# Patient Record
Sex: Female | Born: 1940 | Race: White | Hispanic: No | Marital: Married | Smoking: Never smoker
Health system: Southern US, Community
[De-identification: ages and names within clinical notes are randomized; demographics above are authoritative.]

## PROBLEM LIST (undated history)

## (undated) DIAGNOSIS — T386X5A Adverse effect of antigonadotrophins, antiestrogens, antiandrogens, not elsewhere classified, initial encounter: Secondary | ICD-10-CM

## (undated) DIAGNOSIS — R0602 Shortness of breath: Secondary | ICD-10-CM

## (undated) DIAGNOSIS — E559 Vitamin D deficiency, unspecified: Secondary | ICD-10-CM

## (undated) DIAGNOSIS — M818 Other osteoporosis without current pathological fracture: Secondary | ICD-10-CM

## (undated) DIAGNOSIS — IMO0002 Reserved for concepts with insufficient information to code with codable children: Secondary | ICD-10-CM

## (undated) DIAGNOSIS — E079 Disorder of thyroid, unspecified: Secondary | ICD-10-CM

## (undated) DIAGNOSIS — F329 Major depressive disorder, single episode, unspecified: Secondary | ICD-10-CM

## (undated) DIAGNOSIS — E039 Hypothyroidism, unspecified: Secondary | ICD-10-CM

## (undated) DIAGNOSIS — G4700 Insomnia, unspecified: Secondary | ICD-10-CM

## (undated) DIAGNOSIS — C50912 Malignant neoplasm of unspecified site of left female breast: Secondary | ICD-10-CM

## (undated) DIAGNOSIS — F419 Anxiety disorder, unspecified: Secondary | ICD-10-CM

## (undated) DIAGNOSIS — R5383 Other fatigue: Secondary | ICD-10-CM

## (undated) DIAGNOSIS — C7951 Secondary malignant neoplasm of bone: Secondary | ICD-10-CM

## (undated) DIAGNOSIS — R5382 Chronic fatigue, unspecified: Secondary | ICD-10-CM

## (undated) DIAGNOSIS — N1330 Unspecified hydronephrosis: Secondary | ICD-10-CM

## (undated) DIAGNOSIS — Z9221 Personal history of antineoplastic chemotherapy: Secondary | ICD-10-CM

## (undated) DIAGNOSIS — Z923 Personal history of irradiation: Secondary | ICD-10-CM

## (undated) DIAGNOSIS — F32A Depression, unspecified: Secondary | ICD-10-CM

## (undated) DIAGNOSIS — C787 Secondary malignant neoplasm of liver and intrahepatic bile duct: Secondary | ICD-10-CM

## (undated) DIAGNOSIS — IMO0001 Reserved for inherently not codable concepts without codable children: Secondary | ICD-10-CM

## (undated) HISTORY — DX: Personal history of antineoplastic chemotherapy: Z92.21

## (undated) HISTORY — DX: Insomnia, unspecified: G47.00

## (undated) HISTORY — DX: Reserved for concepts with insufficient information to code with codable children: IMO0002

## (undated) HISTORY — DX: Anxiety disorder, unspecified: F41.9

## (undated) HISTORY — DX: Disorder of thyroid, unspecified: E07.9

## (undated) HISTORY — DX: Major depressive disorder, single episode, unspecified: F32.9

## (undated) HISTORY — DX: Other osteoporosis without current pathological fracture: M81.8

## (undated) HISTORY — DX: Personal history of irradiation: Z92.3

## (undated) HISTORY — DX: Adverse effect of antigonadotrophins, antiestrogens, antiandrogens, not elsewhere classified, initial encounter: T38.6X5A

## (undated) HISTORY — DX: Chronic fatigue, unspecified: R53.82

## (undated) HISTORY — DX: Reserved for inherently not codable concepts without codable children: IMO0001

## (undated) HISTORY — DX: Depression, unspecified: F32.A

## (undated) HISTORY — DX: Vitamin D deficiency, unspecified: E55.9

---

## 1968-03-24 HISTORY — PX: CHOLECYSTECTOMY: SHX55

## 1998-07-23 ENCOUNTER — Other Ambulatory Visit: Admission: RE | Admit: 1998-07-23 | Discharge: 1998-07-23 | Payer: Self-pay | Admitting: Family Medicine

## 1999-07-16 ENCOUNTER — Other Ambulatory Visit: Admission: RE | Admit: 1999-07-16 | Discharge: 1999-07-16 | Payer: Self-pay | Admitting: Family Medicine

## 2000-08-13 ENCOUNTER — Other Ambulatory Visit: Admission: RE | Admit: 2000-08-13 | Discharge: 2000-08-13 | Payer: Self-pay | Admitting: Family Medicine

## 2000-08-24 ENCOUNTER — Other Ambulatory Visit: Admission: RE | Admit: 2000-08-24 | Discharge: 2000-08-24 | Payer: Self-pay

## 2000-08-27 ENCOUNTER — Encounter: Admission: RE | Admit: 2000-08-27 | Discharge: 2000-08-27 | Payer: Self-pay

## 2000-08-28 ENCOUNTER — Ambulatory Visit (HOSPITAL_BASED_OUTPATIENT_CLINIC_OR_DEPARTMENT_OTHER): Admission: RE | Admit: 2000-08-28 | Discharge: 2000-08-29 | Payer: Self-pay

## 2000-08-28 ENCOUNTER — Encounter (INDEPENDENT_AMBULATORY_CARE_PROVIDER_SITE_OTHER): Payer: Self-pay | Admitting: *Deleted

## 2000-08-28 HISTORY — PX: OTHER SURGICAL HISTORY: SHX169

## 2000-09-04 ENCOUNTER — Ambulatory Visit: Admission: RE | Admit: 2000-09-04 | Discharge: 2000-12-03 | Payer: Self-pay | Admitting: Radiation Oncology

## 2000-10-20 ENCOUNTER — Ambulatory Visit (HOSPITAL_BASED_OUTPATIENT_CLINIC_OR_DEPARTMENT_OTHER): Admission: RE | Admit: 2000-10-20 | Discharge: 2000-10-20 | Payer: Self-pay

## 2000-12-01 ENCOUNTER — Ambulatory Visit (HOSPITAL_COMMUNITY): Admission: RE | Admit: 2000-12-01 | Discharge: 2000-12-01 | Payer: Self-pay | Admitting: Oncology

## 2000-12-01 ENCOUNTER — Encounter: Payer: Self-pay | Admitting: Oncology

## 2000-12-21 ENCOUNTER — Ambulatory Visit: Admission: RE | Admit: 2000-12-21 | Discharge: 2001-03-21 | Payer: Self-pay | Admitting: Radiation Oncology

## 2001-07-06 ENCOUNTER — Encounter: Admission: RE | Admit: 2001-07-06 | Discharge: 2001-07-06 | Payer: Self-pay | Admitting: Oncology

## 2001-07-06 ENCOUNTER — Encounter: Payer: Self-pay | Admitting: Oncology

## 2002-01-06 ENCOUNTER — Encounter: Payer: Self-pay | Admitting: Oncology

## 2002-01-06 ENCOUNTER — Encounter: Admission: RE | Admit: 2002-01-06 | Discharge: 2002-01-06 | Payer: Self-pay | Admitting: Oncology

## 2002-06-23 ENCOUNTER — Encounter: Admission: RE | Admit: 2002-06-23 | Discharge: 2002-06-23 | Payer: Self-pay | Admitting: Oncology

## 2002-11-02 ENCOUNTER — Ambulatory Visit (HOSPITAL_COMMUNITY): Admission: RE | Admit: 2002-11-02 | Discharge: 2002-11-02 | Payer: Self-pay | Admitting: Oncology

## 2002-11-02 ENCOUNTER — Encounter: Payer: Self-pay | Admitting: Oncology

## 2002-11-09 ENCOUNTER — Ambulatory Visit: Admission: RE | Admit: 2002-11-09 | Discharge: 2002-11-09 | Payer: Self-pay | Admitting: Oncology

## 2003-01-11 ENCOUNTER — Ambulatory Visit (HOSPITAL_COMMUNITY): Admission: RE | Admit: 2003-01-11 | Discharge: 2003-01-11 | Payer: Self-pay | Admitting: Obstetrics and Gynecology

## 2003-01-11 ENCOUNTER — Encounter (INDEPENDENT_AMBULATORY_CARE_PROVIDER_SITE_OTHER): Payer: Self-pay

## 2003-01-11 HISTORY — PX: HYSTEROSCOPY W/D&C: SHX1775

## 2003-03-29 ENCOUNTER — Encounter: Admission: RE | Admit: 2003-03-29 | Discharge: 2003-03-29 | Payer: Self-pay | Admitting: Oncology

## 2003-06-28 ENCOUNTER — Encounter: Admission: RE | Admit: 2003-06-28 | Discharge: 2003-06-28 | Payer: Self-pay | Admitting: Oncology

## 2003-09-29 ENCOUNTER — Other Ambulatory Visit: Admission: RE | Admit: 2003-09-29 | Discharge: 2003-09-29 | Payer: Self-pay | Admitting: Obstetrics and Gynecology

## 2003-12-20 ENCOUNTER — Ambulatory Visit (HOSPITAL_COMMUNITY): Admission: RE | Admit: 2003-12-20 | Discharge: 2003-12-20 | Payer: Self-pay | Admitting: Oncology

## 2004-04-23 ENCOUNTER — Ambulatory Visit: Payer: Self-pay | Admitting: Oncology

## 2004-06-28 ENCOUNTER — Encounter: Admission: RE | Admit: 2004-06-28 | Discharge: 2004-06-28 | Payer: Self-pay | Admitting: Oncology

## 2004-08-16 ENCOUNTER — Ambulatory Visit: Payer: Self-pay | Admitting: Oncology

## 2004-08-29 ENCOUNTER — Encounter: Admission: RE | Admit: 2004-08-29 | Discharge: 2004-08-29 | Payer: Self-pay | Admitting: Obstetrics and Gynecology

## 2004-10-01 ENCOUNTER — Other Ambulatory Visit: Admission: RE | Admit: 2004-10-01 | Discharge: 2004-10-01 | Payer: Self-pay | Admitting: Obstetrics and Gynecology

## 2005-02-18 ENCOUNTER — Ambulatory Visit: Payer: Self-pay | Admitting: Oncology

## 2005-06-30 ENCOUNTER — Encounter: Admission: RE | Admit: 2005-06-30 | Discharge: 2005-06-30 | Payer: Self-pay | Admitting: Oncology

## 2005-07-23 ENCOUNTER — Encounter: Payer: Self-pay | Admitting: Oncology

## 2005-08-14 ENCOUNTER — Ambulatory Visit: Payer: Self-pay | Admitting: Oncology

## 2005-08-19 LAB — COMPREHENSIVE METABOLIC PANEL
ALT: 8 U/L (ref 0–40)
Albumin: 4.2 g/dL (ref 3.5–5.2)
CO2: 26 mEq/L (ref 19–32)
Calcium: 9.4 mg/dL (ref 8.4–10.5)
Chloride: 102 mEq/L (ref 96–112)
Creatinine, Ser: 0.8 mg/dL (ref 0.4–1.2)
Potassium: 4 mEq/L (ref 3.5–5.3)
Sodium: 140 mEq/L (ref 135–145)
Total Protein: 6.2 g/dL (ref 6.0–8.3)

## 2005-08-19 LAB — CBC WITH DIFFERENTIAL/PLATELET
BASO%: 0.2 % (ref 0.0–2.0)
HCT: 36.3 % (ref 34.8–46.6)
LYMPH%: 27 % (ref 14.0–48.0)
MCH: 31.1 pg (ref 26.0–34.0)
MCHC: 34.2 g/dL (ref 32.0–36.0)
MCV: 91 fL (ref 81.0–101.0)
MONO%: 6.8 % (ref 0.0–13.0)
NEUT%: 64.8 % (ref 39.6–76.8)
Platelets: 200 10*3/uL (ref 145–400)
RBC: 3.99 10*6/uL (ref 3.70–5.32)

## 2005-08-19 LAB — TSH: TSH: 0.545 u[IU]/mL (ref 0.350–5.500)

## 2005-08-27 ENCOUNTER — Encounter: Admission: RE | Admit: 2005-08-27 | Discharge: 2005-08-27 | Payer: Self-pay | Admitting: Oncology

## 2006-03-02 ENCOUNTER — Ambulatory Visit: Payer: Self-pay | Admitting: Oncology

## 2006-03-04 LAB — COMPREHENSIVE METABOLIC PANEL
AST: 16 U/L (ref 0–37)
BUN: 15 mg/dL (ref 6–23)
Calcium: 8.7 mg/dL (ref 8.4–10.5)
Chloride: 103 mEq/L (ref 96–112)
Creatinine, Ser: 0.8 mg/dL (ref 0.40–1.20)
Glucose, Bld: 126 mg/dL — ABNORMAL HIGH (ref 70–99)

## 2006-03-04 LAB — CBC WITH DIFFERENTIAL/PLATELET
Basophils Absolute: 0 10*3/uL (ref 0.0–0.1)
EOS%: 3.1 % (ref 0.0–7.0)
Eosinophils Absolute: 0.2 10*3/uL (ref 0.0–0.5)
HCT: 37.7 % (ref 34.8–46.6)
HGB: 12.6 g/dL (ref 11.6–15.9)
MCH: 30.1 pg (ref 26.0–34.0)
MCV: 89.9 fL (ref 81.0–101.0)
NEUT%: 71.1 % (ref 39.6–76.8)
lymph#: 1.7 10*3/uL (ref 0.9–3.3)

## 2006-07-02 ENCOUNTER — Encounter: Admission: RE | Admit: 2006-07-02 | Discharge: 2006-07-02 | Payer: Self-pay | Admitting: Oncology

## 2006-08-30 ENCOUNTER — Ambulatory Visit: Payer: Self-pay | Admitting: Oncology

## 2006-09-02 LAB — COMPREHENSIVE METABOLIC PANEL
ALT: 9 U/L (ref 0–35)
AST: 14 U/L (ref 0–37)
CO2: 25 mEq/L (ref 19–32)
Creatinine, Ser: 0.83 mg/dL (ref 0.40–1.20)
Sodium: 140 mEq/L (ref 135–145)
Total Bilirubin: 0.5 mg/dL (ref 0.3–1.2)
Total Protein: 6.4 g/dL (ref 6.0–8.3)

## 2006-09-02 LAB — CBC WITH DIFFERENTIAL/PLATELET
BASO%: 0.4 % (ref 0.0–2.0)
EOS%: 1.5 % (ref 0.0–7.0)
LYMPH%: 30.5 % (ref 14.0–48.0)
MCH: 31.1 pg (ref 26.0–34.0)
MCHC: 34.8 g/dL (ref 32.0–36.0)
MONO#: 0.4 10*3/uL (ref 0.1–0.9)
Platelets: 205 10*3/uL (ref 145–400)
RBC: 4.26 10*6/uL (ref 3.70–5.32)
WBC: 5.2 10*3/uL (ref 3.9–10.0)
lymph#: 1.6 10*3/uL (ref 0.9–3.3)

## 2006-10-05 ENCOUNTER — Other Ambulatory Visit: Admission: RE | Admit: 2006-10-05 | Discharge: 2006-10-05 | Payer: Self-pay | Admitting: Obstetrics and Gynecology

## 2007-02-26 ENCOUNTER — Ambulatory Visit: Payer: Self-pay | Admitting: Oncology

## 2007-03-02 LAB — COMPREHENSIVE METABOLIC PANEL
ALT: 9 U/L (ref 0–35)
AST: 15 U/L (ref 0–37)
Alkaline Phosphatase: 42 U/L (ref 39–117)
BUN: 14 mg/dL (ref 6–23)
Chloride: 104 mEq/L (ref 96–112)
Creatinine, Ser: 0.83 mg/dL (ref 0.40–1.20)
Total Bilirubin: 0.6 mg/dL (ref 0.3–1.2)

## 2007-03-02 LAB — CBC WITH DIFFERENTIAL/PLATELET
BASO%: 0.5 % (ref 0.0–2.0)
EOS%: 1.6 % (ref 0.0–7.0)
HCT: 37.3 % (ref 34.8–46.6)
LYMPH%: 30.9 % (ref 14.0–48.0)
MCH: 31.1 pg (ref 26.0–34.0)
MCHC: 34.4 g/dL (ref 32.0–36.0)
MCV: 90.5 fL (ref 81.0–101.0)
MONO#: 0.3 10*3/uL (ref 0.1–0.9)
MONO%: 6.5 % (ref 0.0–13.0)
NEUT%: 60.5 % (ref 39.6–76.8)
Platelets: 217 10*3/uL (ref 145–400)
RBC: 4.12 10*6/uL (ref 3.70–5.32)
WBC: 5.1 10*3/uL (ref 3.9–10.0)

## 2007-07-05 ENCOUNTER — Encounter: Admission: RE | Admit: 2007-07-05 | Discharge: 2007-07-05 | Payer: Self-pay | Admitting: Oncology

## 2007-08-12 ENCOUNTER — Ambulatory Visit: Payer: Self-pay | Admitting: Oncology

## 2007-08-30 ENCOUNTER — Encounter: Admission: RE | Admit: 2007-08-30 | Discharge: 2007-08-30 | Payer: Self-pay | Admitting: Oncology

## 2007-10-11 ENCOUNTER — Other Ambulatory Visit: Admission: RE | Admit: 2007-10-11 | Discharge: 2007-10-11 | Payer: Self-pay | Admitting: Obstetrics and Gynecology

## 2008-02-28 ENCOUNTER — Ambulatory Visit: Payer: Self-pay | Admitting: Oncology

## 2008-03-01 LAB — COMPREHENSIVE METABOLIC PANEL
AST: 13 U/L (ref 0–37)
Alkaline Phosphatase: 52 U/L (ref 39–117)
BUN: 15 mg/dL (ref 6–23)
Creatinine, Ser: 0.84 mg/dL (ref 0.40–1.20)

## 2008-03-01 LAB — CBC WITH DIFFERENTIAL/PLATELET
Basophils Absolute: 0 10*3/uL (ref 0.0–0.1)
EOS%: 2 % (ref 0.0–7.0)
Eosinophils Absolute: 0.1 10*3/uL (ref 0.0–0.5)
HGB: 13.1 g/dL (ref 11.6–15.9)
LYMPH%: 25.4 % (ref 14.0–48.0)
MCH: 30.7 pg (ref 26.0–34.0)
MCV: 90.3 fL (ref 81.0–101.0)
MONO%: 6.8 % (ref 0.0–13.0)
NEUT#: 3.2 10*3/uL (ref 1.5–6.5)
Platelets: 201 10*3/uL (ref 145–400)
RDW: 13.4 % (ref 11.3–14.5)

## 2008-07-05 ENCOUNTER — Encounter: Admission: RE | Admit: 2008-07-05 | Discharge: 2008-07-05 | Payer: Self-pay | Admitting: Oncology

## 2008-09-06 ENCOUNTER — Ambulatory Visit: Payer: Self-pay | Admitting: Oncology

## 2008-09-08 LAB — COMPREHENSIVE METABOLIC PANEL
AST: 15 U/L (ref 0–37)
Albumin: 4.1 g/dL (ref 3.5–5.2)
Alkaline Phosphatase: 52 U/L (ref 39–117)
BUN: 18 mg/dL (ref 6–23)
Glucose, Bld: 95 mg/dL (ref 70–99)
Potassium: 4.1 mEq/L (ref 3.5–5.3)
Sodium: 141 mEq/L (ref 135–145)
Total Bilirubin: 0.4 mg/dL (ref 0.3–1.2)
Total Protein: 6.5 g/dL (ref 6.0–8.3)

## 2008-09-08 LAB — CBC WITH DIFFERENTIAL/PLATELET
EOS%: 1.8 % (ref 0.0–7.0)
LYMPH%: 23.1 % (ref 14.0–49.7)
MCH: 31.8 pg (ref 25.1–34.0)
MCV: 91.6 fL (ref 79.5–101.0)
MONO%: 6.5 % (ref 0.0–14.0)
Platelets: 220 10*3/uL (ref 145–400)
RBC: 4.05 10*6/uL (ref 3.70–5.45)
RDW: 13.8 % (ref 11.2–14.5)

## 2009-03-02 ENCOUNTER — Ambulatory Visit: Payer: Self-pay | Admitting: Oncology

## 2009-03-06 LAB — CBC WITH DIFFERENTIAL/PLATELET
Basophils Absolute: 0 10*3/uL (ref 0.0–0.1)
Eosinophils Absolute: 0.1 10*3/uL (ref 0.0–0.5)
HGB: 13.3 g/dL (ref 11.6–15.9)
MONO#: 0.4 10*3/uL (ref 0.1–0.9)
NEUT#: 3.3 10*3/uL (ref 1.5–6.5)
RBC: 4.22 10*6/uL (ref 3.70–5.45)
RDW: 13.5 % (ref 11.2–14.5)
WBC: 5.1 10*3/uL (ref 3.9–10.3)
lymph#: 1.3 10*3/uL (ref 0.9–3.3)

## 2009-03-07 LAB — COMPREHENSIVE METABOLIC PANEL
ALT: 11 U/L (ref 0–35)
AST: 14 U/L (ref 0–37)
Albumin: 4.4 g/dL (ref 3.5–5.2)
Alkaline Phosphatase: 54 U/L (ref 39–117)
BUN: 15 mg/dL (ref 6–23)
Calcium: 9.7 mg/dL (ref 8.4–10.5)
Chloride: 104 mEq/L (ref 96–112)
Potassium: 4 mEq/L (ref 3.5–5.3)
Sodium: 142 mEq/L (ref 135–145)
Total Protein: 6.4 g/dL (ref 6.0–8.3)

## 2009-07-06 ENCOUNTER — Encounter: Admission: RE | Admit: 2009-07-06 | Discharge: 2009-07-06 | Payer: Self-pay | Admitting: Oncology

## 2009-07-10 ENCOUNTER — Encounter: Admission: RE | Admit: 2009-07-10 | Discharge: 2009-07-10 | Payer: Self-pay | Admitting: Oncology

## 2009-08-30 ENCOUNTER — Ambulatory Visit: Payer: Self-pay | Admitting: Oncology

## 2009-09-03 LAB — CBC WITH DIFFERENTIAL/PLATELET
Basophils Absolute: 0 10*3/uL (ref 0.0–0.1)
EOS%: 2.4 % (ref 0.0–7.0)
HGB: 12.9 g/dL (ref 11.6–15.9)
MCH: 30.9 pg (ref 25.1–34.0)
NEUT#: 3.4 10*3/uL (ref 1.5–6.5)
RDW: 13.3 % (ref 11.2–14.5)
lymph#: 1.3 10*3/uL (ref 0.9–3.3)

## 2009-09-03 LAB — COMPREHENSIVE METABOLIC PANEL
ALT: 9 U/L (ref 0–35)
AST: 15 U/L (ref 0–37)
Albumin: 4.1 g/dL (ref 3.5–5.2)
BUN: 17 mg/dL (ref 6–23)
Calcium: 9.7 mg/dL (ref 8.4–10.5)
Chloride: 104 mEq/L (ref 96–112)
Potassium: 4.3 mEq/L (ref 3.5–5.3)
Total Protein: 6.2 g/dL (ref 6.0–8.3)

## 2010-01-09 ENCOUNTER — Encounter: Admission: RE | Admit: 2010-01-09 | Discharge: 2010-01-09 | Payer: Self-pay | Admitting: Oncology

## 2010-03-04 ENCOUNTER — Ambulatory Visit: Payer: Self-pay | Admitting: Oncology

## 2010-03-05 LAB — CBC WITH DIFFERENTIAL/PLATELET
Basophils Absolute: 0 10*3/uL (ref 0.0–0.1)
Eosinophils Absolute: 0.1 10*3/uL (ref 0.0–0.5)
HGB: 13.2 g/dL (ref 11.6–15.9)
MCV: 92.3 fL (ref 79.5–101.0)
MONO#: 0.4 10*3/uL (ref 0.1–0.9)
MONO%: 5.6 % (ref 0.0–14.0)
NEUT#: 4.4 10*3/uL (ref 1.5–6.5)
RDW: 13.3 % (ref 11.2–14.5)

## 2010-03-05 LAB — COMPREHENSIVE METABOLIC PANEL
Albumin: 4.4 g/dL (ref 3.5–5.2)
Alkaline Phosphatase: 63 U/L (ref 39–117)
BUN: 18 mg/dL (ref 6–23)
CO2: 26 mEq/L (ref 19–32)
Calcium: 9.6 mg/dL (ref 8.4–10.5)
Chloride: 102 mEq/L (ref 96–112)
Glucose, Bld: 121 mg/dL — ABNORMAL HIGH (ref 70–99)
Potassium: 4 mEq/L (ref 3.5–5.3)

## 2010-03-20 ENCOUNTER — Encounter
Admission: RE | Admit: 2010-03-20 | Discharge: 2010-03-20 | Payer: Self-pay | Source: Home / Self Care | Attending: Internal Medicine | Admitting: Internal Medicine

## 2010-04-13 ENCOUNTER — Other Ambulatory Visit: Payer: Self-pay | Admitting: Oncology

## 2010-04-13 DIAGNOSIS — Z1231 Encounter for screening mammogram for malignant neoplasm of breast: Secondary | ICD-10-CM

## 2010-07-12 ENCOUNTER — Ambulatory Visit
Admission: RE | Admit: 2010-07-12 | Discharge: 2010-07-12 | Disposition: A | Discharge status: Home / Self Care | Payer: MEDICARE | Source: Ambulatory Visit | Attending: Oncology | Admitting: Oncology

## 2010-07-12 DIAGNOSIS — Z1231 Encounter for screening mammogram for malignant neoplasm of breast: Secondary | ICD-10-CM

## 2010-08-09 NOTE — Op Note (Signed)
[place]. [facility]  Patient:    Kelli [name], Kelli [name]                     MRN: [id number] Proc. Date: 10/20/00 Attending:  Zigmund Daniel, M.D.                           Operative Report  PREOPERATIVE DIAGNOSIS:  Left breast cancer.  POSTOPERATIVE DIAGNOSIS:  Left breast cancer.  OPERATION PERFORMED:  Implantation of a P.A.S. port via the right brachial vein.  SURGEON:  Zigmund Daniel, M.D.  ANESTHESIA:  Local sedation.  DESCRIPTION OF PROCEDURE:  After the patient was adequately monitored and sedated and had routine preparation and draping of the right arm, I made a short longitudinal incision in the medial distal arm and dissected down toward the brachial vein.  I encountered a nice straight sizable vein which was obviously patent.  I controlled it proximally and tied it off distally.  I made a short incision into the vein and passed the venous tubing in for an estimated necessary length to reach the superior vena cava.  I then used fluoroscopic control to position the tip at about the cavoatrial junction and I tied down on the proximal control to secure it in place.  I then made a small pocket adjacent to the incision and implanted the port with comfortable orientation on the anterior distal right arm.  I sutured that down with two sutures of 4-0 Vicryl and it seemed secure.  I then cut the tubing to the needed length and attached the tubing to the port.  It flushed easily and provided good return of blood.  I then flushed it with concentrated heparin solution.  I closed the incision with deeper subcutaneous 4-0 Vicryl and intercuticular 4-0 Vicryl and Steri-Strips.  The patient tolerated the procedure well.  Chest x-ray in PACU is pending. DD:  10/20/00 TD:  10/20/00 Job: [id number]

## 2010-08-09 NOTE — Op Note (Signed)
NAME:  Kelli [name], Kelli [name]                      ACCOUNT NO.:  [id number]   MEDICAL RECORD NO.:  [id number]                   PATIENT TYPE:  AMB   LOCATION:  SDC                                  FACILITY:  [facility]   PHYSICIAN:  Artist Pais, M.D.                 DATE OF BIRTH:  Jul 14, 1940   DATE OF PROCEDURE:  01/11/2003  DATE OF DISCHARGE:                                 OPERATIVE REPORT   PREOPERATIVE DIAGNOSIS:  Endometrial polyp seen on sonohysterogram.   POSTOPERATIVE DIAGNOSIS:  Endometrial polyp seen on sonohysterogram.  The  patient was found to have a small polypoid area at the junction of the  internal os and lower uterine segment, which was removed in its entirety.   PROCEDURES:  1. Dilatation and curettage.  2. Hysteroscopy.  3. Polypectomy.   SURGEON:  Artist Pais, M.D.   ANESTHESIA:  General by LMA plus 20 mL 1% lidocaine paracervical block for  postoperative comfort.   ESTIMATED BLOOD LOSS:  Minimal.   FLUIDS REPLACED:  1700 mL of crystalloid.   COMPLICATIONS:  None.   DRAINS:  None.   DESCRIPTION OF OPERATION:  The patient was brought to the operating room,  identified on the operating room table.  After induction of adequate general  anesthesia by LMA, the patient was placed in the dorsal lithotomy position  and prepped and draped in the usual sterile fashion.  The bladder was  straight-catheterized for just a few drops of clear yellow urine.  A  bimanual examination revealed the uterus to be retroverted and small and  mobile.  A speculum was placed and the posterior lip of the cervix was  infiltrated with 1 mL of 1% lidocaine and then grasped with a single-tooth  tenaculum.  The remaining 19 mL of lidocaine was placed for a paracervical  block.  The cervix was then very gently dilated up to a #25 Pratt dilator  without any difficulty.  Dilatation proceeded very carefully in order to  decrease the risk of uterine perforation.  The os was noted to  deviate  slightly to the left but dilatation proceeded very gently.  The hysteroscope  was placed and using sorbitol as a distending medium, a careful and thorough  hysteroscopic examination was performed.  The entire endometrium appeared  thin, and there was noted to be a small polypoid area at the junction of the  internal os and the lower uterine segment.  At that point the scope was  withdrawn and the endometrium was curetted in a systematic clockwise  fashion.  This was done very carefully to decrease the risk of perforation.  I was able to remove the polyp in its entirety with the serrated curette,  and this was identified when it was removed.  The Randall stone forceps were  placed and additional tissue was obtained.  Entire endometrial cavity was  curetted until a good cry was heard all around.  I did not re-examine the  endometrium as it was noted to be very thin and I had already identified the  polyp upon removal and there had been no additional tissue noted on  hysteroscopy.  It was this surgeon's opinion that to continue to instrument  the patient would increase the risk of a possible complication and was not  deemed to be necessary as the polyp was identified.  At that point the  procedure was then terminated.  The tenaculum was then removed.  I placed  pressure on the tenaculum site to decrease bleeding and subsequently the  procedure was terminated.  The patient was transferred to the recovery room  in stable condition after all instrument, sponge, and needle counts were  correct.  She was given a postoperative D&C instruction sheet and ordered to  return in two to three weeks for a postoperative evaluation.                                               Artist Pais, M.D.    DC/MEDQ  D:  01/11/2003  T:  01/12/2003  Job:  [id number]   cc:   Windle Guard, M.D.  [address]  [place], [place] [address]  Fax: [phone]   Lennis P. Darrold Span, M.D.  [address]  [facility]  [place]  [place] [address]  Fax: [phone]

## 2010-08-09 NOTE — Op Note (Signed)
[place]. [facility]  Patient:    Kelli [name], Kelli [name]                   MRN: [id number] Proc. Date: 08/28/00 Adm. Date:  [id number] Disc. Date: [id number] Attending:  Meredith Leeds                           Operative Report  PREOPERATIVE DIAGNOSIS:  Carcinoma of the left breast.  POSTOPERATIVE DIAGNOSIS:  Carcinoma of the left breast.  PROCEDURES: 1. Left partial mastectomy. 2. Sentinel lymph node biopsy.  SURGEON:  Zigmund Daniel, M.D.  ANESTHESIA:  General.  DESCRIPTION OF PROCEDURE:  After the patient had adequate monitoring and underwent general anesthesia and routine preparation and draping of the left breast and axilla and the right breast as well, I first excised a tiny cystic lesion from the skin and subcutaneous tissue of the right breast, got hemostasis, and closed the skin with intracuticular 4-0 Vicryl.  I did not reapproach that area thereafter.  Prior to patient having the prep, I had injected lymphazurin blue dye in the subareolar portion of the left breast. Using the gamma probe, I found a hot spot in the low axilla and made a short incision over it and dissected down to the area and found a couple of lymph nodes which had increased radioactivity and which had blue dye in them.  I excised them and found that the background radiation was then present, and there seemed to be no other hot spots.  I proceeded with the partial mastectomy while the lymph nodes were analyzed.  I made a circumareolar incision paralleling the border of the areola but out from it, immediately over the palpable tumor in the upper outer quadrant, dissected down into the subcutaneous tissues, and developed the skin and subcutaneous flaps for several centimeters in all directions over the tumor.  I then deepened my dissection down to the pectoral fascia at all spots and excised most of the tissue of the upper outer quadrant of the left breast.  I got  hemostasis with the cautery.  I marked the specimen with sutures to orient it for the pathologist and then closed the skin with staples.  While I was performing that part of the operation, I got the report back from Dr. Debby Bud in pathology, who said that there were malignant cells in one of the lymph nodes and atypical cells in the other.  I then decided to perform an axillary lymphadenectomy.  I enlarged the axillary incision, deepened the dissection down to the chest wall in the upper outer quadrant.  I dissect up along the lateral border of the pectoralis major and encountered the axillary vein and freed the axillary contents up from the superficial axillary skin.  I clipped and divided a couple of tributaries of the axillary vein.  Then I identified and spared the long thoracic and thoracodorsal nerves and swept all the axillary contents downward.  I sacrificed the lateral intercostal brachial nerve and a small cutaneous nerve to the arm.  I stuck close to the chest wall but made sure that I did not injure the long thoracic nerve as I took out the axillary contents.  I did palpate a couple of somewhat firm lymph nodes in the axilla.  After removal of the axillary contents, I got good hemostasis with the cautery, irrigated the area, placed a suction drain through a separate stab incision, and closed  the incision with a layer of 3-0 Vicryl and closed the skin with staples.  She tolerated the operation well. DD:  08/28/00 TD:  08/30/00 Job: [id number]

## 2010-08-09 NOTE — H&P (Signed)
NAME:  Kelli [name], Kelli [name]                      ACCOUNT NO.:  [id number]   MEDICAL RECORD NO.:  [id number]                   PATIENT TYPE:  AMB   LOCATION:  SDC                                  FACILITY:  [facility]   PHYSICIAN:  Artist Pais, M.D.                 DATE OF BIRTH:  Jan 02, 1941   DATE OF ADMISSION:  01/11/2003  DATE OF DISCHARGE:                                HISTORY & PHYSICAL   CURRENT HISTORY:  The patient is a 70 year old Caucasian female, who is  admitted for a D&C hysteroscopy and polypectomy.  She was diagnosed with  left breast cancer in June 2002, which was [facility] receptor positive.  Her  breast cancer was treated with chemotherapy and radiation therapy.  She  remains on Tamoxifen.  She has not had any bleeding.   I did offer her a pelvic ultrasound, explaining that if the patient has one  glandular cancer she could have others.  She subsequently underwent a pelvic  ultrasound, which showed cystic areas within the endometrial cavity --  consistent with Tamoxifen use.  She is also found to have small fibroids.   Subsequently, she underwent a sonohystogram.  This showed a 7 x 6 mm  echogenic focus in the posterior wall of the endometrium, consistent with a  likely polyp.  She was advised to undergo a D&C hysteroscopy and  polypectomy.  Risks of surgery, including anesthetic complication,  hemorrhage, infection, damage to adjacent structures (including bladder,  bowel, blood vessels or ureters) were discussed with the patient.  She was  made aware of the risks of uterine perforation, which could result in  overwhelming life threatening hemorrhage requiring emergent hysterectomy;  or, uterine perforation which could result in damage, which could require an  emergent colostomy or which could result in overwhelming life-threatening  peritonitis.  She expressed understanding of, and acceptance of, these  risks.  She desires to proceed with surgery.   I did call Dr. Windle Guard, who is her primary care physician, to ensure  that she could be surgically cleared for this procedure.  But, he has not  seen her for two years.  When I have seen her in the office she continues to  tell me he is her primary care physician.  She saw him for a viral illness,  but has not seen him for a physical.  I did speak with the anesthesiologist  at [facility], and they will interview her to ensure that this would  be safe to perform.  This is a minor surgery, and she has no significant  health problems.   OBSTETRICAL AND GYNECOLOGIC HISTORY:  No postmenopausal bleeding.  On  Tamoxifen.   PAST MEDICAL HISTORY:  1. Depression.  2. Hypothyroidism, managed by Dr. Romero Belling.  3. History of left breast cancer.  4. Insomnia.  5. Menopausal symptoms.  6. Lichen sclerosis.   ALLERGIES:  SULFA, MYCINS.  CURRENT MEDICATIONS:  1. Ambien.  2. Ativan.  3. Catapres patch for hot flashes.  4. Calcium.  5. Synthroid.  6. Tamoxifen.  7. Prozac.   SURGERIES:  1. Cholecystectomy.  2. Lumpectomy.  3. Placement of Port-a-Cath July 2002.   FAMILY HISTORY:  There is no family history of colon, breast, ovarian or  prostate cancer.  The patient's mother is 67.  Father died at 84 of a CVA.  She has one sister, age 76, with hypertension, heart disease, diabetes and a  CVA.  One sister, age 65, with  diabetes.  Another sister, age 68, alive and  well.  She has two children, ages 78 and 64, alive and well.   SOCIAL HISTORY:  The patient is retired.  She does not smoke or drink.   REVIEW OF SYSTEMS:  Noncontributory, except as noted above.  Denies  headaches, visual changes, chest pain, shortness of breath, abdominal pain,  change in bowel habits, unintentional weight loss, dysuria, urgency or  frequency, vaginal pruritus or discharge, pain or bleeding with intercourse.   PHYSICAL EXAMINATION:  VITAL SIGNS:  Blood pressure 130/82, heart rate 72,  weight 136.  HEENT:   Normal.  NECK:  Supple without thyromegaly.  LUNGS:  Clear to auscultation.  CARDIAC:  Regular rate and rhythm.  ABDOMEN:  Soft and nontender, no hepatosplenomegaly or masses.  BREASTS:  Right breast without masses.  No nodes, nipple retraction or  discharge.  Left breast with a well-healed biopsy scar.  EXTREMITIES:  No clubbing, cyanosis or edema.  NEUROLOGIC:  Oriented x3.  PELVIC:  Grossly normal.  Normal external female genitalia.  No vulvar,  vaginal or cervical lesions.  Pap smear cytobrush was performed and found to  be within normal limits except _________.  Bimanual examination reveals the  uterus to be normal, mobile and nontender; without any adnexal masses  palpated.  RECTAL:  Reveals no masses.  Excellent sphincter tone.   LABORATORY DATA:  Please see laboratory data in the chart.   IMPRESSION AND PLAN:  The patient is a 70 year old Caucasian female, who  appears to have an endometrial polyp on sonohystogram -- after undergoing a  routine pelvic ultrasound.  She is admitted for a D&C hysteroscopy and  polypectomy.  The risks and benefits have been explained to her, and she  expresses understanding of and acceptance of these risks.                                               Artist Pais, M.D.    DC/MEDQ  D:  01/11/2003  T:  01/11/2003  Job:  [id number]

## 2011-03-12 ENCOUNTER — Telehealth: Payer: Self-pay | Admitting: Oncology

## 2011-03-12 NOTE — Telephone Encounter (Signed)
gve the pt her jan 2013 appt calendar °

## 2011-04-11 ENCOUNTER — Other Ambulatory Visit: Payer: Medicare Other | Admitting: Lab

## 2011-04-11 ENCOUNTER — Ambulatory Visit: Payer: Medicare Other | Admitting: Oncology

## 2011-04-22 ENCOUNTER — Telehealth: Payer: Self-pay | Admitting: Oncology

## 2011-04-22 ENCOUNTER — Ambulatory Visit (HOSPITAL_BASED_OUTPATIENT_CLINIC_OR_DEPARTMENT_OTHER): Payer: Medicare Other | Admitting: Lab

## 2011-04-22 ENCOUNTER — Other Ambulatory Visit: Payer: Self-pay

## 2011-04-22 ENCOUNTER — Ambulatory Visit (HOSPITAL_BASED_OUTPATIENT_CLINIC_OR_DEPARTMENT_OTHER): Payer: Medicare Other | Admitting: Oncology

## 2011-04-22 VITALS — BP 122/75 | HR 77 | Temp 98.0°F | Ht 64.0 in | Wt 140.6 lb

## 2011-04-22 DIAGNOSIS — Z9221 Personal history of antineoplastic chemotherapy: Secondary | ICD-10-CM

## 2011-04-22 DIAGNOSIS — E039 Hypothyroidism, unspecified: Secondary | ICD-10-CM

## 2011-04-22 DIAGNOSIS — G4700 Insomnia, unspecified: Secondary | ICD-10-CM

## 2011-04-22 DIAGNOSIS — Z1231 Encounter for screening mammogram for malignant neoplasm of breast: Secondary | ICD-10-CM

## 2011-04-22 DIAGNOSIS — Z923 Personal history of irradiation: Secondary | ICD-10-CM

## 2011-04-22 DIAGNOSIS — M949 Disorder of cartilage, unspecified: Secondary | ICD-10-CM

## 2011-04-22 DIAGNOSIS — C50919 Malignant neoplasm of unspecified site of unspecified female breast: Secondary | ICD-10-CM

## 2011-04-22 DIAGNOSIS — Z853 Personal history of malignant neoplasm of breast: Secondary | ICD-10-CM

## 2011-04-22 DIAGNOSIS — C50419 Malignant neoplasm of upper-outer quadrant of unspecified female breast: Secondary | ICD-10-CM

## 2011-04-22 LAB — CBC WITH DIFFERENTIAL/PLATELET
BASO%: 0.4 % (ref 0.0–2.0)
MCHC: 33.8 g/dL (ref 31.5–36.0)
MONO#: 0.5 10*3/uL (ref 0.1–0.9)
RBC: 3.76 10*6/uL (ref 3.70–5.45)
RDW: 15.5 % — ABNORMAL HIGH (ref 11.2–14.5)
WBC: 7.4 10*3/uL (ref 3.9–10.3)
lymph#: 1.9 10*3/uL (ref 0.9–3.3)

## 2011-04-22 LAB — COMPREHENSIVE METABOLIC PANEL
ALT: <8 U/L (ref 0–35)
CO2: 27 mEq/L (ref 19–32)
Calcium: 9.5 mg/dL (ref 8.4–10.5)
Chloride: 104 mEq/L (ref 96–112)
Sodium: 140 mEq/L (ref 135–145)
Total Bilirubin: 0.4 mg/dL (ref 0.3–1.2)
Total Protein: 6.3 g/dL (ref 6.0–8.3)

## 2011-04-22 MED ORDER — FLUOXETINE HCL 20 MG PO CAPS: 20.0000 mg | ORAL_CAPSULE | Freq: Every day | ORAL | Status: DC

## 2011-04-22 MED ORDER — ZOLPIDEM TARTRATE 5 MG PO TABS: 5.0000 mg | ORAL_TABLET | Freq: Every evening | ORAL | Status: DC | PRN

## 2011-04-22 NOTE — Patient Instructions (Signed)
Your hemoglobin is still fine, just a little lower than it was Dec 2011. We will repeat blood counts in 6 months. Call Dr.Livesay's office for results if you don't hear back from [place] within a few days of that lab work.  Mammogram late April

## 2011-04-22 NOTE — Progress Notes (Signed)
OFFICE PROGRESS NOTE Date of Visit  04-22-2011 Physicians:W.Elkins, M.Altheimer, J.Beekman, J.Rendell  INTERVAL HISTORY:   Patient is seen, together with husband, in yearly follow up of her history of breast cancer. History is of 13 node positive left breast cancer in June 2002, treated with lumpectomy and axillary node evaluation, adjuvant adriamycin/cytoxan followed by [facility] chemotherapy, local radiation, tamoxifen from April 2003 thru Dec 2004 and arimidex from Dec 2004 thru June 2011. She has not had known active disease since completiion of that treatment. Last mammograms were at [facility] 07-12-2010 and will be due again 06-2011. She has osteoporosis by bone density scan April 2011. Patient tells me that she has been doing well since she was here last, with no complaints that seem referable to the breast cancer history or that treatment. She is now followed by Dr.Altheimer for endocrine, with recent adjustment in her thyroid replacement which has seemed helpful. She does not have regular follow up with primary physician Dr.Elkins. She had cortisone injection to knee by Dr.Beekman x1, helpful at that time. She recalls that she had colonoscopy by [facility] in last few years that was fine. She has not wanted flu shot, offered and refused again now. She has had viral type URI recently which is nearly resolved. She has no lower respiratory symptoms, no new or different pain, good appetite, energy better on present thyroid, no noted bleeding, no change in bowels, no noted change in breast self exam. Remainder of 10 point Review of Systems negative.  Patient's mother is now 12 years old, in nursing facility.  Objective:  Vital signs in last 24 hours:  BP 122/75  Pulse 77  Temp(Src) 98 F (36.7 C) (Oral)  Ht 5\' 4"  (1.626 m)  Wt 140 lb 9.6 oz (63.776 kg)  BMI 24.13 kg/m2 Easily mobile, looks comfortable, very pleasant as always.    HEENT:mucous membranes moist, pharynx normal without  lesions.PERRL.  LymphaticsCervical, supraclavicular, and axillary nodes normal.No inguinal adenopathy Resp: clear to auscultation bilaterally and normal percussion bilaterally. Back nontender. Cardio: regular rate and rhythm GI: soft, non-tender; bowel sounds normal; no masses,  no organomegaly Extremities: extremities normal, atraumatic, no cyanosis or edema Neuro:no sensory deficits noted Breasts: left lumpectomy scar in superior breast unremarkable, otherwise bilaterally no dominant masses, no skin changes of concern, no nipple changes, nothing in either axilla, no swelling LUE. Skin not pale.  Lab Results:   Basename 04/22/11 1121  WBC 7.4  HGB 11.8  HCT 34.8  PLT 168  ANC 4.8. MCV 92.5, RDW 15.5,  Differential not remarkable  This hgb is down from 13.22 Feb 2010 and 12.30 August 2009 BMET CMET available after visit normal with exception of glucose of 101, including creat 0.85, normal LFTs, Tbili 0.4  Basename 04/22/11 1121  NA 140  K 4.1  CL 104  CO2 27  GLUCOSE 101*  BUN 14  CREATININE 0.85  CALCIUM 9.5    Studies/Results:  No results found.  Medications: I have reviewed the patient's current medications. I have refilled ambien and prozac, as she has not been to primary MD.  Assessment/Plan:  1.History of 13 node positive left breast cancer post treatment as above, now out 10 1/2 years from diagnosis and no known active disease. She will have mammograms in April and I will see her back in a year or sooner if needed 2.Slight decrease in hemoglobin, tho still in normal range and other counts fine: as she does not have scheduled labs with other MDs known now,  I have set up repeat CBC in 6 months here. She seems to be up to date on colonoscopy and no other reason obvious from labs as above. 3.hypothyroidism on replacement        Emrik Erhard P, MD   04/22/2011, 7:30 PM

## 2011-04-22 NOTE — Telephone Encounter (Signed)
FAXED ORDER FORM FOR PROZAC TO [phone].  SENT A COPY OF SIGNED PRESCRIPTION TO BE SCANNED INTO PT.'S EMR.

## 2011-04-22 NOTE — Telephone Encounter (Signed)
Gv pt appts for july-jan2014.  scheduled pt for mammogram on 07/15/2011 @ [facility]

## 2011-07-09 ENCOUNTER — Encounter (HOSPITAL_COMMUNITY): Payer: Self-pay | Admitting: Emergency Medicine

## 2011-07-09 ENCOUNTER — Emergency Department (HOSPITAL_COMMUNITY)
Admission: EM | Admit: 2011-07-09 | Discharge: 2011-07-09 | Disposition: A | Discharge status: Home / Self Care | Payer: Medicare Other | Attending: Emergency Medicine | Admitting: Emergency Medicine

## 2011-07-09 DIAGNOSIS — X30XXXA Exposure to excessive natural heat, initial encounter: Secondary | ICD-10-CM | POA: Insufficient documentation

## 2011-07-09 DIAGNOSIS — R079 Chest pain, unspecified: Secondary | ICD-10-CM | POA: Insufficient documentation

## 2011-07-09 DIAGNOSIS — R55 Syncope and collapse: Secondary | ICD-10-CM | POA: Insufficient documentation

## 2011-07-09 DIAGNOSIS — T675XXA Heat exhaustion, unspecified, initial encounter: Secondary | ICD-10-CM

## 2011-07-09 DIAGNOSIS — R231 Pallor: Secondary | ICD-10-CM | POA: Insufficient documentation

## 2011-07-09 DIAGNOSIS — Z853 Personal history of malignant neoplasm of breast: Secondary | ICD-10-CM | POA: Insufficient documentation

## 2011-07-09 LAB — URINALYSIS, ROUTINE W REFLEX MICROSCOPIC
Bilirubin Urine: NEGATIVE
Hgb urine dipstick: NEGATIVE
Protein, ur: NEGATIVE mg/dL
Urobilinogen, UA: 1 mg/dL (ref 0.0–1.0)

## 2011-07-09 LAB — BASIC METABOLIC PANEL
GFR calc Af Amer: >90 mL/min (ref >90)
GFR calc non Af Amer: 82 mL/min — ABNORMAL LOW (ref >90)
Potassium: 4.1 mEq/L (ref 3.5–5.1)
Sodium: 139 mEq/L (ref 135–145)

## 2011-07-09 LAB — CBC
MCH: 29.8 pg (ref 26.0–34.0)
MCHC: 32.7 g/dL (ref 30.0–36.0)
Platelets: 165 10*3/uL (ref 150–400)
RBC: 3.62 MIL/uL — ABNORMAL LOW (ref 3.87–5.11)
RDW: 14.5 % (ref 11.5–15.5)

## 2011-07-09 LAB — DIFFERENTIAL
Basophils Absolute: 0 10*3/uL (ref 0.0–0.1)
Basophils Relative: 0 % (ref 0–1)
Eosinophils Absolute: 0.1 10*3/uL (ref 0.0–0.7)
Neutrophils Relative %: 78 % — ABNORMAL HIGH (ref 43–77)

## 2011-07-09 LAB — CK TOTAL AND CKMB (NOT AT ARMC)
CK, MB: 1.5 ng/mL (ref 0.3–4.0)
Total CK: 54 U/L (ref 7–177)

## 2011-07-09 MED ORDER — SODIUM CHLORIDE 0.9 % IV BOLUS (SEPSIS)
700.0000 mL | INTRAVENOUS | Status: AC
  Administered 2011-07-09: 700 mL via INTRAVENOUS

## 2011-07-09 MED ORDER — SODIUM CHLORIDE 0.9 % IV SOLN
INTRAVENOUS | Status: DC
  Administered 2011-07-09: 100 mL/h via INTRAVENOUS

## 2011-07-09 NOTE — ED Notes (Signed)
Bed:WA12<BR> Expected date:<BR> Expected time:<BR> Means of arrival:<BR> Comments:<BR> Hold per charge

## 2011-07-09 NOTE — ED Notes (Signed)
To ED via [facility] with c/o dizziness, with nausea this afternoon while working outside.

## 2011-07-09 NOTE — ED Notes (Signed)
Pt. Stated could not urinate. Will let me know when she is able.

## 2011-07-09 NOTE — Discharge Instructions (Signed)
Rest tomorrow.   Try to patient self when working outside. Try to drink a lot of fluids especially when it's hot. Also take frequent breaks when it's hot. Recheck if you feel worse again   Heat Stress in the Elderly  Elderly people (people aged 70 years and older) are more prone to heat stress than younger people for several reasons:   Elderly people do not adjust as well as young people to sudden changes in temperature.   They are more likely to have a chronic medical condition that upsets normal body responses to heat.   They are more likely to take prescription medicines that impair the body's ability to regulate its temperature or that inhibit perspiration.  HEAT STROKE  Heat stroke is the most serious heat-related illness. It occurs when the body becomes unable to control its temperature. The body temperature rises rapidly. Then the body loses its ability to sweat and is unable to cool down. The body temperature rises to 105 F (40.6 C) or higher within 10 to 15 minutes. Heat stroke can cause death or permanent disability if emergency treatment is not provided. SYMPTOMS  Warning signs vary but may include the following:  An extremely high body temperature (above 103 F (39.4 C)).   Nausea.   Red, hot, and dry skin (no sweating).   Rapid, strong pulse.   Throbbing headache.   Dizziness.  HEAT EXHAUSTION  Heat exhaustion is a milder form of heat-related illness. It can develop after several days of exposure to high temperatures and not enough fluids. SYMPTOMS  Warning signs vary but may include the following:   Heavy sweating. Paleness.   Muscle cramps.   Tiredness. Weakness.   Dizziness.   Headache. Nausea or vomiting.   Fainting.   Skin: may be cool and moist.   Pulse rate: fast and weak.   Breathing: fast and shallow.  WHAT YOU CAN DO TO PROTECT YOURSELF  You can follow these prevention tips to protect yourself from heat-related stress:   Drink cool,  nonalcoholic, non-caffeinated beverages. If your caregiver generally limits the amount of fluid you drink or has you on water pills, ask how much you should drink when the weather is hot. Avoid extremely cold liquids. They can cause cramps.   Rest.   Take a cool shower, bath, or sponge bath.   If possible, seek an air-conditioned environment. If you do not have air conditioning, visit an air-conditioned shopping mall or public library to cool off.   Financial risk analyst.   If possible, remain indoors in the heat of the day.   Do not engage in strenuous activities.  WHAT YOU CAN DO TO HELP PROTECT ELDERLY RELATIVES AND NEIGHBORS  If you have elderly relatives or neighbors, help them protect themselves from heat-related stress.   Visit older adults at risk at least twice a day. Watch them for signs of heat exhaustion or heat stroke.   Take them to air-conditioned locations if they have transportation problems.   Make sure older adults have access to an electric fan whenever possible.  WHAT YOU CAN DO FOR SOMEONE WITH HEAT STRESS   If you see any signs of severe heat stress, you may be dealing with a life-threatening emergency. Have someone call for immediate medical assistance while you begin cooling the affected person. Do the following:   Get the person to a shady area.   Cool the person rapidly, using whatever methods you can. For example, immerse the person in a  tub of cool water or place the person in a cool shower. Spray the person with cool water from a garden hose or sponge the person with cool water. If the humidity is low, wrap the person in a cool, wet sheet. Fan him/her quickly.   Monitor body temperature. Continue cooling efforts until the body temperature drops to 101 - 102F (38.3  C - 38.9  C).   If emergency medical personnel are delayed, call the hospital emergency room for further instructions.   Do not give the person alcohol to drink.   Get medical care  as soon as possible.  Document Released: 02/26/2009 Document Revised: 02/27/2011 Document Reviewed: 02/26/2009 [facility] Patient Information 2012 [place], [place].

## 2011-07-09 NOTE — ED Notes (Signed)
Pt states headache all day today

## 2011-07-09 NOTE — ED Notes (Signed)
Pt. Alert and  Oriented, NAD noted, discharged to home, pt. Ambulatory gait steady

## 2011-07-09 NOTE — ED Provider Notes (Signed)
History     CSN: [id number]  Arrival date & time 07/09/11  1815   First MD Initiated Contact with Patient 07/09/11 1835      Chief Complaint  Patient presents with  . Near Syncope  . Emesis  . Chest Pain    (Consider location/radiation/quality/duration/timing/severity/associated sxs/prior treatment) HPI  Patient relates she was outside today picking up bags of mulch and spreading them out of her right. She relates she was out about 45 minutes. She relates the back seems very heavy and she had been sweating a lot. After about 45 minutes she started seeing spots in her vision started seeing white lights. She states she was able to walk back to the house and sit and then lay down on the steps. She denies having loss of consciousness. She states she did get clammy and she denies chest pain but states she did have a soreness in her left lower costochondral region of her chest. She denies any tightness. She states she did feel short of breath. She vomited twice before she got to the ER. She denies abdominal pain. She states she did have a headache all day even before this started. She states nothing makes it feel worse, nothing makes it feel better. She states now she just has soreness in her chest and she feels weak. She relates she's also thirsty.  PCP Dr. Jeannetta Nap Oncologist Dr. Darrold Span  Past Medical History  Diagnosis Date  . Breast cancer JUNE OF 2002    LEFT BREAST  . Thyroid disease IN 1982    HX IODINE-131 ABLATION FOR HYPERTHYROIDISM  . Osteoporosis   . Vitamin D insufficiency   . Chronic fatigue   . Depression   . Anxiety   . Insomnia   . Stress     Past Surgical History  Procedure Date  . Cholecystectomy 1970  . Mastectomy partial / lumpectomy w/ axillary lymphadenectomy JUNE 2002    History reviewed. No pertinent family history. MOP alive at age 5, has CHF Sister died in 27's from stoke, [place] FOP died age 5 from stroke  History  Substance Use Topics  . Smoking  status: Never Smoker   . Smokeless tobacco: Not on file  . Alcohol Use: No  lives at home  OB History    Grav Para Term Preterm Abortions TAB SAB Ect Mult Living                  Review of Systems  All other systems reviewed and are negative.    Allergies  Other and Sulfa antibiotics  Home Medications   Current Outpatient Rx  Name Route Sig Dispense Refill  . CALCIUM CARBONATE-VITAMIN D 600-400 MG-UNIT PO TABS Oral Take 1 tablet by mouth 2 (two) times daily.    . CHOLECALCIFEROL 5000 UNITS PO TABS Oral Take 1 tablet by mouth daily.    Marland Kitchen FLUOXETINE HCL 20 MG PO CAPS Oral Take 1 capsule (20 mg total) by mouth daily. 90 capsule PRN  . COSAMIN DS PO Oral Take 1 tablet by mouth daily.     . IBUPROFEN 200 MG PO TABS Oral Take 400 mg by mouth every 8 (eight) hours as needed. For pain.    Marland Kitchen LEVOTHYROXINE SODIUM 75 MCG PO TABS Oral Take 75 mcg by mouth daily.    Marland Kitchen LORAZEPAM 1 MG PO TABS Oral Take 0.5-1 mg by mouth every 8 (eight) hours as needed. For anxiety.    Marland Kitchen VITAMIN B-12 1000 MCG PO TABS Oral Take  1,000 mcg by mouth daily.    Marland Kitchen ZOLPIDEM TARTRATE 5 MG PO TABS Oral Take 1 tablet (5 mg total) by mouth at bedtime as needed. 30 tablet 5    BP 127/53  Pulse 83  Temp(Src) 98.2 F (36.8 C) (Oral)  Resp 13  SpO2 100%  Vital signs normal    Physical Exam  Nursing note and vitals reviewed. Constitutional: She is oriented to person, place, and time. She appears well-developed and well-nourished.  Non-toxic appearance. She does not appear ill. No distress.  HENT:  Head: Normocephalic and atraumatic.  Right Ear: External ear normal.  Left Ear: External ear normal.  Nose: Nose normal. No mucosal edema or rhinorrhea.  Mouth/Throat: Mucous membranes are normal. No dental abscesses or uvula swelling.       Tongue dry  Eyes: Conjunctivae and EOM are normal. Pupils are equal, round, and reactive to light.  Neck: Normal range of motion and full passive range of motion without pain.  Neck supple.  Cardiovascular: Normal rate, regular rhythm and normal heart sounds.  Exam reveals no gallop and no friction rub.   No murmur heard. Pulmonary/Chest: Effort normal and breath sounds normal. No respiratory distress. She has no wheezes. She has no rhonchi. She has no rales. She exhibits no tenderness and no crepitus.  Abdominal: Soft. Normal appearance and bowel sounds are normal. She exhibits no distension. There is no tenderness. There is no rebound and no guarding.  Musculoskeletal: Normal range of motion. She exhibits no edema and no tenderness.       Moves all extremities well.   Neurological: She is alert and oriented to person, place, and time. She has normal strength. No cranial nerve deficit.  Skin: Skin is warm, dry and intact. No rash noted. No erythema. There is pallor.  Psychiatric: She has a normal mood and affect. Her speech is normal and behavior is normal. Her mood appears not anxious.    ED Course  Procedures (including critical care time)   Medications  0.9 %  sodium chloride infusion (100 mL/hr Intravenous New Bag/Given 07/09/11 2123)  sodium chloride 0.9 % bolus 700 mL (700 mL Intravenous Given 07/09/11 2032)   Pt is feeling better, is having good UO. Has been able to drink without problems.   Results for orders placed during the hospital encounter of 07/09/11  CBC      Component Value Range   WBC 10.9 (*) 4.0 - 10.5 (K/uL)   RBC 3.62 (*) 3.87 - 5.11 (MIL/uL)   Hemoglobin 10.8 (*) 12.0 - 15.0 (g/dL)   HCT 16.1 (*) 09.6 - 46.0 (%)   MCV 91.2  78.0 - 100.0 (fL)   MCH 29.8  26.0 - 34.0 (pg)   MCHC 32.7  30.0 - 36.0 (g/dL)   RDW 04.5  40.9 - 81.1 (%)   Platelets 165  150 - 400 (K/uL)  DIFFERENTIAL      Component Value Range   Neutrophils Relative 78 (*) 43 - 77 (%)   Neutro Abs 8.5 (*) 1.7 - 7.7 (K/uL)   Lymphocytes Relative 14  12 - 46 (%)   Lymphs Abs 1.6  0.7 - 4.0 (K/uL)   Monocytes Relative 7  3 - 12 (%)   Monocytes Absolute 0.8  0.1 - 1.0  (K/uL)   Eosinophils Relative 1  0 - 5 (%)   Eosinophils Absolute 0.1  0.0 - 0.7 (K/uL)   Basophils Relative 0  0 - 1 (%)   Basophils Absolute 0.0  0.0 - 0.1 (K/uL)  BASIC METABOLIC PANEL      Component Value Range   Sodium 139  135 - 145 (mEq/L)   Potassium 4.1  3.5 - 5.1 (mEq/L)   Chloride 102  96 - 112 (mEq/L)   CO2 25  19 - 32 (mEq/L)   Glucose, Bld 124 (*) 70 - 99 (mg/dL)   BUN 16  6 - 23 (mg/dL)   Creatinine, Ser 5.78  0.50 - 1.10 (mg/dL)   Calcium 9.2  8.4 - 46.9 (mg/dL)   GFR calc non Af Amer 82 (*) >90 (mL/min)   GFR calc Af Amer >90  >90 (mL/min)  TROPONIN I      Component Value Range   Troponin I <0.30  <0.30 (ng/mL)  CK TOTAL AND CKMB      Component Value Range   Total CK 54  7 - 177 (U/L)   CK, MB 1.5  0.3 - 4.0 (ng/mL)   Relative Index RELATIVE INDEX IS INVALID  0.0 - 2.5   URINALYSIS, ROUTINE W REFLEX MICROSCOPIC      Component Value Range   Color, Urine YELLOW  YELLOW    APPearance CLEAR  CLEAR    Specific Gravity, Urine 1.011  1.005 - 1.030    pH 8.5 (*) 5.0 - 8.0    Glucose, UA NEGATIVE  NEGATIVE (mg/dL)   Hgb urine dipstick NEGATIVE  NEGATIVE    Bilirubin Urine NEGATIVE  NEGATIVE    Ketones, ur 15 (*) NEGATIVE (mg/dL)   Protein, ur NEGATIVE  NEGATIVE (mg/dL)   Urobilinogen, UA 1.0  0.0 - 1.0 (mg/dL)   Nitrite NEGATIVE  NEGATIVE    Leukocytes, UA NEGATIVE  NEGATIVE   TROPONIN I      Component Value Range   Troponin I <0.30  <0.30 (ng/mL)   Laboratory interpretation all normal except mild anemia   Date: 07/09/2011  Rate: 82  Rhythm: normal sinus rhythm  QRS Axis: normal  Intervals: normal  ST/T Wave abnormalities: normal  Conduction Disutrbances:none  Narrative Interpretation:   Old EKG Reviewed: none available      1. Heat exhaustion     Plan discharge Devoria Albe, MD, FACEP   MDM          Ward Givens, MD 07/09/11 [phone]

## 2011-07-15 ENCOUNTER — Ambulatory Visit
Admission: RE | Admit: 2011-07-15 | Discharge: 2011-07-15 | Disposition: A | Discharge status: Home / Self Care | Payer: Medicare Other | Source: Ambulatory Visit | Attending: Oncology | Admitting: Oncology

## 2011-07-15 DIAGNOSIS — Z1231 Encounter for screening mammogram for malignant neoplasm of breast: Secondary | ICD-10-CM

## 2011-09-17 ENCOUNTER — Other Ambulatory Visit: Payer: Self-pay | Admitting: Internal Medicine

## 2011-09-17 DIAGNOSIS — M25559 Pain in unspecified hip: Secondary | ICD-10-CM

## 2011-09-19 ENCOUNTER — Ambulatory Visit
Admission: RE | Admit: 2011-09-19 | Discharge: 2011-09-19 | Disposition: A | Discharge status: Home / Self Care | Payer: Medicare Other | Source: Ambulatory Visit | Attending: Internal Medicine | Admitting: Internal Medicine

## 2011-09-19 DIAGNOSIS — M25559 Pain in unspecified hip: Secondary | ICD-10-CM

## 2011-09-19 MED ORDER — GADOBENATE DIMEGLUMINE 529 MG/ML IV SOLN
6.0000 mL | Freq: Once | INTRAVENOUS | Status: AC | PRN
  Administered 2011-09-19: 6 mL via INTRAVENOUS

## 2011-09-23 ENCOUNTER — Telehealth: Payer: Self-pay

## 2011-09-23 ENCOUNTER — Telehealth: Payer: Self-pay | Admitting: Oncology

## 2011-09-23 NOTE — Telephone Encounter (Signed)
Ms. Kelli [name] was calling to let Dr. Darrold Span Know that Ms. Kelli [name] was having left hip pain and did an MRI 09-19-11 that showed metastatic disease and an acute fracture through the bone lesion. Ms. Kelli [name] would like direction from Dr. Darrold Span as to weather  pt. needs orthopedic referral or other physician and follow up with Dr. Darrold Span.

## 2011-09-23 NOTE — Telephone Encounter (Signed)
Medical Oncology  Phone call from PA for Dr Alben Deeds, as their office saw her for new left hip and leg pain and did MRI left hip on 09-20-11 which shows pathologic left femoral neck fracture with multiple bony lesions thru pelvis, hips, LS. Patient reportedly has been in a great deal of pain. I recommended that they have her go to [facility] to be admitted, as orthopedics needs to see for possible surgical intervention, and she needs pain management and completion of work up for apparent metastatic disease. 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  Dr Shawnee Knapp office will call patient now.   Jama Flavors, MD

## 2011-09-24 ENCOUNTER — Inpatient Hospital Stay (HOSPITAL_COMMUNITY): Payer: Medicare Other

## 2011-09-24 ENCOUNTER — Encounter (HOSPITAL_COMMUNITY)
Admission: AD | Disposition: A | Discharge status: Home Health | Payer: Self-pay | Source: Ambulatory Visit | Attending: Oncology

## 2011-09-24 ENCOUNTER — Telehealth: Payer: Self-pay | Admitting: Oncology

## 2011-09-24 ENCOUNTER — Inpatient Hospital Stay (HOSPITAL_COMMUNITY): Payer: Medicare Other | Admitting: Certified Registered Nurse Anesthetist

## 2011-09-24 ENCOUNTER — Inpatient Hospital Stay (HOSPITAL_COMMUNITY)
Admission: AD | Admit: 2011-09-24 | Discharge: 2011-09-26 | DRG: 481 | Disposition: A | Discharge status: Home Health | Payer: Medicare Other | Source: Ambulatory Visit | Attending: Oncology | Admitting: Oncology

## 2011-09-24 ENCOUNTER — Encounter (HOSPITAL_COMMUNITY): Payer: Self-pay

## 2011-09-24 ENCOUNTER — Encounter: Payer: Self-pay | Admitting: *Deleted

## 2011-09-24 ENCOUNTER — Encounter (HOSPITAL_COMMUNITY): Payer: Self-pay | Admitting: Certified Registered Nurse Anesthetist

## 2011-09-24 DIAGNOSIS — M84553A Pathological fracture in neoplastic disease, unspecified femur, initial encounter for fracture: Secondary | ICD-10-CM

## 2011-09-24 DIAGNOSIS — C801 Malignant (primary) neoplasm, unspecified: Secondary | ICD-10-CM | POA: Insufficient documentation

## 2011-09-24 DIAGNOSIS — C7952 Secondary malignant neoplasm of bone marrow: Secondary | ICD-10-CM | POA: Diagnosis present

## 2011-09-24 DIAGNOSIS — C7951 Secondary malignant neoplasm of bone: Secondary | ICD-10-CM

## 2011-09-24 DIAGNOSIS — M810 Age-related osteoporosis without current pathological fracture: Secondary | ICD-10-CM | POA: Diagnosis present

## 2011-09-24 DIAGNOSIS — G4700 Insomnia, unspecified: Secondary | ICD-10-CM

## 2011-09-24 DIAGNOSIS — C50919 Malignant neoplasm of unspecified site of unspecified female breast: Secondary | ICD-10-CM

## 2011-09-24 DIAGNOSIS — M1710 Unilateral primary osteoarthritis, unspecified knee: Secondary | ICD-10-CM | POA: Diagnosis present

## 2011-09-24 DIAGNOSIS — Z853 Personal history of malignant neoplasm of breast: Secondary | ICD-10-CM

## 2011-09-24 DIAGNOSIS — C787 Secondary malignant neoplasm of liver and intrahepatic bile duct: Secondary | ICD-10-CM | POA: Diagnosis present

## 2011-09-24 DIAGNOSIS — N1330 Unspecified hydronephrosis: Secondary | ICD-10-CM | POA: Diagnosis present

## 2011-09-24 DIAGNOSIS — E039 Hypothyroidism, unspecified: Secondary | ICD-10-CM | POA: Diagnosis present

## 2011-09-24 DIAGNOSIS — D649 Anemia, unspecified: Secondary | ICD-10-CM

## 2011-09-24 DIAGNOSIS — M84453A Pathological fracture, unspecified femur, initial encounter for fracture: Principal | ICD-10-CM | POA: Diagnosis present

## 2011-09-24 DIAGNOSIS — M949 Disorder of cartilage, unspecified: Secondary | ICD-10-CM

## 2011-09-24 HISTORY — DX: Secondary malignant neoplasm of bone: C79.51

## 2011-09-24 HISTORY — PX: FEMUR IM NAIL: SHX1597

## 2011-09-24 HISTORY — DX: Secondary malignant neoplasm of liver and intrahepatic bile duct: C78.7

## 2011-09-24 LAB — COMPREHENSIVE METABOLIC PANEL
Alkaline Phosphatase: 218 U/L — ABNORMAL HIGH (ref 39–117)
BUN: 13 mg/dL (ref 6–23)
CO2: 26 mEq/L (ref 19–32)
Chloride: 100 mEq/L (ref 96–112)
Creatinine, Ser: 0.77 mg/dL (ref 0.50–1.10)
GFR calc non Af Amer: 83 mL/min — ABNORMAL LOW (ref >90)
Potassium: 3.6 mEq/L (ref 3.5–5.1)
Total Bilirubin: 0.4 mg/dL (ref 0.3–1.2)

## 2011-09-24 LAB — PROTIME-INR
INR: 1.2 (ref 0.00–1.49)
Prothrombin Time: 15.5 seconds — ABNORMAL HIGH (ref 11.6–15.2)

## 2011-09-24 LAB — DIFFERENTIAL
Basophils Absolute: 0 10*3/uL (ref 0.0–0.1)
Eosinophils Absolute: 0.1 10*3/uL (ref 0.0–0.7)
Eosinophils Relative: 1 % (ref 0–5)
Lymphs Abs: 3.9 10*3/uL (ref 0.7–4.0)
Neutrophils Relative %: 61 % (ref 43–77)

## 2011-09-24 LAB — URINALYSIS, ROUTINE W REFLEX MICROSCOPIC
Bilirubin Urine: NEGATIVE
Hgb urine dipstick: NEGATIVE
Ketones, ur: NEGATIVE mg/dL
Nitrite: NEGATIVE
Protein, ur: NEGATIVE mg/dL
Urobilinogen, UA: 1 mg/dL (ref 0.0–1.0)

## 2011-09-24 LAB — RETICULOCYTES: RBC.: 3.57 MIL/uL — ABNORMAL LOW (ref 3.87–5.11)

## 2011-09-24 LAB — CBC
MCH: 30.3 pg (ref 26.0–34.0)
MCV: 91.9 fL (ref 78.0–100.0)
Platelets: 206 10*3/uL (ref 150–400)
RBC: 3.57 MIL/uL — ABNORMAL LOW (ref 3.87–5.11)
RDW: 15.5 % (ref 11.5–15.5)
WBC: 12.6 10*3/uL — ABNORMAL HIGH (ref 4.0–10.5)

## 2011-09-24 SURGERY — INSERTION, INTRAMEDULLARY ROD, FEMUR
Anesthesia: Spinal | Laterality: Left | Wound class: Clean

## 2011-09-24 MED ORDER — DEXTROSE-NACL 5-0.9 % IV SOLN
INTRAVENOUS | Status: DC
  Administered 2011-09-24: 16:00:00 via INTRAVENOUS

## 2011-09-24 MED ORDER — BUPIVACAINE HCL (PF) 0.5 % IJ SOLN
INTRAMUSCULAR | Status: DC | PRN
  Administered 2011-09-24: 3 mL

## 2011-09-24 MED ORDER — DEXTROSE-NACL 5-0.9 % IV SOLN
INTRAVENOUS | Status: DC
  Administered 2011-09-25: 08:00:00 via INTRAVENOUS

## 2011-09-24 MED ORDER — ENOXAPARIN SODIUM 40 MG/0.4ML ~~LOC~~ SOLN
40.0000 mg | SUBCUTANEOUS | Status: DC
  Administered 2011-09-25 – 2011-09-26 (×2): 40 mg via SUBCUTANEOUS
  Filled 2011-09-24 (×2): qty 0.4

## 2011-09-24 MED ORDER — MUPIROCIN 2 % EX OINT
TOPICAL_OINTMENT | CUTANEOUS | Status: AC
  Administered 2011-09-24: 19:00:00
  Filled 2011-09-24: qty 22

## 2011-09-24 MED ORDER — IOHEXOL 300 MG/ML  SOLN
100.0000 mL | Freq: Once | INTRAMUSCULAR | Status: AC | PRN
  Administered 2011-09-24: 100 mL via INTRAVENOUS

## 2011-09-24 MED ORDER — ONDANSETRON HCL 4 MG/2ML IJ SOLN: 4.0000 mg | Freq: Four times a day (QID) | INTRAMUSCULAR | Status: DC | PRN

## 2011-09-24 MED ORDER — SENNA 8.6 MG PO TABS: 1.0000 | ORAL_TABLET | Freq: Two times a day (BID) | ORAL | Status: DC

## 2011-09-24 MED ORDER — MEPERIDINE HCL 50 MG/ML IJ SOLN: 6.2500 mg | INTRAMUSCULAR | Status: DC | PRN

## 2011-09-24 MED ORDER — CHOLECALCIFEROL 125 MCG (5000 UT) PO TABS: 1.0000 | ORAL_TABLET | Freq: Every day | ORAL | Status: DC

## 2011-09-24 MED ORDER — LEVOTHYROXINE SODIUM 75 MCG PO TABS
75.0000 ug | ORAL_TABLET | Freq: Every day | ORAL | Status: DC
  Administered 2011-09-25 – 2011-09-26 (×2): 75 ug via ORAL
  Filled 2011-09-24 (×4): qty 1

## 2011-09-24 MED ORDER — VITAMIN D3 25 MCG (1000 UNIT) PO TABS
5000.0000 [IU] | ORAL_TABLET | Freq: Every day | ORAL | Status: DC
  Administered 2011-09-24 – 2011-09-26 (×3): 5000 [IU] via ORAL
  Filled 2011-09-24 (×3): qty 5

## 2011-09-24 MED ORDER — 0.9 % SODIUM CHLORIDE (POUR BTL) OPTIME
TOPICAL | Status: DC | PRN
  Administered 2011-09-24: 1000 mL

## 2011-09-24 MED ORDER — SENNOSIDES-DOCUSATE SODIUM 8.6-50 MG PO TABS
1.0000 | ORAL_TABLET | Freq: Two times a day (BID) | ORAL | Status: DC
  Administered 2011-09-24: 1 via ORAL
  Filled 2011-09-24 (×4): qty 1

## 2011-09-24 MED ORDER — METOCLOPRAMIDE HCL 10 MG PO TABS: 5.0000 mg | ORAL_TABLET | Freq: Three times a day (TID) | ORAL | Status: DC | PRN

## 2011-09-24 MED ORDER — VITAMIN B-12 1000 MCG PO TABS
1000.0000 ug | ORAL_TABLET | Freq: Every day | ORAL | Status: DC
  Administered 2011-09-24 – 2011-09-26 (×3): 1000 ug via ORAL
  Filled 2011-09-24 (×3): qty 1

## 2011-09-24 MED ORDER — PHENYLEPHRINE HCL 10 MG/ML IJ SOLN
20.0000 mg | INTRAVENOUS | Status: DC | PRN
  Administered 2011-09-24: 10 ug/min via INTRAVENOUS

## 2011-09-24 MED ORDER — PROMETHAZINE HCL 25 MG/ML IJ SOLN: 6.2500 mg | INTRAMUSCULAR | Status: DC | PRN

## 2011-09-24 MED ORDER — SODIUM CHLORIDE 0.9 % IV SOLN: INTRAVENOUS | Status: DC

## 2011-09-24 MED ORDER — HYDROMORPHONE HCL PF 1 MG/ML IJ SOLN: 0.2500 mg | INTRAMUSCULAR | Status: DC | PRN

## 2011-09-24 MED ORDER — ACETAMINOPHEN 325 MG PO TABS: 650.0000 mg | ORAL_TABLET | Freq: Four times a day (QID) | ORAL | Status: DC | PRN

## 2011-09-24 MED ORDER — HYDROMORPHONE HCL PF 1 MG/ML IJ SOLN
0.5000 mg | INTRAMUSCULAR | Status: DC | PRN
  Administered 2011-09-24 – 2011-09-25 (×3): 0.5 mg via INTRAVENOUS
  Filled 2011-09-24 (×3): qty 1

## 2011-09-24 MED ORDER — LACTATED RINGERS IV SOLN
INTRAVENOUS | Status: DC | PRN
  Administered 2011-09-24 (×2): via INTRAVENOUS

## 2011-09-24 MED ORDER — HYDROCODONE-ACETAMINOPHEN 5-325 MG PO TABS
1.0000 | ORAL_TABLET | ORAL | Status: DC | PRN
  Administered 2011-09-24: 1 via ORAL
  Administered 2011-09-25 – 2011-09-26 (×5): 2 via ORAL
  Filled 2011-09-24 (×2): qty 1
  Filled 2011-09-24 (×2): qty 2
  Filled 2011-09-24: qty 1
  Filled 2011-09-24 (×2): qty 2

## 2011-09-24 MED ORDER — PROPOFOL 10 MG/ML IV EMUL
INTRAVENOUS | Status: DC | PRN
  Administered 2011-09-24: 30 mg via INTRAVENOUS

## 2011-09-24 MED ORDER — ONDANSETRON HCL 4 MG PO TABS: 4.0000 mg | ORAL_TABLET | Freq: Four times a day (QID) | ORAL | Status: DC | PRN

## 2011-09-24 MED ORDER — ACETAMINOPHEN 650 MG RE SUPP: 650.0000 mg | Freq: Four times a day (QID) | RECTAL | Status: DC | PRN

## 2011-09-24 MED ORDER — CHLORHEXIDINE GLUCONATE 4 % EX LIQD
60.0000 mL | Freq: Once | CUTANEOUS | Status: DC
  Filled 2011-09-24: qty 60

## 2011-09-24 MED ORDER — PROPOFOL 10 MG/ML IV EMUL
INTRAVENOUS | Status: DC | PRN
  Administered 2011-09-24: 50 ug/kg/min via INTRAVENOUS

## 2011-09-24 MED ORDER — LORAZEPAM 0.5 MG PO TABS: 0.5000 mg | ORAL_TABLET | Freq: Three times a day (TID) | ORAL | Status: DC | PRN

## 2011-09-24 MED ORDER — METOCLOPRAMIDE HCL 5 MG/ML IJ SOLN
5.0000 mg | Freq: Three times a day (TID) | INTRAMUSCULAR | Status: DC | PRN
  Filled 2011-09-24: qty 2

## 2011-09-24 MED ORDER — BUPIVACAINE HCL (PF) 0.5 % IJ SOLN
INTRAMUSCULAR | Status: AC
  Filled 2011-09-24: qty 30

## 2011-09-24 MED ORDER — CEFAZOLIN SODIUM-DEXTROSE 2-3 GM-% IV SOLR
2.0000 g | INTRAVENOUS | Status: AC
  Administered 2011-09-24: 2 g via INTRAVENOUS
  Filled 2011-09-24: qty 50

## 2011-09-24 MED ORDER — BACITRACIN ZINC 500 UNIT/GM EX OINT
TOPICAL_OINTMENT | CUTANEOUS | Status: AC
  Filled 2011-09-24: qty 15

## 2011-09-24 MED ORDER — LACTATED RINGERS IV SOLN
INTRAVENOUS | Status: DC
Start: 2011-09-24 — End: 2011-09-24

## 2011-09-24 MED ORDER — ZOLPIDEM TARTRATE 5 MG PO TABS
5.0000 mg | ORAL_TABLET | Freq: Every evening | ORAL | Status: DC | PRN
  Administered 2011-09-25 (×2): 5 mg via ORAL
  Filled 2011-09-24 (×2): qty 1

## 2011-09-24 MED ORDER — MIDAZOLAM HCL 5 MG/5ML IJ SOLN
INTRAMUSCULAR | Status: DC | PRN
  Administered 2011-09-24: 2 mg via INTRAVENOUS

## 2011-09-24 MED ORDER — FLUOXETINE HCL 20 MG PO CAPS
20.0000 mg | ORAL_CAPSULE | Freq: Every day | ORAL | Status: DC
  Administered 2011-09-24 – 2011-09-26 (×3): 20 mg via ORAL
  Filled 2011-09-24 (×3): qty 1

## 2011-09-24 SURGICAL SUPPLY — 45 items
BAG SPEC THK2 15X12 ZIP CLS (MISCELLANEOUS) ×1
BAG ZIPLOCK 12X15 (MISCELLANEOUS) ×2 IMPLANT
BANDAGE ELASTIC 6 VELCRO ST LF (GAUZE/BANDAGES/DRESSINGS) ×2 IMPLANT
BANDAGE GAUZE ELAST BULKY 4 IN (GAUZE/BANDAGES/DRESSINGS) ×2 IMPLANT
BIT DRILL 4.3MMS DISTAL GRDTED (BIT) ×1 IMPLANT
CHLORAPREP W/TINT 26ML (MISCELLANEOUS) IMPLANT
CLOTH BEACON ORANGE TIMEOUT ST (SAFETY) ×2 IMPLANT
COVER MAYO STAND STRL (DRAPES) ×2 IMPLANT
DRAPE STERI IOBAN 125X83 (DRAPES) ×2 IMPLANT
DRILL 4.3MMS DISTAL GRADUATED (BIT) ×2
DRSG EMULSION OIL 3X16 NADH (GAUZE/BANDAGES/DRESSINGS) ×2 IMPLANT
DRSG MEPILEX BORDER 4X4 (GAUZE/BANDAGES/DRESSINGS) ×2 IMPLANT
DRSG MEPILEX BORDER 4X8 (GAUZE/BANDAGES/DRESSINGS) ×2 IMPLANT
DRSG PAD ABDOMINAL 8X10 ST (GAUZE/BANDAGES/DRESSINGS) IMPLANT
ELECT REM PT RETURN 9FT ADLT (ELECTROSURGICAL) ×2
ELECTRODE REM PT RTRN 9FT ADLT (ELECTROSURGICAL) ×1 IMPLANT
GLOVE BIO SURGEON STRL SZ7.5 (GLOVE) IMPLANT
GLOVE BIOGEL PI IND STRL 8.5 (GLOVE) ×1 IMPLANT
GLOVE BIOGEL PI INDICATOR 8.5 (GLOVE) ×1
GLOVE SURG ORTHO 7.0 STRL STRW (GLOVE) IMPLANT
GLOVE SURG ORTHO 9.0 STRL STRW (GLOVE) ×2 IMPLANT
GOWN PREVENTION PLUS LG XLONG (DISPOSABLE) ×2 IMPLANT
GUIDEPIN 3.2X17.5 THRD DISP (PIN) ×2 IMPLANT
GUIDEWIRE BALL NOSE 100CM (WIRE) ×2 IMPLANT
HIP FRA NAIL LAG SCREW 10.5X90 (Orthopedic Implant) ×2 IMPLANT
HIP FRAC NAIL LEFT 11X360MM (Orthopedic Implant) ×2 IMPLANT
KIT BASIN OR (CUSTOM PROCEDURE TRAY) ×2 IMPLANT
MANIFOLD NEPTUNE II (INSTRUMENTS) IMPLANT
NAIL HIP FRAC LEFT 11X360MM (Orthopedic Implant) ×1 IMPLANT
NS IRRIG 1000ML POUR BTL (IV SOLUTION) ×2 IMPLANT
PACK GENERAL/GYN (CUSTOM PROCEDURE TRAY) ×2 IMPLANT
PAD CAST 4YDX4 CTTN HI CHSV (CAST SUPPLIES) ×1 IMPLANT
PADDING CAST COTTON 4X4 STRL (CAST SUPPLIES) ×2
POSITIONER SURGICAL ARM (MISCELLANEOUS) ×2 IMPLANT
SCREW BONE CORTICAL 5.0X38 (Screw) ×2 IMPLANT
SCREW BONE CORTICAL 5.0X40 (Screw) ×2 IMPLANT
SCREW LAG HIP FRA NAIL 10.5X90 (Orthopedic Implant) ×1 IMPLANT
SCREWDRIVER HEX TIP 3.5MM (MISCELLANEOUS) ×2 IMPLANT
STAPLER VISISTAT 35W (STAPLE) ×2 IMPLANT
SUT VIC AB 0 CT1 27 (SUTURE) ×6
SUT VIC AB 0 CT1 27XBRD ANTBC (SUTURE) ×3 IMPLANT
SUT VIC AB 2-0 CT1 27 (SUTURE) ×4
SUT VIC AB 2-0 CT1 27XBRD (SUTURE) ×2 IMPLANT
TOWEL OR 17X26 10 PK STRL BLUE (TOWEL DISPOSABLE) ×4 IMPLANT
WATER STERILE IRR 1500ML POUR (IV SOLUTION) IMPLANT

## 2011-09-24 NOTE — Anesthesia Preprocedure Evaluation (Addendum)
Anesthesia Evaluation  Patient identified by MRN, date of birth, ID band Patient awake    Reviewed: Allergy & Precautions, H&P , NPO status , Patient's Chart, lab work & pertinent test results  Airway Mallampati: II TM Distance: >3 FB Neck ROM: Full    Dental No notable dental hx.    Pulmonary neg pulmonary ROS,  breath sounds clear to auscultation  Pulmonary exam normal       Cardiovascular negative cardio ROS  Rhythm:Regular Rate:Normal     Neuro/Psych negative neurological ROS  negative psych ROS   GI/Hepatic negative GI ROS, Neg liver ROS,   Endo/Other  negative endocrine ROSHypothyroidism   Renal/GU negative Renal ROS  negative genitourinary   Musculoskeletal negative musculoskeletal ROS (+)   Abdominal   Peds negative pediatric ROS (+)  Hematology negative hematology ROS (+)   Anesthesia Other Findings Lower dental implants  Reproductive/Obstetrics negative OB ROS                           Anesthesia Physical Anesthesia Plan  ASA: III  Anesthesia Plan: Spinal   Post-op Pain Management:    Induction:   Airway Management Planned: Simple Face Mask  Additional Equipment:   Intra-op Plan:   Post-operative Plan: Extubation in OR  Informed Consent: I have reviewed the patients History and Physical, chart, labs and discussed the procedure including the risks, benefits and alternatives for the proposed anesthesia with the patient or authorized representative who has indicated his/her understanding and acceptance.   Dental advisory given  Plan Discussed with: CRNA  Anesthesia Plan Comments:         Anesthesia Quick Evaluation

## 2011-09-24 NOTE — Transfer of Care (Signed)
Immediate Anesthesia Transfer of Care Note  Patient: Kelli [name]  Procedure(s) Performed: Procedure(s) (LRB): INTRAMEDULLARY (IM) NAIL FEMORAL (Left)  Patient Location: PACU  Anesthesia Type: MAC and Spinal  Level of Consciousness: awake, alert  and oriented  Airway & Oxygen Therapy: Patient Spontanous Breathing and Patient connected to face mask oxygen  Post-op Assessment: Report given to PACU RN and Post -op Vital signs reviewed and stable  Post vital signs: Reviewed and stable  Complications: No apparent anesthesia complications

## 2011-09-24 NOTE — Anesthesia Procedure Notes (Signed)
Spinal Patient location during procedure: OR Staffing Anesthesiologist: Nicholi Ghuman Performed by: anesthesiologist  Preanesthetic Checklist Completed: patient identified, site marked, surgical consent, pre-op evaluation, timeout performed, IV checked, risks and benefits discussed and monitors and equipment checked Spinal Block Patient position: sitting Prep: Betadine Patient monitoring: heart rate, continuous pulse ox and blood pressure Approach: left paramedian Location: L2-3 Injection technique: single-shot Needle Needle type: Spinocan  Needle gauge: 22 G Needle length: 9 cm Additional Notes Expiration date of kit checked and confirmed. Patient tolerated procedure well, without complications.     

## 2011-09-24 NOTE — Consult Note (Signed)
Reason for Consult:  Left hip pain Referring Physician:   Dr. Enos Fling Kelli is an 71 y.o. female.  HPI:  72 y/o female with PMH of breast cancer c/o left hip pain for several weeks.  She has been bearing weight as tolerated but says it hurts deep in her groin with moderate pain.  She denies any injury.  She has not fallen.  Pain is relived by lying down with her hip flexed slightly.  Dr. Dierdre Forth ordered an MRI of her L hip which revealed metastatic disease of the pelvis, lumbar spine and proximal femurs bilat.  There also was noted a pathologic fracture of the left femoral neck.  She was admitted by her oncologist, Dr. Darrold Span, and I'm consulted for management of her pathologic fracture.  She denies any h/o smoking or heart disease.  No recent f/c/n/v/wt loss.  She takes no blood thinners and last ate this morning.  Past Medical History  Diagnosis Date  . Breast cancer JUNE OF 2002    LEFT BREAST  . Thyroid disease IN 1982    HX IODINE-131 ABLATION FOR HYPERTHYROIDISM  . Osteoporosis   . Vitamin D insufficiency   . Chronic fatigue   . Depression   . Anxiety   . Insomnia   . Stress     Past Surgical History  Procedure Date  . Cholecystectomy 1970  . Mastectomy partial / lumpectomy w/ axillary lymphadenectomy JUNE 2002    History reviewed. No pertinent family history.  Social History:  reports that she has never smoked. She has never used smokeless tobacco. She reports that she does not drink alcohol or use illicit drugs.  Allergies:  Allergies  Allergen Reactions  . Other     "MYCIN" DRUGS  . Sulfa Antibiotics     Medications: I have reviewed the patient's current medications.  Results for orders placed during the hospital encounter of 09/24/11 (from the past 48 hour(s))  COMPREHENSIVE METABOLIC PANEL     Status: Abnormal   Collection Time   09/24/11  2:38 PM      Component Value Range Comment   Sodium 139  135 - 145 mEq/L    Potassium 3.6  3.5 - 5.1 mEq/L      Chloride 100  96 - 112 mEq/L    CO2 26  19 - 32 mEq/L    Glucose, Bld 94  70 - 99 mg/dL    BUN 13  6 - 23 mg/dL    Creatinine, Ser 9.56  0.50 - 1.10 mg/dL    Calcium 21.3 (*) 8.4 - 10.5 mg/dL    Total Protein 6.8  6.0 - 8.3 g/dL    Albumin 3.5  3.5 - 5.2 g/dL    AST 46 (*) 0 - 37 U/L    ALT 22  0 - 35 U/L    Alkaline Phosphatase 218 (*) 39 - 117 U/L    Total Bilirubin 0.4  0.3 - 1.2 mg/dL    GFR calc non Af Amer 83 (*) >90 mL/min    GFR calc Af Amer >90  >90 mL/min   CBC     Status: Abnormal   Collection Time   09/24/11  2:38 PM      Component Value Range Comment   WBC 12.6 (*) 4.0 - 10.5 K/uL    RBC 3.57 (*) 3.87 - 5.11 MIL/uL    Hemoglobin 10.8 (*) 12.0 - 15.0 g/dL    HCT 08.6 (*) 57.8 - 46.0 %  MCV 91.9  78.0 - 100.0 fL    MCH 30.3  26.0 - 34.0 pg    MCHC 32.9  30.0 - 36.0 g/dL    RDW 16.1  09.6 - 04.5 %    Platelets 206  150 - 400 K/uL   DIFFERENTIAL     Status: Normal   Collection Time   09/24/11  2:38 PM      Component Value Range Comment   Neutrophils Relative 61  43 - 77 %    Neutro Abs 7.7  1.7 - 7.7 K/uL    Lymphocytes Relative 31  12 - 46 %    Lymphs Abs 3.9  0.7 - 4.0 K/uL    Monocytes Relative 8  3 - 12 %    Monocytes Absolute 1.0  0.1 - 1.0 K/uL    Eosinophils Relative 1  0 - 5 %    Eosinophils Absolute 0.1  0.0 - 0.7 K/uL    Basophils Relative 0  0 - 1 %    Basophils Absolute 0.0  0.0 - 0.1 K/uL   PROTIME-INR     Status: Abnormal   Collection Time   09/24/11  2:38 PM      Component Value Range Comment   Prothrombin Time 15.5 (*) 11.6 - 15.2 seconds    INR 1.20  0.00 - 1.49   APTT     Status: Normal   Collection Time   09/24/11  2:38 PM      Component Value Range Comment   aPTT 35  24 - 37 seconds   RETICULOCYTES     Status: Abnormal   Collection Time   09/24/11  2:38 PM      Component Value Range Comment   Retic Ct Pct 1.6  0.4 - 3.1 %    RBC. 3.57 (*) 3.87 - 5.11 MIL/uL    Retic Count, Manual 57.1  19.0 - 186.0 K/uL   URINALYSIS, ROUTINE W REFLEX  MICROSCOPIC     Status: Normal   Collection Time   09/24/11  4:00 PM      Component Value Range Comment   Color, Urine YELLOW  YELLOW    APPearance CLEAR  CLEAR    Specific Gravity, Urine 1.012  1.005 - 1.030    pH 7.0  5.0 - 8.0    Glucose, UA NEGATIVE  NEGATIVE mg/dL    Hgb urine dipstick NEGATIVE  NEGATIVE    Bilirubin Urine NEGATIVE  NEGATIVE    Ketones, ur NEGATIVE  NEGATIVE mg/dL    Protein, ur NEGATIVE  NEGATIVE mg/dL    Urobilinogen, UA 1.0  0.0 - 1.0 mg/dL    Nitrite NEGATIVE  NEGATIVE    Leukocytes, UA NEGATIVE  NEGATIVE MICROSCOPIC NOT DONE ON URINES WITH NEGATIVE PROTEIN, BLOOD, LEUKOCYTES, NITRITE, OR GLUCOSE <1000 mg/dL.    Dg Femur Right  09/24/2011  *RADIOLOGY REPORT*  Clinical Data: Pain on standing  RIGHT FEMUR - 2 VIEW  Comparison: None.  Findings: The right femur is intact.  There is mild degenerative joint disease of the right hip.  No acute abnormality is seen.  IMPRESSION: Negative right femur other than degenerative joint disease of the right hip.  Original Report Authenticated By: Juline Patch, M.D.   ROS:  As above and o/w neg PE: Blood pressure 146/76, pulse 86, temperature 98.6 F (37 C), temperature source Oral, resp. rate 18, height 5\' 4"  (1.626 m), weight 63.6 kg (140 lb 3.4 oz), SpO2 97.00%. WN WD female in nad.  A and O x 4.  Mood and affect normal.  EOMI.  Respirations unlabored.  L hip with full ROM.  No gross deformity.  5/5 strength at IP, quad, ADF, APF and EHL bilat.  Pain at groin with leg lift.  Skin healthy and intact.  2+ dp and pt pulses.  Assessment/Plan: Left femoral neck pathologic fracture - I explained the nature of the condition and the risk of fracture to the patient and her husband in detail.  I believe operative treatment is medically necessary to treat this condition to prevent complete fracture of the femoral neck.  I've discussed the treatment plan with Dr. Darrold Span who feels the pt is up to the demands of surgical treatment from the  medical standpoint.  The risks and benefits of the alternative treatment options have been discussed in detail.  The patient wishes to proceed with surgery and specifically understands risks of bleeding, infection, nerve damage, blood clots, need for additional surgery, amputation and death.   Toni Arthurs Oct 12, 2011, 6:14 PM

## 2011-09-24 NOTE — H&P (Signed)
Date of Admission 09-24-11  Physicians: J.Beekman, W.Elkins, M. Altheimer, (previously seen by Dr Erasmo Leventhal, who patient understands is retired and she has not established with another orthopedist).  HPI: patient is a 71 yo WF admitted with apparent metastatic disease to bone and pathologic left femoral neck fracture by MRI left hip done in [place] system on 09-20-11. 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  Patient had acute left hip pain ~ 09-09-11, then was seen at Dr Shawnee Knapp office (where she is followed for osteoarthritis), started on pain medication and anti-inflammatories and MRI as above obtained. I was contacted by Dr Shawnee Knapp office with the MRI information on 09-23-11, with recommendation then that patient be seen in ED or directly admitted for orthopedics consult and further evaluation and pain control. Patient preferred to come to hospital today and is admitted now to my service (as no other arrangements in place for today). She has been on yearly follow up at my office, last seen 04-22-11 at which time exam and labs unremarkable other than Hgb 11.8, previously ~ 13.  Patient has had some long-standing left knee discomfort but was not aware of other clearly different pain in LLE until 09-09-11 when she developed increased pain in left hip and thigh while shopping, without known trauma. She did not improve with tramadol/ anti-inflammatory such that MRI was obtained as above. She describes pain low back, left hip and inguinal area, all of LLE and worse in left knee since she twisted knee getting off of MRI table. She has been ambulating with crutches at home. She has used 1/2 of 5/500 vicodin with more improvement than tramadol. She had some constipation when using more of the tramadol. She has had some swelling at left knee. She has also had some sternal discomfort since ~ April, seen in ED then for  shortness of breath after working in yard, EKG unremarkable and symptoms improved with IVF then. Hgb was 10.8 on those labs and no Xray done.  On Review of Systems otherwise: no HA or neurologic symptoms. No noted bleeding. Some shortness of breath climbing stairs, none at rest. No cough. No UE pain. No noted changes in breasts. Appetite good. Bowels moving now. No bladder symptoms. No recent infectious illness. Remainder of ROS unchanged.  She last ate toast this am and has not taken pain medication today. She walked to car with crutches this am.  Allergies:  Allergies  Allergen Reactions  . Other     "MYCIN" DRUGS  . Sulfa Antibiotics    Past Medical History  Diagnosis Date  . Breast cancer JUNE OF 2002    LEFT BREAST  . Thyroid disease IN 1982    HX IODINE-131 ABLATION FOR HYPERTHYROIDISM  . Osteoporosis   . Vitamin D insufficiency   . Chronic fatigue   . Depression   . Anxiety   . Insomnia   . Stress   Left breast cancer was treated with lumpectomy and axillary node evaluation in June 2002, 13 nodes involved, ER/PR positive and HER 2 negative.She had adriamycin/ cytoxan followed by [facility] chemotherapy, local radiation, tamoxifen from April 2003 thru Dec 2004 then aromatase inhibitor (arimidex) from Dec 2004 thru June 2011 when this was [facility] due to duration of treatment and known osteoporosis.  Past Surgical History  Procedure Date  . Cholecystectomy 1970  . Mastectomy partial / lumpectomy w/ axillary lymphadenectomy JUNE 2002   Family  history: mother living at 52, cardiac disease. Social History:  reports that she has never smoked. She does not have any smokeless tobacco history on file. She reports that she does not drink alcohol or use illicit drugs. She lives with husband in [place].  Physical Exam Awake and alert, not in acute distress supine in bed on RA .Husband at bedside. HEENT: normal hair pattern. PERRL, not icteric. Oral mucosa moist and clear. Neck supple without  JVD. No cervical or supraclavicular adenopathy.  Lungs clear to auscultation Heart RRR, no gallop, clear heart sounds Breasts: right without dominant mass, skin or nipple findings. Left with scarring at lumpectomy incision upper outer breast, no dominant mass or skin/ nipple findings. Nothing in axillae, no swelling in either upper extremity. Abdomen soft and nontender, some normal bowel sounds, no HSM. LE without pitting edema or cords. Slight swelling at left knee, no heat or erythema.  Neuro: CN, sensory intact. Moves all extremities in bed, motor strength not tested otherwise.  Skin: resolving ecchymosis right forearm from MRI. No rash.  Medications Prior to Admission  Medication Sig Dispense Refill  . Calcium Carbonate-Vitamin D (CALTRATE 600+D) 600-400 MG-UNIT per tablet Take 1 tablet by mouth 2 (two) times daily.      . Cholecalciferol 5000 UNITS TABS Take 1 tablet by mouth daily.      Marland Kitchen FLUoxetine (PROZAC) 20 MG capsule Take 1 capsule (20 mg total) by mouth daily.  90 capsule  PRN  . Glucosamine-Chondroitin (COSAMIN DS PO) Take 1 tablet by mouth daily.       Marland Kitchen ibuprofen (ADVIL,MOTRIN) 200 MG tablet Take 400 mg by mouth every 8 (eight) hours as needed. For pain.      Marland Kitchen levothyroxine (SYNTHROID, LEVOTHROID) 75 MCG tablet Take 75 mcg by mouth daily.      Marland Kitchen LORazepam (ATIVAN) 1 MG tablet Take 0.5-1 mg by mouth every 8 (eight) hours as needed. For anxiety.      . vitamin B-12 (CYANOCOBALAMIN) 1000 MCG tablet Take 1,000 mcg by mouth daily.      Marland Kitchen zolpidem (AMBIEN) 5 MG tablet Take 1 tablet (5 mg total) by mouth at bedtime as needed.  30 tablet  5   Comparison: 08/29/2004   MRI OF THE LEFT HIP WITHOUT AND WITH CONTRAST  Findings: Innumerable enhancing lesions of varying sizes are noted  throughout the visualized bony pelvis, hips, and lumbar spine. The  lumbar spine lesions are new compared the prior MRI from  03/20/2010. The appearance reflects diffuse osseous metastatic  disease.    There is a diffuse infiltrative marrow lesion filling the entire  medullary space of the left femoral neck and extending down  approximately 14 cm in the left proximal femur, with an associated  pathologic fracture of the left femoral neck. Surrounding marrow  edema and adjacent soft tissue edema noted. The fracture is  primarily basicervical although there may be some extension to the  greater trochanter. Some of the metastatic lesions to the bony  pelvis are moderately expansile, for example in the left inferior  pubic ramus on image 70 of series 3.  A 1.3 cm fibroid is present in the uterus. Trace free pelvic fluid  is present.  IMPRESSION:  1. Diffuse osseous metastatic disease, with innumerable lesions in  the pelvis, hips, and lower lumbar spine. A large lesion  infiltrating the left proximal femur is associated with a  pathologic fracture of the left femoral neck which may have mild  involvement of the greater trochanter.  2. Trace free pelvic fluid, etiology uncertain.   All labs pending   Patient and husband were informed of the MRI by Dr Shawnee Knapp office yesterday and we have discussed again now. I have called orthopedic consult to [facility], as that group is on call for [facility] today and as patient does not have an established orthopedic surgeon now. She and husband understand that surgery to fix the fracture may not be possible if involvement of surrounding bones cannot allow orthopedic hardware. Will keep nonweight bearing for now. Will get plain flims of other long bones and sternum, CT CAP for staging.She will need some biopsy for path confirmation, especially out 11 years from (high risk) breast cancer. Will check CBC, chemistries, breast markers. Will hold prophylactic lovenox until orthopedics gives recommendations for surgery. Will use oral pain meds if sufficient. Will also keep NPO until we hear from orthopedics.  Assessment/Plan 1. Apparent pathologic fracture  of left femoral neck, with multiple bone lesions likely metastatic.  Additional staging and confirmation of diagnosis,  and orthopedic consultation now.  2.history of mutliple node postitve left breast cancer 2002: history as above. This is most likely primary, but needs to be confirmed. 3. Progressive mild anemia based on CBC Jan 2013 and April 2013. Labs pending. 4. Anxiety 5.osteoarthritis 6.osteoporosis  Sotero Brinkmeyer P 09/24/2011, 10:34 AM

## 2011-09-24 NOTE — Anesthesia Postprocedure Evaluation (Signed)
  Anesthesia Post-op Note  Patient: Kelli [name]  Procedure(s) Performed: Procedure(s) (LRB): INTRAMEDULLARY (IM) NAIL FEMORAL (Left)  Patient Location: PACU  Anesthesia Type: Spinal  Level of Consciousness: awake and alert   Airway and Oxygen Therapy: Patient Spontanous Breathing  Post-op Pain: mild  Post-op Assessment: Post-op Vital signs reviewed, Patient's Cardiovascular Status Stable, Respiratory Function Stable, Patent Airway and No signs of Nausea or vomiting  Post-op Vital Signs: stable  Complications: No apparent anesthesia complications

## 2011-09-24 NOTE — Telephone Encounter (Signed)
Med Onc  Called patient now, as I had not gotten answer last pm and cannot tell that she has yet been evaluated in hospital. Spoke with husband, then patient; they are just leaving for hospital now, possibly for direct admission if set up by Dr Dierdre Forth, otherwise to ED to expedite orthopedic surgery evaluation/ possible intervention.  Kelli Mcgill, MD

## 2011-09-24 NOTE — Progress Notes (Signed)
Spoke with orthopedic surgeon on call, who has reviewed MRI and feels IM nail appropriate. Surgery likely will be this evening. Spoke with RN now and ordered post CXR and EKG as preop. Ila Mcgill, MD

## 2011-09-24 NOTE — Preoperative (Signed)
Beta Blockers   Reason not to administer Beta Blockers:Not Applicable 

## 2011-09-24 NOTE — Progress Notes (Signed)
Clinical Social Work referred for advance directives. Patient not ready to complete at this time. CSW will follow up with patient and spouse.  Kathrin Penner, MSW, [facility] Clinical Social Worker [facility] [phone]

## 2011-09-24 NOTE — Brief Op Note (Addendum)
09/24/2011  9:29 PM  PATIENT:  Kelli [name]  71 y.o. female  PRE-OPERATIVE DIAGNOSIS:  left femoral neck pathologic fracture  POST-OPERATIVE DIAGNOSIS:  same  Procedure(s): 1.  Intramedullary nailing of left femoral neck pathologic fracture 2.  fluoro  SURGEON:  Toni Arthurs, MD  ASSISTANT: n/a  ANESTHESIA:   spinal  EBL:  100 cc  Specimens:  Left proximal femur intramedullary reamings to pathology  COMPLICATIONS:  None apparent  DISPOSITION:  Extubated, awake and stable to recovery.  DICTATION ID:  [id number]

## 2011-09-25 DIAGNOSIS — N1330 Unspecified hydronephrosis: Secondary | ICD-10-CM

## 2011-09-25 DIAGNOSIS — C7952 Secondary malignant neoplasm of bone marrow: Secondary | ICD-10-CM

## 2011-09-25 HISTORY — PX: BONE BIOPSY: SHX375

## 2011-09-25 LAB — IRON AND TIBC
Iron: 42 ug/dL (ref 42–135)
Saturation Ratios: 11 % — ABNORMAL LOW (ref 20–55)
TIBC: 374 ug/dL (ref 250–470)
UIBC: 332 ug/dL (ref 125–400)

## 2011-09-25 LAB — FERRITIN: Ferritin: 817 ng/mL — ABNORMAL HIGH (ref 10–291)

## 2011-09-25 MED ORDER — DEXTROSE-NACL 5-0.9 % IV SOLN
INTRAVENOUS | Status: DC
  Administered 2011-09-25: 10:00:00 via INTRAVENOUS

## 2011-09-25 MED ORDER — SENNOSIDES-DOCUSATE SODIUM 8.6-50 MG PO TABS
2.0000 | ORAL_TABLET | Freq: Two times a day (BID) | ORAL | Status: DC
  Administered 2011-09-25 – 2011-09-26 (×3): 2 via ORAL
  Filled 2011-09-25 (×4): qty 2

## 2011-09-25 MED ORDER — HYDROMORPHONE HCL PF 1 MG/ML IJ SOLN
0.5000 mg | INTRAMUSCULAR | Status: DC | PRN
  Administered 2011-09-25 – 2011-09-26 (×3): 1 mg via INTRAVENOUS
  Filled 2011-09-25 (×3): qty 1

## 2011-09-25 MED ORDER — HYDROMORPHONE HCL 2 MG PO TABS: 2.0000 mg | ORAL_TABLET | ORAL | Status: DC | PRN

## 2011-09-25 MED ORDER — SODIUM CHLORIDE 0.9 % IV SOLN
90.0000 mg | Freq: Once | INTRAVENOUS | Status: AC
  Administered 2011-09-25: 90 mg via INTRAVENOUS
  Filled 2011-09-25: qty 500

## 2011-09-25 NOTE — Progress Notes (Signed)
Spoke with pt concerning discharge needs and Home Health. Pt wants to wait to see if she needs Home Health. Will need PT eval.  Will continue to follow for discharge needs. mp

## 2011-09-25 NOTE — Evaluation (Signed)
Occupational Therapy Evaluation Patient Details Name: Kelli [name] MRN: [id number] DOB: 1940-08-24 Today's Date: 09/25/2011 Time: 7829-5621 OT Time Calculation (min): 20 min  OT Assessment / Plan / Recommendation Clinical Impression  Pt doing well POD 1 L hip fx with IM nail. Skilled OT indicated to maximize I w/BADLs to supervision level in prep for d/c home with husband.    OT Assessment  Patient needs continued OT Services    Follow Up Recommendations  No OT follow up    Barriers to Discharge      Equipment Recommendations  Rolling walker with 5" wheels    Recommendations for Other Services    Frequency  Min 2X/week    Precautions / Restrictions Precautions Precautions: Fall Restrictions Weight Bearing Restrictions: No LLE Weight Bearing: Weight bearing as tolerated   Pertinent Vitals/Pain BP 106/60 following ambulation in hallway.    ADL  Grooming: Simulated;Set up Where Assessed - Grooming: Unsupported sitting Upper Body Bathing: Simulated;Set up Where Assessed - Upper Body Bathing: Unsupported sitting Lower Body Bathing: Simulated;Minimal assistance Where Assessed - Lower Body Bathing: Supported sit to stand Upper Body Dressing: Simulated;Set up Where Assessed - Upper Body Dressing: Unsupported sitting Lower Body Dressing: Simulated;Minimal assistance Where Assessed - Lower Body Dressing: Supported sit to stand Toilet Transfer: Simulated;Minimal assistance Toilet Transfer Method: Sit to Barista: Other (comment) Nurse, children's) Toileting - Clothing Manipulation and Hygiene: Simulated;Minimal assistance Where Assessed - Toileting Clothing Manipulation and Hygiene: Standing ADL Comments: Eval limited 2* pt feeling light- headed. BP 106/60.    OT Diagnosis: Generalized weakness  OT Problem List: Decreased activity tolerance;Decreased knowledge of use of DME or AE OT Treatment Interventions: Self-care/ADL training;Therapeutic activities;DME  and/or AE instruction;Patient/family education   OT Goals Acute Rehab OT Goals OT Goal Formulation: With patient/family Time For Goal Achievement: 10/02/11 Potential to Achieve Goals: Good ADL Goals Pt Will Perform Grooming: with supervision;Standing at sink ADL Goal: Grooming - Progress: Goal set today Pt Will Perform Lower Body Bathing: with supervision;Sit to stand from chair;Sit to stand from bed ADL Goal: Lower Body Bathing - Progress: Goal set today Pt Will Perform Lower Body Dressing: with supervision;Sit to stand from bed;Sit to stand from chair ADL Goal: Lower Body Dressing - Progress: Goal set today Pt Will Transfer to Toilet: with supervision;Ambulation;Regular height toilet;3-in-1 ADL Goal: Toilet Transfer - Progress: Goal set today Pt Will Perform Toileting - Clothing Manipulation: with supervision;Standing ADL Goal: Toileting - Clothing Manipulation - Progress: Goal set today Pt Will Perform Toileting - Hygiene: with supervision;Sit to stand from 3-in-1/toilet ADL Goal: Toileting - Hygiene - Progress: Goal set today  Visit Information  Last OT Received On: 09/25/11 Assistance Needed: +1 PT/OT Co-Evaluation/Treatment: Yes    Subjective Data  Subjective: I feel a little woozy. Patient Stated Goal: Return home with husband.   Prior Functioning  Home Living Lives With: Spouse Available Help at Discharge: Family;Available 24 hours/day Type of Home: House Home Access: Stairs to enter [employer] of Steps: 2 Entrance Stairs-Rails: None Home Layout: Two level;Able to live on main level with bedroom/bathroom Bathroom Shower/Tub: Tub/shower unit;Walk-in shower Bathroom Toilet: Standard Home Adaptive Equipment: Crutches Prior Function Level of Independence: Independent Able to Take Stairs?: Yes Driving: Yes Vocation: Retired Musician: No difficulties    Cognition  Overall Cognitive Status: Appears within functional limits for tasks  assessed/performed Arousal/Alertness: Awake/alert Orientation Level: Appears intact for tasks assessed Behavior During Session: [facility] for tasks performed    Extremity/Trunk Assessment Right Upper Extremity Assessment RUE ROM/Strength/Tone:  WFL for tasks assessed Left Upper Extremity Assessment LUE ROM/Strength/Tone: WFL for tasks assessed   Mobility Bed Mobility Bed Mobility: Supine to Sit Supine to Sit: 4: Min assist;With rails;HOB elevated Details for Bed Mobility Assistance: min VCs for sequencing and hand placement. Transfers Transfers: Sit to Stand;Stand to Sit Sit to Stand: 4: Min assist;With upper extremity assist;From bed Stand to Sit: 4: Min assist;With upper extremity assist;With armrests;To chair/3-in-1 Details for Transfer Assistance: min VCs for hand placement and LE management.   Exercise    Balance    End of Session OT - End of Session Activity Tolerance: Patient tolerated treatment well Patient left: in chair;with call bell/phone within reach;with family/visitor present Nurse Communication: Mobility status  GO     Merrilee Ancona A OTR/L [phone] 09/25/2011, 2:19 PM

## 2011-09-25 NOTE — Op Note (Signed)
NAMEMarland [name]  Kelli, [name] NO.:  [id number]  MEDICAL RECORD NO.:  [id number]  LOCATION:  1342                         FACILITY:  [facility]  PHYSICIAN:  Toni Arthurs, MD        DATE OF BIRTH:  07-Apr-1940  DATE OF PROCEDURE:  09/24/2011 DATE OF DISCHARGE:                              OPERATIVE REPORT   PREOPERATIVE DIAGNOSIS:  Left femoral neck and intertrochanteric pathologic fracture.  POSTOPERATIVE DIAGNOSIS:  Left femoral neck and intertrochanteric pathologic fracture.  PROCEDURE: 1. Intramedullary nailing of left proximal femur pathologic fracture. 2. Intraoperative interpretation of fluoroscopic imaging.  SURGEON:  Toni Arthurs, MD  ANESTHESIA:  Spinal.  ESTIMATED BLOOD LOSS:  100 mL.  COMPLICATIONS:  None apparent.  DISPOSITION:  Extubated, awake, and stable to recovery.  INDICATIONS FOR PROCEDURE:  The patient is a 71 year old female with past medical history significant for breast cancer.  She presented with a 3-week history of left groin pain and difficulty walking.  MRI reveals a metastatic lesion throughout the proximal femur including the femoral neck and intertrochanteric area.  She also has a pathologic fracture of the inferior aspect of the left femoral neck.  She presents now for operative treatment of this femoral neck pathologic fracture with involvement of the intertrochanteric area.  She understands the risks, benefits, and the alternative treatment options and elects surgical treatment.  She specifically understands risks of bleeding, infection, nerve damage, blood clots, need for additional surgery, amputation, and death.  PROCEDURE IN DETAIL:  After preoperative consent was obtained, the correct operative site was identified.  The patient was brought to the operating room and placed supine on the operating table.  General anesthesia was induced.  Preoperative antibiotics were administered. Surgical time-out was taken.  Left lower  extremity was prepped and draped in a standard sterile fashion.  A longitudinal incision was made 3 fingerbreadths proximal from the tip of the greater trochanter.  Sharp dissection was carried down through the skin and subcutaneous tissue. The IT band was incised.  The muscles of the gluteus medius were split bluntly.  The tip of the greater trochanter was palpated.  An awl was placed over the tip of the trochanter and a guidewire was inserted into the tip of the greater trochanter.  AP and lateral views showed appropriate position of the guide pin.  It was advanced into the femoral canal.  An opening reamer was used to open the femoral canal over the guide pin.  The pin was removed.  A ball-tip guidewire was then inserted down the femoral canal to the level of superior pole of the patella.  AP and lateral views confirmed appropriate position of the guide pin.  The guide pin was measured and a 11 mm x 360 mm femoral nail was selected. The femoral canal was reamed to a diameter of 13 mm reaming from the entry reamer as well as from the femoral canal.  Reamers were sent as a specimen to Pathology.  The nail was then inserted over the guidepin to the level where the interlocking screw lined up with the femoral head centrally.  The targeting guide was then used to center the targeting jig on the femoral head in  the AP and lateral planes.  Guide pin was inserted through a stab incision in the lateral thigh and inserted through the lateral cortex of the femur up the femoral neck and into the femoral head.  Appropriate positioning was confirmed on the AP and lateral views.  The pin was measured and a 90-mm screw was selected. The guide pin was overreamed to a depth of 95 mm.  The screw was inserted adjacent to the subchondral bone of the femoral head.  AP and lateral views confirmed appropriate position of the screw.  The proximal end of the nail was then tightened to lock the screw in place.   The final AP and lateral views showed appropriate position of the nail and interlocking screw.  Attention was then turned to the distal aspect of the screw.  The perfect circle technique was used to insert 2 interlocking screws percutaneously from lateral to medial.  Both were noted on AP and lateral views to be appropriately positioned and at the appropriate length.  All 4 wounds were irrigated copiously.  Inverted simple sutures of 2-0 Vicryl were used to close subcutaneous tissue.  Staples were used to close the skin incisions.  Sterile dressings were applied.  The patient was awakened from anesthesia and transported to the recovery room in stable condition.  FOLLOWUP PLAN:  The patient will be weightbearing as tolerated on the left lower extremity.  She will have physical therapy and occupational therapy consults.  She will start on Lovenox tomorrow for DVT prophylaxis.     Toni Arthurs, MD     JH/MEDQ  D:  09/24/2011  T:  09/25/2011  Job:  [id number]

## 2011-09-25 NOTE — Progress Notes (Signed)
Upon shift change, patient had already left for surgery.  Patient arrived back from surgery of left hip around 2315pm.  Patient was alert and oriented; reporting pain 7/10.  Patient's vitals for first 4 hours were WNL.  Patient was given dilaudid several times and Norco for pain control.  Patient has voided 400cc's since surgery and has finally been able to sleep.  Will continue to monitor.Kenton Kingfisher [place]

## 2011-09-25 NOTE — Progress Notes (Signed)
09/25/2011, 8:33 AM  Hospital day: 2 Antibiotics: cefazolin given pre-op x1 Chemotherapy: none.  IV bisphosphonate to be given today. Post op day 1   IM nail left femur  Subjective: Surgery completed without complications by Dr Victorino Dike. IV dilaudid + hydrocodone used for pain thru night. She has been up to [employer] [employer] and wants to eat. Most pain now is left knee and left lower leg. No nausea, no SOB. To begin lovenox and can be weightbearing as tolerated now. Will order PT/OT, to begin at least tomorrow. Husband not here yet this am.  Plain Xrays of other long bones do not show other impending fractures. CT CAP show mixed lytic/sclerotic lesions T 5-8 including slight compression superior aspect T8,  nothing obvious in sternum. Involvement of several ribs. Central apparent liver met 4.4cm with at least 3 other lesions, 1.5 cm hypodense lesion in spleen, left hydronephrosis proximal with decompressed ureter, involvement LS especially L1, some small adenopathy, uterus and adnexa ok, right kidney ok.  Objective: Vital signs in last 24 hours: Blood pressure 100/62, pulse 89, temperature 98.1 F (36.7 C), temperature source Oral, resp. rate 16, height 5\' 4"  (1.626 m), weight 140 lb 3.4 oz (63.6 kg), SpO2 95.00%. Awake, alert, does not appear uncomfortable just having had hydrocodone po. Respirations not labored RA. PERRL, oral mucosa moist. Lungs clear. Peripheral IV right forearm, minimally puffy above dressing, not tender and no erythema. Heart RRR. Abdomen soft, not distended, some BS. Two small dressings left upper leg both dry, no significant bruising around these. No edema lower legs or feet, feet warm. Moves all extremities in bed.  Intake/Output from previous day: 07/03 0701 - 07/04 0700 In: 2017 [I.V.:2017] Out: 1425 [Urine:1350; Blood:75] Intake/Output this shift:   IV D5NS has been at 100/hr and now changed to [facility].   Lab Results:  Basename 09/24/11 1438  WBC 12.6*  HGB 10.8*  HCT 32.8*   PLT 206  ANC 7.7  INR 1.2 and PTT 35 pre op Iron 42/ % sat 11 retics 1.6% (57) BMET  Basename 09/24/11 1438  NA 139  K 3.6  CL 100  CO2 26  GLUCOSE 94  BUN 13  CREATININE 0.77  CALCIUM 10.7*  alb 3.5, AST 46, ALT 22, AP 218  CEA and CA 2729 pending Studies/Results: Dg Chest 1 View  09/24/2011  *RADIOLOGY REPORT*  Clinical Data: Short of breath, history breast carcinoma  CHEST - 1 VIEW  Comparison: None.  Findings: The lungs are clear and slightly hyperaerated. Mediastinal contours appear normal.  The heart is within upper limits of normal.  Surgical clips overlie the left axilla and changes of left mastectomy are noted.  There may be compression deformity of T7 and possibly T8 vertebral bodies.  IMPRESSION:  1.  No active lung disease. 2.  Cannot exclude compression deformities in the mid thoracic spine on the frontal view.  Original Report Authenticated By: Juline Patch, M.D.   Dg Sternum  09/24/2011  *RADIOLOGY REPORT*  Clinical Data: Sternal pain  STERNUM - 2+ VIEW  Comparison: None  Findings: The lateral view of the sternum is suboptimal with overlapping breast tissue.  No obvious lytic or blastic lesion is seen.  No retrosternal soft tissue swelling is noted.  IMPRESSION: Suboptimal lateral view of the sternum but no obvious bony abnormality is seen.  Original Report Authenticated By: Juline Patch, M.D.   Dg Femur Left  09/24/2011  *RADIOLOGY REPORT*  Clinical Data: Intraoperative images, ORIF left femur fracture.  LEFT  FEMUR - 2 VIEW  Comparison: 09/24/2011 CT  Findings: Four fluoroscopic spot images obtained intraoperatively, demonstrating intramedullary rod and dynamic hip screw fixation of the patient's pathologic femur fracture. Hardware is grossly intact and in expected alignment.  IMPRESSION: ORIF left femur.  Original Report Authenticated By: Waneta Martins, M.D.   Dg Femur Right  09/24/2011  *RADIOLOGY REPORT*  Clinical Data: Pain on standing  RIGHT FEMUR - 2 VIEW   Comparison: None.  Findings: The right femur is intact.  There is mild degenerative joint disease of the right hip.  No acute abnormality is seen.  IMPRESSION: Negative right femur other than degenerative joint disease of the right hip.  Original Report Authenticated By: Juline Patch, M.D.   Dg Tibia/fibula Left  09/24/2011  *RADIOLOGY REPORT*  Clinical Data: Pain on standing  LEFT TIBIA AND FIBULA - 2 VIEW  Comparison: None.  Findings: There is tricompartmental degenerative joint disease of the right knee primarily involving the medial and patellofemoral compartments.  Faint chondrocalcinosis of the right knee also is noted which may be seen with osteoarthritis or CPPD.  No acute fracture is seen.  IMPRESSION:  1.  No fracture. 2.  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  Original Report Authenticated By: Juline Patch, M.D.   Dg Tibia/fibula Right  09/24/2011  *RADIOLOGY REPORT*  Clinical Data: Pain on standing  RIGHT TIBIA AND FIBULA - 2 VIEW  Comparison: None.  Findings: There is mild tricompartmental degenerative joint disease of the right knee.  Faint chondrocalcinosis is seen.  No joint effusion is noted.  The right tibia and fibula are intact.  IMPRESSION:  1.  Negative right tibia and fibula. 2.  Tricompartmental degenerative joint disease of the right knee with chondrocalcinosis as well.  Original Report Authenticated By: Juline Patch, M.D.   Ct Chest W Contrast  09/24/2011  *RADIOLOGY REPORT*  Clinical Data:  Metastatic cancer to bone.  History of breast cancer.  Sternal pain.  Shortness of breath.  Staging.  CT CHEST, ABDOMEN AND PELVIS WITH CONTRAST  Technique:  Multidetector CT imaging of the chest, abdomen and pelvis was performed following the standard protocol during bolus administration of intravenous contrast.  Contrast: OMNIPAQUE IOHEXOL 300 MG/ML  SOLN  Comparison:   None.  CT CHEST  Findings:  Heart is normal size. Aorta is normal caliber.   Density is noted in the left apex, likely scarring.  Areas of scattered scarring in the lingula and lung bases. Anterior left lung scarring may be related to prior chest wall radiation.  No pulmonary nodules or pleural effusions. Heart is normal size. Aorta is normal caliber. No mediastinal, hilar, or axillary adenopathy.  Surgical changes in the left breast.  Surgical clips in the left axilla.  Chest wall soft tissues otherwise unremarkable.  Mixed lytic and sclerotic lesions noted in the mid thoracic spine at T5 through T8.  Slight compression of the T8 vertebral body.  No definite sternal abnormality.  Several subtle lesions are seen within the ribs bilaterally, either lucent or mottled appearance compatible with metastases.  Probable pathologic fracture within the posterior right ninth rib.  Healing left 10th rib fracture.  IMPRESSION: Areas of scarring in the lungs bilaterally, most pronounced in the left apex.  Scarring anteriorly in the lingula.  Areas of scarring in the left lung could be related to prior radiation to the left breast and chest wall.  Postoperative changes in the left breast.  Numerous bone  metastases throughout the thoracic spine and ribs. Pathologic fractures within the ribs bilaterally.  Slight compression through the superior aspect of T8.  CT ABDOMEN AND PELVIS  Findings:  Large solid mass centrally within the liver measuring 4.4 cm compatible with metastasis.  Other smaller metastases within both the left and right hepatic lobes (at least three others). Hypodense lesion within the spleen could also reflect metastasis. This measures 1.5 cm.  Pancreas, adrenals, right kidney are unremarkable.  There is severe left hydronephrosis.  No renal or ureteral stones. The left ureter is decompressed.  There is suggestion of abnormal soft tissue in the left retroperitoneum at the proximal left ureter, possible soft tissue metastasis.  This is along the anterior left psoas muscle.  Borderline sized  retroperitoneal lymph nodes and retrocrural lymph nodes.  Mildly prominent central mesenteric lymph nodes as well. Index left periaortic node on image 71 has a short axis diameter of 7 mm.  Index node on image 66 has a short axis diameter of 7 mm.  Uterus and adnexa are unremarkable.  There is a small amount of free fluid in the pelvis.  Large and small bowel grossly unremarkable.  Bone metastases within the lumbar spine, best seen at L1 with mixed lytic and sclerotic appearance.  Probable metastases within the left iliac crest and proximal left femoral shaft.  IMPRESSION: Numerous hepatic metastases.  Probable metastasis within the spleen.  Marked left hydronephrosis.  There appears to be subtle abnormal soft tissue adjacent to the proximal left ureter just anterior to the left psoas muscle, presumable retroperitoneal metastasis.  Borderline retrocrural, mesenteric and retroperitoneal lymph nodes.  Numerous bony metastases.  Small amount of free fluid in the pelvis.  Original Report Authenticated By: Cyndie Chime, M.D.   Ct Abdomen Pelvis W Contrast  09/24/2011  *RADIOLOGY REPORT*  Clinical Data:  Metastatic cancer to bone.  History of breast cancer.  Sternal pain.  Shortness of breath.  Staging.  CT CHEST, ABDOMEN AND PELVIS WITH CONTRAST  Technique:  Multidetector CT imaging of the chest, abdomen and pelvis was performed following the standard protocol during bolus administration of intravenous contrast.  Contrast: OMNIPAQUE IOHEXOL 300 MG/ML  SOLN  Comparison:   None.  CT CHEST  Findings:  Heart is normal size. Aorta is normal caliber.  Density is noted in the left apex, likely scarring.  Areas of scattered scarring in the lingula and lung bases. Anterior left lung scarring may be related to prior chest wall radiation.  No pulmonary nodules or pleural effusions. Heart is normal size. Aorta is normal caliber. No mediastinal, hilar, or axillary adenopathy.  Surgical changes in the left breast.  Surgical  clips in the left axilla.  Chest wall soft tissues otherwise unremarkable.  Mixed lytic and sclerotic lesions noted in the mid thoracic spine at T5 through T8.  Slight compression of the T8 vertebral body.  No definite sternal abnormality.  Several subtle lesions are seen within the ribs bilaterally, either lucent or mottled appearance compatible with metastases.  Probable pathologic fracture within the posterior right ninth rib.  Healing left 10th rib fracture.  IMPRESSION: Areas of scarring in the lungs bilaterally, most pronounced in the left apex.  Scarring anteriorly in the lingula.  Areas of scarring in the left lung could be related to prior radiation to the left breast and chest wall.  Postoperative changes in the left breast.  Numerous bone metastases throughout the thoracic spine and ribs. Pathologic fractures within the ribs bilaterally.  Slight  compression through the superior aspect of T8.  CT ABDOMEN AND PELVIS  Findings:  Large solid mass centrally within the liver measuring 4.4 cm compatible with metastasis.  Other smaller metastases within both the left and right hepatic lobes (at least three others). Hypodense lesion within the spleen could also reflect metastasis. This measures 1.5 cm.  Pancreas, adrenals, right kidney are unremarkable.  There is severe left hydronephrosis.  No renal or ureteral stones. The left ureter is decompressed.  There is suggestion of abnormal soft tissue in the left retroperitoneum at the proximal left ureter, possible soft tissue metastasis.  This is along the anterior left psoas muscle.  Borderline sized retroperitoneal lymph nodes and retrocrural lymph nodes.  Mildly prominent central mesenteric lymph nodes as well. Index left periaortic node on image 71 has a short axis diameter of 7 mm.  Index node on image 66 has a short axis diameter of 7 mm.  Uterus and adnexa are unremarkable.  There is a small amount of free fluid in the pelvis.  Large and small bowel grossly  unremarkable.  Bone metastases within the lumbar spine, best seen at L1 with mixed lytic and sclerotic appearance.  Probable metastases within the left iliac crest and proximal left femoral shaft.  IMPRESSION: Numerous hepatic metastases.  Probable metastasis within the spleen.  Marked left hydronephrosis.  There appears to be subtle abnormal soft tissue adjacent to the proximal left ureter just anterior to the left psoas muscle, presumable retroperitoneal metastasis.  Borderline retrocrural, mesenteric and retroperitoneal lymph nodes.  Numerous bony metastases.  Small amount of free fluid in the pelvis.  Original Report Authenticated By: Cyndie Chime, M.D.   Dg Humerus Left  09/24/2011  *RADIOLOGY REPORT*  Clinical Data: Pain on standing  LEFT HUMERUS - 2+ VIEW  Comparison: None.  Findings: The left humerus is intact.  No lytic or blastic lesion is seen.  The left glenohumeral joint space appears normal at the left [facility] joint is normally aligned.  There is cortical irregularity of the posterior left sixth rib which may be due to healing fracture although a metastatic lesion cannot be excluded.  IMPRESSION:  1.  Negative left humerus. 2.  Healing fracture or pathological lesion involving the left posterior sixth rib.  Original Report Authenticated By: Juline Patch, M.D.   Dg Humerus Right  09/24/2011  *RADIOLOGY REPORT*  Clinical Data: Pain on standing  RIGHT HUMERUS - 2+ VIEW  Comparison: None.  Findings: The right humerus appears intact.  No lytic or blastic lesion is seen.  The right [facility] joint is normally aligned.  The right ribs are visualized appear normal.  IMPRESSION: Negative right humerus.  Original Report Authenticated By: Juline Patch, M.D.   Dg C-arm 1-60 Min-no Report  09/24/2011  CLINICAL DATA: intra op   C-ARM 1-60 MINUTES  Fluoroscopy was utilized by the requesting physician.  No radiographic  interpretation.      Discussed with Dr Shelle Iron at bedside, as he is rounding for orthopedics  today.  Discussed bone and liver findings from CTs with patient now. She understands that path is pending from orthopedic surgical specimen. Not discussed, but we should be able to FNA liver if other path needed. I have recommended giving IV bisphosphonate today and patient agrees.   Assessment/Plan: 1. 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 I expect she can be discharged when appropriate  from orthopedics standpoint, to continue management outpatient then. Path pending. PT/OT requested. Resume POs. Prophylactic lovenox. IV bisphosphonate today. She will need RT consult (Dr Dayton Scrape treated with primary breast diagnosis) during or after hospitalization. 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 Will try to discuss with urology tomorrow. 4.limited peripheral IV access as cannot use LUE. Will try to convert to NSL after pamidronate.  5.osteoporosis 6.degenerative changes left knee: orthopedics aware 7.history anxiety, prn ativan helpful. She is tolerating all of this relatively well so far. 8. Hypothyroid on replacement, followed by Dr Altheimer  Jama Flavors P

## 2011-09-25 NOTE — Progress Notes (Signed)
Subjective: 1 Day Post-Op Procedure(s) (LRB): INTRAMEDULLARY (IM) NAIL FEMORAL (Left) Patient reports pain as mild.   Denies CP or SOB. Patient is complaining left knee pain Objective: Vital signs in last 24 hours: Temp:  [96.8 F (36 C)-99.4 F (37.4 C)] 98.1 F (36.7 C) (07/04 0542) Pulse Rate:  [71-97] 89  (07/04 0542) Resp:  [16-21] 16  (07/04 0542) BP: (100-147)/(55-77) 100/62 mmHg (07/04 0542) SpO2:  [94 %-100 %] 95 % (07/04 0542) Weight:  [63.6 kg (140 lb 3.4 oz)] 63.6 kg (140 lb 3.4 oz) (07/03 1034)  Intake/Output from previous day: 07/03 0701 - 07/04 0700 In: 2017 [I.V.:2017] Out: 1425 [Urine:1350; Blood:75] Intake/Output this shift:     Basename 09/24/11 1438  HGB 10.8*    Basename 09/24/11 1438  WBC 12.6*  RBC 3.57*  HCT 32.8*  PLT 206    Basename 09/24/11 1438  NA 139  K 3.6  CL 100  CO2 26  BUN 13  CREATININE 0.77  GLUCOSE 94  CALCIUM 10.7*    Basename 09/24/11 1438  LABPT --  INR 1.20    Neurologically intact Neurovascular intact Sensation intact distally Dorsiflexion/Plantar flexion intact Incision: dressing C/D/I Compartment soft  Assessment/Plan: 1 Day Post-Op Procedure(s) (LRB): INTRAMEDULLARY (IM) NAIL FEMORAL (Left) Advance diet Up with therapy Cont current care per oncology If has cont knee pain consider cortisone injection in a few days once past initial post operative period xrays reviewed no lesion seen just DJD  Skylene Deremer 09/25/2011, 8:11 AM

## 2011-09-25 NOTE — Evaluation (Signed)
Physical Therapy Evaluation Patient Details Name: COLEY LITTLES MRN: [id number] DOB: [birth year] Today's Date: 09/25/2011 Time: 1340-1403 PT Time Calculation (min): 23 min  PT Assessment / Plan / Recommendation Clinical Impression  Pt presents s/p L femur IM nail with history of breast cancer with mets to bones.  Tolerated ambulation well with RW, however ambulation limited on eval due to pt stating she felt hot.  BP remained normal throughout session.  Pt will benefit from skilled PT in acute venue to address deficits.  PT recommends HHPT for follow up therapy at D/C.     PT Assessment  Patient needs continued PT services    Follow Up Recommendations  Home health PT    Barriers to Discharge None      Equipment Recommendations  Rolling walker with 5" wheels    Recommendations for Other Services     Frequency 7X/week    Precautions / Restrictions Precautions Precautions: Fall Restrictions Weight Bearing Restrictions: No LLE Weight Bearing: Weight bearing as tolerated   Pertinent Vitals/Pain 4/10      Mobility  Bed Mobility Bed Mobility: Supine to Sit Supine to Sit: 4: Min assist;With rails;HOB elevated Details for Bed Mobility Assistance: Min cues for sequencing and hand placement.  Transfers Transfers: Sit to Stand;Stand to Sit Sit to Stand: 4: Min assist;With upper extremity assist;From bed Stand to Sit: 4: Min assist;With upper extremity assist;With armrests;To chair/3-in-1 Details for Transfer Assistance: Cues for hand placement and LE management.  Ambulation/Gait Ambulation/Gait Assistance: 4: Min assist Ambulation Distance (Feet): 40 Feet Assistive device: Rolling walker Ambulation/Gait Assistance Details: Cues for sequencing/technique with RW and to maintain upright posture.  BP remained stable throughout treatment, however pt stated she was feeling hot while ambulating in hallway, therefore had pt return to chair.   Gait Pattern: Step-to pattern;Trunk  flexed;Decreased stance time - left;Decreased step length - right Gait velocity: decreased Stairs: No Wheelchair Mobility Wheelchair Mobility: No    Exercises     PT Diagnosis: Difficulty walking;Abnormality of gait;Generalized weakness;Acute pain  PT Problem List: Decreased strength;Decreased range of motion;Decreased activity tolerance;Decreased balance;Decreased mobility;Decreased knowledge of use of DME;Pain PT Treatment Interventions: DME instruction;Gait training;Stair training;Functional mobility training;Patient/family education;Therapeutic activities;Therapeutic exercise;Balance training   PT Goals Acute Rehab PT Goals PT Goal Formulation: With patient Time For Goal Achievement: 09/29/11 Potential to Achieve Goals: Good Pt will go Sit to Supine/Side: with supervision PT Goal: Sit to Supine/Side - Progress: Goal set today Pt will go Sit to Stand: with supervision PT Goal: Sit to Stand - Progress: Goal set today Pt will go Stand to Sit: with supervision PT Goal: Stand to Sit - Progress: Goal set today Pt will Ambulate: 51 - 150 feet;with supervision;with least restrictive assistive device PT Goal: Ambulate - Progress: Goal set today Pt will Go Up / Down Stairs: 1-2 stairs;with min assist;with least restrictive assistive device PT Goal: Up/Down Stairs - Progress: Goal set today  Visit Information  Last PT Received On: 09/25/11 Assistance Needed: +1 PT/OT Co-Evaluation/Treatment: Yes    Subjective Data  Subjective: Do I really need therapy at home? Patient Stated Goal: To get back home   Prior Functioning  Home Living Lives With: Spouse Available Help at Discharge: Family;Available 24 hours/day Type of Home: House Home Access: Stairs to enter [employer] of Steps: 2 Entrance Stairs-Rails: None Home Layout: Two level;Able to live on main level with bedroom/bathroom Bathroom Shower/Tub: Tub/shower unit;Walk-in shower Bathroom Toilet: Standard Home  Adaptive Equipment: Crutches Prior Function Level of Independence: Independent Able to  Take Stairs?: Yes Driving: Yes Vocation: Retired Musician: No difficulties    Cognition  Overall Cognitive Status: Appears within functional limits for tasks assessed/performed Arousal/Alertness: Awake/alert Orientation Level: Appears intact for tasks assessed Behavior During Session: [facility] for tasks performed    Extremity/Trunk Assessment Right Upper Extremity Assessment RUE ROM/Strength/Tone: [facility] for tasks assessed Left Upper Extremity Assessment LUE ROM/Strength/Tone: WFL for tasks assessed Right Lower Extremity Assessment RLE ROM/Strength/Tone: WFL for tasks assessed RLE Coordination: WFL - gross motor Left Lower Extremity Assessment LLE ROM/Strength/Tone: Unable to fully assess;Due to pain LLE Coordination: WFL - gross motor Trunk Assessment Trunk Assessment: Kyphotic   Balance    End of Session PT - End of Session Activity Tolerance: Patient tolerated treatment well;Patient limited by fatigue Patient left: in chair;with call bell/phone within reach;with family/visitor present Nurse Communication: Mobility status  GP     Page, Meribeth Mattes 09/25/2011, 2:52 PM

## 2011-09-26 ENCOUNTER — Telehealth: Payer: Self-pay | Admitting: Oncology

## 2011-09-26 ENCOUNTER — Other Ambulatory Visit: Payer: Self-pay | Admitting: Oncology

## 2011-09-26 DIAGNOSIS — C50919 Malignant neoplasm of unspecified site of unspecified female breast: Secondary | ICD-10-CM

## 2011-09-26 DIAGNOSIS — E039 Hypothyroidism, unspecified: Secondary | ICD-10-CM

## 2011-09-26 DIAGNOSIS — D649 Anemia, unspecified: Secondary | ICD-10-CM

## 2011-09-26 DIAGNOSIS — M84453A Pathological fracture, unspecified femur, initial encounter for fracture: Secondary | ICD-10-CM

## 2011-09-26 DIAGNOSIS — M1710 Unilateral primary osteoarthritis, unspecified knee: Secondary | ICD-10-CM

## 2011-09-26 LAB — COMPREHENSIVE METABOLIC PANEL
ALT: 15 U/L (ref 0–35)
Alkaline Phosphatase: 203 U/L — ABNORMAL HIGH (ref 39–117)
CO2: 26 mEq/L (ref 19–32)
Calcium: 9.5 mg/dL (ref 8.4–10.5)
GFR calc Af Amer: 85 mL/min — ABNORMAL LOW (ref >90)
GFR calc non Af Amer: 73 mL/min — ABNORMAL LOW (ref >90)
Glucose, Bld: 158 mg/dL — ABNORMAL HIGH (ref 70–99)
Sodium: 133 mEq/L — ABNORMAL LOW (ref 135–145)

## 2011-09-26 LAB — CBC
Hemoglobin: 8.9 g/dL — ABNORMAL LOW (ref 12.0–15.0)
MCH: 30.4 pg (ref 26.0–34.0)
RBC: 2.93 MIL/uL — ABNORMAL LOW (ref 3.87–5.11)

## 2011-09-26 LAB — CANCER ANTIGEN 27.29: CA 27.29: 354 U/mL — ABNORMAL HIGH (ref 0–39)

## 2011-09-26 MED ORDER — LORAZEPAM 0.5 MG PO TABS: 1.0000 mg | ORAL_TABLET | Freq: Three times a day (TID) | ORAL | Status: DC | PRN

## 2011-09-26 MED ORDER — HYDROCODONE-ACETAMINOPHEN 5-325 MG PO TABS: 1.0000 | ORAL_TABLET | ORAL | Status: DC | PRN

## 2011-09-26 MED ORDER — SENNOSIDES-DOCUSATE SODIUM 8.6-50 MG PO TABS: 2.0000 | ORAL_TABLET | Freq: Two times a day (BID) | ORAL | Status: DC

## 2011-09-26 MED ORDER — ENOXAPARIN (LOVENOX) PATIENT EDUCATION KIT
PACK | Freq: Once | Status: DC
  Filled 2011-09-26: qty 1

## 2011-09-26 MED ORDER — HYDROMORPHONE HCL 2 MG PO TABS: 2.0000 mg | ORAL_TABLET | ORAL | Status: DC | PRN

## 2011-09-26 MED ORDER — ENOXAPARIN SODIUM 40 MG/0.4ML ~~LOC~~ SOLN: 40.0000 mg | SUBCUTANEOUS | Status: DC

## 2011-09-26 NOTE — Discharge Summary (Signed)
Physician Discharge Summary  Patient ID: Kelli [name] MRN: [id number] DOB: Aug 10, 1940   Admit date: 09/24/2011 Discharge date: 09/26/2011    Discharge Diagnoses:  Pathologic fracture of left femur. 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 History of breast cancer  Discharged Condition: stable See H&P for additional details on admission, past history, family history, social history and admission physical exam.  Hospital Course: Patient was admitted to oncology service on 09-24-11 with apparent pathologic fracture of left femoral neck found on MRI by another of her physicians done 09-20-11. She was kept nonweight bearing initially, pain managed with hydrocodone and dilaudid. She had plain Xrays of long bones upper and lower extremities to be sure no other impending fractures, these negative. She had CT chest/abdomen/ pelvis for additional staging, with other areas of bone involvement, apparent liver metastases and left hydronephrosis without hydroureter. She was seen in consultation by Dr Victorino Dike of orthopedics and taken to surgery evening of admission, with IM nailing accomplished without problems. She required IV dilaudid for pain control initially, but was comfortable with po pain medication prior to discharge. She was able to resume full weight bearing post operatively, did use walker with PT/OT. She had slight irritation at IV site which improved with DC of IV. She received pamidronate on 09-25-11. Repeat labs 09-26-11 had calcium of 9.5 with albumin 2.9, AST 45, alk phos ~ 200 and hgb 8.9 which will be followed up outpatient. She had no bleeding other than with surgery.  Orthopedics recommends lovenox daily x 2 wks (or alternatively xarelto x 2 wks) and follow up with Dr Victorino Dike in 2 wks; she has been scheduled to see radiation oncology on July 10 and back to Dr Darrold Span on July 15, by which time pathology information should be available from the specimen submitted with  surgery.  Consults: Dr Toni Arthurs, orthopedics  Significant Diagnostic Studies: CT chest/abdomen/pelvis, plain X rays of long bones, pathology pending  Treatments: IM nailing of pathologic fracture of left femur, IV pamidronate, pain medication, PT/OT  Discharge Exam: Blood pressure 112/69, pulse 102, temperature 99.1 F (37.3 C), temperature source Oral, resp. rate 20, height 5\' 4"  (1.626 m), weight 140 lb 3.4 oz (63.6 kg), SpO2 92.00%. Comfortable up in chair, husband here. Respirations not labored RA.  PERRL. Oral mucosa clear. Lungs clear. Cor RRR. Peripheral IV RUE out, area above IV site not tender. Abdomen soft, not tender, some BS. LE no edema or cords bilat. 2 incisions left upper leg covered with small dressings. Slightly pink between the areas, not tender or warm. Left knee unchanged. Moves extremities easily .   Disposition: 01-Home or Self Care  Discharge Orders    Future Appointments: Provider: Department: Dept Phone: Center:   10/01/2011 10:30 AM [facility] [phone] None   10/01/2011 11:00 AM Billie Lade, MD [facility] [phone] None   10/06/2011 2:30 PM Windell Hummingbird [facility] [phone] None   10/06/2011 3:00 PM Reece Packer, MD [facility] [phone] None   10/20/2011 1:30 PM Windell Hummingbird [facility] [phone] None   04/21/2012 11:30 AM Mauri Brooklyn [facility] [phone] None   04/21/2012 12:00 PM Hank Walling Buzzy Han, MD [facility] [phone] None     Medication List  As of 09/26/2011  4:10 PM   ASK your doctor about these medications         CALTRATE 600+D 600-400 MG-UNIT per tablet   Generic drug: Calcium Carbonate-Vitamin D   Take 1 tablet by  mouth 2 (two) times daily.      Cholecalciferol 5000 UNITS Tabs   Take 1 tablet by mouth daily.      COSAMIN DS PO   Take 1 tablet by mouth daily.      FLUoxetine 20 MG capsule   Commonly known as: PROZAC   Take 1 capsule (20 mg total) by  mouth daily.      HYDROcodone-acetaminophen 5-500 MG per tablet   Commonly known as: VICODIN   Take 0.5 tablets by mouth every 6 (six) hours as needed. For pain      ibuprofen 200 MG tablet   Commonly known as: ADVIL,MOTRIN   Take 400 mg by mouth every 8 (eight) hours as needed. For pain.      levothyroxine 75 MCG tablet   Commonly known as: SYNTHROID, LEVOTHROID   Take 75 mcg by mouth daily.      LORazepam 1 MG tablet   Commonly known as: ATIVAN   Take 0.5-1 mg by mouth every 8 (eight) hours as needed. For anxiety.      traMADol 50 MG tablet   Commonly known as: ULTRAM   Take 50 mg by mouth every 6 (six) hours as needed. For pain      vitamin B-12 1000 MCG tablet   Commonly known as: CYANOCOBALAMIN   Take 1,000 mcg by mouth daily.      zolpidem 5 MG tablet   Commonly known as: AMBIEN   Take 1 tablet (5 mg total) by mouth at bedtime as needed.          No ibuprofen. Dilaudid 2 mg every 4 hrs as needed for pain not controlled with hydrocodone. Laxatives as instructed  Lovenox 40 mg SQ daily x 2 weeks per Dr Victorino Dike  Diet as tolerated.  Home PT to continue rehab for LE surgery Home RN to follow up self lovenox injections. Follow-up Information    Follow up with Reece Packer, MD on 10/06/2011. (2:30pm. Draw blood first then see MD)    Contact information:   [address] [place] [address] [phone]        Follow up with Dr Toni Arthurs in ~ 2 weeks. Call his office on 09-29-11 to set up appointment  [phone]  Appointment at [facility] with Dr Antony Blackbird 10-01-11 as instructed ([phone])  Patient also seen for full rounds this am -- see my AM note today. Discussed with Dr Victorino Dike by phone, coordinated radiation oncology appointment, discussed with case manager and RN on floor now.    Signed: Tahiry Spicer P 09/26/2011, 4:10 PM

## 2011-09-26 NOTE — Progress Notes (Signed)
Occupational Therapy Treatment Patient Details Name: Kelli [name] MRN: [id number] DOB: November 01, 1940 Today's Date: 09/26/2011 Time: 0865-7846 OT Time Calculation (min): 9 min  OT Assessment / Plan / Recommendation Comments on Treatment Session Pt plans to discharge home today. She has no concerns at this time.  Finished education about AE and pt verbalizes.  She is moving with supervision    Follow Up Recommendations  No OT follow up    Barriers to Discharge       LEquipment Recommendations  Rolling walker with 5" wheels    Recommendations for Other Services    Frequency Min 2X/week   Plan      Precautions / Restrictions Precautions Precautions: Fall Restrictions Weight Bearing Restrictions: No LLE Weight Bearing: Weight bearing as tolerated   Pertinent Vitals/Pain L hip sore with weight bearing; repositioned    ADL  Toilet Transfer: Simulated;Supervision/safety (ambulated around bed to chair) Equipment Used: Sock aid;Reacher;Rolling walker Transfers/Ambulation Related to ADLs: supervision ambulating to chair ADL Comments: Pt has help at home. She has a Sports administrator already.  Educated on ADL uses.  Also educated on sock aid.  She is able to don one sock at this time.      OT Diagnosis:    OT Problem List:   OT Treatment Interventions:     OT Goals Acute Rehab OT Goals Time For Goal Achievement: 10/02/11 ADL Goals Pt Will Perform Lower Body Dressing: with supervision;Sit to stand from bed;Sit to stand from chair ADL Goal: Lower Body Dressing - Progress: Progressing toward goals Pt Will Transfer to Toilet: with supervision;Ambulation;Regular height toilet;3-in-1 ADL Goal: Toilet Transfer - Progress: Progressing toward goals  Visit Information  Last OT Received On: 09/26/11 Assistance Needed: +1    Subjective Data      Prior Functioning       Cognition  Overall Cognitive Status: Appears within functional limits for tasks assessed/performed Arousal/Alertness:  Awake/alert Orientation Level: Appears intact for tasks assessed Behavior During Session: [facility] for tasks performed    Mobility Bed Mobility Bed Mobility: Supine to Sit Supine to Sit: 4: Min assist;With rails;HOB elevated Details for Bed Mobility Assistance: Assist for LLE with cues for technique.  Transfers Sit to Stand: 5: Supervision;With upper extremity assist;From bed Stand to Sit: 5: Supervision;With upper extremity assist;With armrests;To chair/3-in-1 Details for Transfer Assistance: Supervision for safety with min cues for ensuring correct position when sitting.    Exercises    Balance    End of Session OT - End of Session Activity Tolerance: Patient tolerated treatment well Patient left: in chair;with call bell/phone within reach  GO     Travanti Mcmanus 09/26/2011, 1:28 PM Marica Otter, OTR/L [phone] 09/26/2011

## 2011-09-26 NOTE — Progress Notes (Signed)
Spoke with pt concerning Home Health PT, RN, [place].  Pt feel that she do not need Home Health or HHPT. I explained to her the benefits of Home Health and she continued to say she did not need Home Health at this time. Encouraged pt to call PCP if she change her mind at discharge. mp

## 2011-09-26 NOTE — Progress Notes (Signed)
Physical Therapy Treatment Patient Details Name: Kelli [name] MRN: [id number] DOB: March 20, 1941 Today's Date: 09/26/2011 Time: 7829-5621 PT Time Calculation (min): 14 min  PT Assessment / Plan / Recommendation Comments on Treatment Session  Pt progressing very well with ambulation and did well on stair training.  Pt for possible D/C tomorrow.  Will practice stairs again before D/C.     Follow Up Recommendations  Home health PT    Barriers to Discharge        Equipment Recommendations  Rolling walker with 5" wheels    Recommendations for Other Services    Frequency 7X/week   Plan Discharge plan remains appropriate    Precautions / Restrictions Precautions Precautions: Fall Restrictions Weight Bearing Restrictions: No LLE Weight Bearing: Weight bearing as tolerated   Pertinent Vitals/Pain 6/10    Mobility  Bed Mobility Bed Mobility: Supine to Sit Supine to Sit: 4: Min assist;With rails;HOB elevated Details for Bed Mobility Assistance: Assist for LLE with cues for technique.  Transfers Transfers: Sit to Stand;Stand to Sit Sit to Stand: 5: Supervision;With upper extremity assist;From bed Stand to Sit: 5: Supervision;With upper extremity assist;With armrests;To chair/3-in-1 Details for Transfer Assistance: Supervision for safety with min cues for ensuring correct position when sitting.  Ambulation/Gait Ambulation/Gait Assistance: 5: Supervision Ambulation Distance (Feet): 120 Feet Assistive device: Rolling walker Ambulation/Gait Assistance Details: Min cues for sequencing/technique and for relaxed posture.  Gait Pattern: Step-to pattern;Trunk flexed;Decreased stance time - left;Decreased step length - right Gait velocity: decreased Stairs: Yes Stairs Assistance: 4: Min guard Stair Management Technique: No rails;Step to pattern;Backwards;Forwards;With walker Number of Stairs: 3  Wheelchair Mobility Wheelchair Mobility: No    Exercises     PT Diagnosis:    PT  Problem List:   PT Treatment Interventions:     PT Goals Acute Rehab PT Goals PT Goal Formulation: With patient Time For Goal Achievement: 09/29/11 Potential to Achieve Goals: Good Pt will go Sit to Supine/Side: with supervision PT Goal: Sit to Supine/Side - Progress: Progressing toward goal Pt will go Sit to Stand: with supervision PT Goal: Sit to Stand - Progress: Met Pt will go Stand to Sit: with supervision PT Goal: Stand to Sit - Progress: Met Pt will Ambulate: 51 - 150 feet;with supervision;with least restrictive assistive device PT Goal: Ambulate - Progress: Met Pt will Go Up / Down Stairs: 1-2 stairs;with min assist;with least restrictive assistive device PT Goal: Up/Down Stairs - Progress: Progressing toward goal  Visit Information  Last PT Received On: 09/26/11 Assistance Needed: +1    Subjective Data  Subjective: I'm not feeling as loopy today Patient Stated Goal: To get back home   Cognition  Overall Cognitive Status: Appears within functional limits for tasks assessed/performed Arousal/Alertness: Awake/alert Orientation Level: Appears intact for tasks assessed Behavior During Session: [facility] for tasks performed    Balance     End of Session PT - End of Session Activity Tolerance: Patient tolerated treatment well Patient left: in chair;with call bell/phone within reach Nurse Communication: Mobility status   GP     Page, Meribeth Mattes 09/26/2011, 10:29 AM

## 2011-09-26 NOTE — Progress Notes (Signed)
  09/26/2011, 8:34 AM  Hospital day: 3, Post op day 2 IM nail left femur Antibiotics: none Chemotherapy: none.  Pamidronate given 09-25-11  Subjective: Awake, alert, able to sleep last pm. Still pain left knee and lower leg, using some IV pain medication still. IV site not uncomfortable but slightly pink and will remove, which will also allow blood draw from that arm for CBC/CMET. Received pamidronate without difficulty yesterday. Walked in hall with PT, using walker. Able to eat. No BM since PTA, on BID senokotS (2 tabs bid).   Objective: Vital signs in last 24 hours: Blood pressure 103/63, pulse 110, temperature 99.2 F (37.3 C), temperature source Oral, resp. rate 18, height 5\' 4"  (1.626 m), weight 140 lb 3.4 oz (63.6 kg), SpO2 93.00%.  Wearing [place] O2 tho all O2 sats recorded are > 90%. PERRL. Oral mucosa clear. Lungs clear. Cor RRR. Peripheral IV RUE infusing easily, however slightly pink above dressing, not tender, 3x4 cm. Abdomen soft, not tender, some BS. LE no edema or cords bilat. 2 incisions left upper leg covered with small dressings. Slightly pink between the areas, not tender or warm. Left knee unchanged. Moves extremities easily in bed. Intake/Output from previous day: 07/04 0701 - 07/05 0700 In: 1283.8 [P.O.:1040; I.V.:243.8] Out: 1250 [Urine:1250] Intake/Output this shift:      Lab Results:  Basename 09/24/11 1438  WBC 12.6*  HGB 10.8*  HCT 32.8*  PLT 206   BMET  Basename 09/24/11 1438  NA 139  K 3.6  CL 100  CO2 26  GLUCOSE 94  BUN 13  CREATININE 0.77  CALCIUM 10.7*   CBC and CMET to be drawn this am after IV [facility]  Pathology from orthopedic procedure pending. Studies/Results: No new radiololgic studies in past 24 hrs.   Assessment/Plan: 1.Apparent metastatic disease to bone and liver, post IM nail left femur for pathologic fracture on 09-24-11. Will DC home when ok per orthopedics and complete work up/ continue treatment as outpatient. Post pamidronate  09-25-11. RT consult set up with Dr Antony Blackbird for 10-01-11 1030.  2. 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 Path from ortho procedure still pending. I have discussed with pathologist now -- she will check specimen which is not grossed in yet, in case can do scraping and avoid decalcification. 3.osteoarthritis left knee 4.peripheral IV site irritation: see orders  I have asked RN to let PT know ready for DC when they/ ortho tell us appropriate. LIVESAY,LENNIS P

## 2011-09-26 NOTE — Progress Notes (Signed)
Patient provided with discharge instructions and teaching on lovenox injection. Patient verbalized understanding. Patient discharged to home.

## 2011-09-26 NOTE — Telephone Encounter (Signed)
Talked to Christina,pt's nurse @ [facility], gave her appt date for 7/15 lab and MD as a follow up after d/c from hospital

## 2011-09-26 NOTE — Clinical Social Work Psychosocial (Signed)
Clinical Social Work Department BRIEF PSYCHOSOCIAL ASSESSMENT 09/26/2011  Patient:  Kelli [name],Kelli [name]     Account Number:  [id number]     Admit date:  09/24/2011  Clinical Social Worker:  Etta Grandchild  Date/Time:  09/26/2011 03:00 PM  Referred by:  Physician  Date Referred:  09/24/2011 Referred for  Advanced Directives   Other Referral:   Interview type:  Patient Other interview type:   & family    PSYCHOSOCIAL DATA Living Status:  HUSBAND Admitted from facility:   Level of care:   Primary support name:  robert Markus Primary support relationship to patient:  SPOUSE Degree of support available:   very good    CURRENT CONCERNS Current Concerns  Other - See comment   Other Concerns:   completing advance directives    SOCIAL WORK ASSESSMENT / PLAN CSW met with patient and patient's spouse in room to complete advance directives. The family has already reviewed the documents and CSW reviewed again.  Mr. and Mrs. Gosa designated each other as their healthcare power of attorneys. [name] stated she would not like life prolonging measures in end of life scenarios designated in her living will.  CSW placed documents in chart to be scanned in.  Once chart scanned in, documents can be viewed under Chart Review > Media Tab > titled Advance Directives.  CSW encouraged patient and spouse to contact regarding additional questions or concerns at cancer center.   Assessment/plan status:  No Further Intervention Required Other assessment/ plan:   Information/referral to community resources:   completed advance directives    PATIENT'S/FAMILY'S RESPONSE TO PLAN OF CARE: patient and spouse relieved to complete directives.   Kathrin Penner, MSW, [facility] Clinical Social Worker [facility] [phone]

## 2011-09-29 ENCOUNTER — Telehealth: Payer: Self-pay

## 2011-09-29 ENCOUNTER — Encounter: Payer: Self-pay | Admitting: Radiation Oncology

## 2011-09-29 NOTE — Telephone Encounter (Signed)
Kelli [name] stated that she has had a better day today.  She could rest more easily without as much pain medication for her hip.   She had some chills and felt warm yesterday.  Temp was 100.1.   Temp now is 96.8.  She has no urinary symptoms, cough or congestion.  Surgical incision no redness, swelling, or purulent drainage noted. Insertion site of IV is bruised but not swollen or red.  Pt. Know to call if any of these symptoms occur and if her temp is 101.0 or higher.

## 2011-09-30 ENCOUNTER — Other Ambulatory Visit: Payer: Self-pay | Admitting: *Deleted

## 2011-09-30 DIAGNOSIS — M84553A Pathological fracture in neoplastic disease, unspecified femur, initial encounter for fracture: Secondary | ICD-10-CM

## 2011-09-30 MED ORDER — HYDROCODONE-ACETAMINOPHEN 5-325 MG PO TABS: 1.0000 | ORAL_TABLET | ORAL | Status: DC | PRN

## 2011-10-01 ENCOUNTER — Encounter: Payer: Self-pay | Admitting: *Deleted

## 2011-10-01 ENCOUNTER — Encounter: Payer: Self-pay | Admitting: Radiation Oncology

## 2011-10-01 ENCOUNTER — Ambulatory Visit
Admit: 2011-10-01 | Discharge: 2011-10-01 | Disposition: A | Discharge status: Home / Self Care | Payer: Medicare Other | Attending: Radiation Oncology | Admitting: Radiation Oncology

## 2011-10-01 VITALS — BP 114/65 | HR 94 | Temp 99.1°F

## 2011-10-01 DIAGNOSIS — T386X5A Adverse effect of antigonadotrophins, antiestrogens, antiandrogens, not elsewhere classified, initial encounter: Secondary | ICD-10-CM | POA: Insufficient documentation

## 2011-10-01 DIAGNOSIS — M810 Age-related osteoporosis without current pathological fracture: Secondary | ICD-10-CM | POA: Insufficient documentation

## 2011-10-01 DIAGNOSIS — Z923 Personal history of irradiation: Secondary | ICD-10-CM | POA: Insufficient documentation

## 2011-10-01 DIAGNOSIS — Z79899 Other long term (current) drug therapy: Secondary | ICD-10-CM | POA: Insufficient documentation

## 2011-10-01 DIAGNOSIS — C7951 Secondary malignant neoplasm of bone: Secondary | ICD-10-CM

## 2011-10-01 DIAGNOSIS — C50919 Malignant neoplasm of unspecified site of unspecified female breast: Secondary | ICD-10-CM | POA: Insufficient documentation

## 2011-10-01 DIAGNOSIS — M84453A Pathological fracture, unspecified femur, initial encounter for fracture: Secondary | ICD-10-CM

## 2011-10-01 DIAGNOSIS — C7952 Secondary malignant neoplasm of bone marrow: Secondary | ICD-10-CM | POA: Insufficient documentation

## 2011-10-01 DIAGNOSIS — Z510 Encounter for antineoplastic radiation therapy: Secondary | ICD-10-CM | POA: Insufficient documentation

## 2011-10-01 MED ORDER — OXYCODONE-ACETAMINOPHEN 5-325 MG PO TABS
1.0000 | ORAL_TABLET | Freq: Once | ORAL | Status: AC
  Administered 2011-10-01: 1 via ORAL
  Filled 2011-10-01: qty 1

## 2011-10-01 NOTE — Progress Notes (Signed)
Patient given oxycodone 5/325 1 po for pain score of "6" as forgot to take or bring pain medication to reconsultation appointment.Did get adequate relief down to score of 2.

## 2011-10-01 NOTE — Progress Notes (Signed)
Radiation Oncology         ([phone]) [phone] ________________________________  Initial outpatient Consultation  Name: Kelli [name] MRN: [id number]  Date: 10/01/2011  DOB: 11-Oct-1940  CC:No primary provider on file.  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 A diagnosis of Pathological fracture of femur was also pertinent to this visit.  HISTORY OF PRESENT ILLNESS::Kelli [name] is a 71 y.o. female who is seen out of the courtesy of Dr. Darrold Span for an opinion concerning radiation therapy as part of management of patient's metastatic breast cancer. The patient was diagnosed in 2002 the stage II left breast cancer. She did proceed radiation therapy to the left breast and regional nodal areas the direction of Dr. Dayton Scrape. Cumulative dose to the left breast area was 6440 cGy.  She did receive adjuvant hormonal therapy for approximately 10 years. Earlier this year the patient developed pain in the left knee. She is later date the progressive pain in the left upper leg and left pelvis area. An MRI of the pelvis region revealed a pathologic fracture of the left femoral neck. She was subsequently seen by Dr. Victorino Dike and underwent intramedullary nailing of the left proximal femur for pathologic fracture. A tissue obtained at the time of patient's surgery did show metastatic carcinoma.  She is now seen in radiation oncology for consideration for postoperative treatments.  PREVIOUS RADIATION THERAPY: Yes details as above.  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    PAST SURGICAL HISTORY: Past Surgical  History  Procedure Date  . Cholecystectomy 1970  . Mastectomy partial / lumpectomy w/ axillary lymphadenectomy JUNE 2002  . Femur fracture surgery 09/24/11    L femoral neck nailing s/p pathological fracture  . Femur im nail 09/24/2011    Procedure: INTRAMEDULLARY (IM) NAIL FEMORAL;  Surgeon: Toni Arthurs, MD;  Location: [facility];  Service: Orthopedics;  Laterality: Left;  . Bone biopsy 09/25/2011    left medullary canal femur reaming=Metastatic Carcinoma    FAMILY HISTORY: family history is not on file.  SOCIAL HISTORY:  reports that she has never smoked. She has never used smokeless tobacco. She reports that she does not drink alcohol or use illicit drugs.  ALLERGIES: Other and Sulfa antibiotics  MEDICATIONS:  Current Outpatient Prescriptions  Medication Sig Dispense Refill  . Calcium Carbonate-Vitamin D (CALTRATE 600+D) 600-400 MG-UNIT per tablet Take 1 tablet by mouth 2 (two) times daily.      . Cholecalciferol 5000 UNITS TABS Take 1 tablet by mouth daily.      Marland Kitchen enoxaparin (LOVENOX) 40 MG/0.4ML injection Inject 0.4 mLs (40 mg total) into the skin daily.  14 Syringe  0  . FLUoxetine (PROZAC) 20 MG capsule Take 1 capsule (20 mg total) by mouth daily.  90 capsule  PRN  . Glucosamine-Chondroitin (COSAMIN DS PO) Take 1 tablet by mouth daily.       Marland Kitchen HYDROcodone-acetaminophen (NORCO) 5-325 MG per tablet Take 1-2 tablets by mouth every 4 (four) hours as needed for pain.  30 tablet  0  . HYDROmorphone (DILAUDID) 2 MG tablet Take 1 tablet (2 mg total) by mouth every 4 (four) hours as needed for pain (prn pain not relieved with hydrocodone).  30 tablet  0  . levothyroxine (SYNTHROID, LEVOTHROID) 75 MCG tablet Take 75 mcg by mouth daily.      Marland Kitchen LORazepam (ATIVAN) 0.5 MG tablet Take 2 tablets (1 mg total) by mouth every 8 (eight) hours as needed for anxiety.  30 tablet  1  . senna-docusate (SENOKOT-S) 8.6-50 MG per tablet Take 2 tablets by mouth 2 (two) times daily. Pharmacy substitute for name brand  ok  60 tablet  2  . vitamin B-12 (CYANOCOBALAMIN) 1000 MCG tablet Take 1,000 mcg by mouth daily.      Marland Kitchen zolpidem (AMBIEN) 5 MG tablet Take 1 tablet (5 mg total) by mouth at bedtime as needed.  30 tablet  5  . traMADol (ULTRAM) 50 MG tablet        Current Facility-Administered Medications  Medication Dose Route Frequency Provider Last Rate Last Dose  . oxyCODONE-acetaminophen (PERCOCET) 5-325 MG per tablet 1 tablet  1 tablet Oral Once Billie Lade, MD   1 tablet at 10/01/11 1128    REVIEW OF SYSTEMS:  A 15 point review of systems is documented in the electronic medical record. This was obtained by the nursing staff. However, I reviewed this with the patient to discuss relevant findings and make appropriate changes.  Pertinent items are noted in HPI.  she continues to have pain in the left knee and left upper leg area since her surgery. She denies any other areas of pain. She denies any headaches dizziness or blurred vision. She has appropriate swelling in her left leg from surgery.   PHYSICAL EXAM:  temperature is 99.1 F (37.3 C). Her blood pressure is 114/65 and her pulse is 94.  in general is a very pleasant 71 year old female sitting in a wheelchair. Patient is accompanied by her husband on evaluation today. The pupils are equal round and reactive to light. Extraocular eye movements are intact. The tongue is midline. There is no secondary infection noted the oral cavity or posterior pharynx. Examination of the neck and supraclavicular region reveals no evidence of adenopathy. The axillary areas are free of adenopathy. Examination of lungs with some to be clear. The heart has a regular rhythm and rate. The abdomen is soft and nontender with normal bowel sounds. Neurological examination motor strength appears to be 5 out of 5 in the proximal and distal muscle groups in the upper lower extremities. Examination of left upper leg area reveals bandages in place from her recent surgery. Patient has mild  edema in the left leg area. There is no obvious palpable cords.   LABORATORY DATA:  Lab Results  Component Value Date   WBC 12.6* 09/26/2011   HGB 8.9* 09/26/2011   HCT 27.4* 09/26/2011   MCV 93.5 09/26/2011   PLT 169 09/26/2011   Lab Results  Component Value Date   NA 133* 09/26/2011   K 3.7 09/26/2011   CL 96 09/26/2011   CO2 26 09/26/2011   Lab Results  Component Value Date   ALT 15 09/26/2011   AST 45* 09/26/2011   ALKPHOS 203* 09/26/2011   BILITOT 0.4 09/26/2011     RADIOGRAPHY: Dg Chest 1 View  09/24/2011  *RADIOLOGY REPORT*  Clinical Data: Short of breath, history breast carcinoma  CHEST - 1 VIEW  Comparison: None.  Findings: The lungs are clear and slightly hyperaerated. Mediastinal contours appear normal.  The heart is within upper limits of normal.  Surgical clips overlie the left axilla and changes of left mastectomy are noted.  There may be  compression deformity of T7 and possibly T8 vertebral bodies.  IMPRESSION:  1.  No active lung disease. 2.  Cannot exclude compression deformities in the mid thoracic spine on the frontal view.  Original Report Authenticated By: Juline Patch, M.D.   Dg Sternum  09/24/2011  *RADIOLOGY REPORT*  Clinical Data: Sternal pain  STERNUM - 2+ VIEW  Comparison: None  Findings: The lateral view of the sternum is suboptimal with overlapping breast tissue.  No obvious lytic or blastic lesion is seen.  No retrosternal soft tissue swelling is noted.  IMPRESSION: Suboptimal lateral view of the sternum but no obvious bony abnormality is seen.  Original Report Authenticated By: Juline Patch, M.D.   Dg Femur Left  09/24/2011  *RADIOLOGY REPORT*  Clinical Data: Intraoperative images, ORIF left femur fracture.  LEFT FEMUR - 2 VIEW  Comparison: 09/24/2011 CT  Findings: Four fluoroscopic spot images obtained intraoperatively, demonstrating intramedullary rod and dynamic hip screw fixation of the patient's pathologic femur fracture. Hardware is grossly intact and in expected  alignment.  IMPRESSION: ORIF left femur.  Original Report Authenticated By: Waneta Martins, M.D.   Dg Femur Right  09/24/2011  *RADIOLOGY REPORT*  Clinical Data: Pain on standing  RIGHT FEMUR - 2 VIEW  Comparison: None.  Findings: The right femur is intact.  There is mild degenerative joint disease of the right hip.  No acute abnormality is seen.  IMPRESSION: Negative right femur other than degenerative joint disease of the right hip.  Original Report Authenticated By: Juline Patch, M.D.   Dg Tibia/fibula Left  09/24/2011  *RADIOLOGY REPORT*  Clinical Data: Pain on standing  LEFT TIBIA AND FIBULA - 2 VIEW  Comparison: None.  Findings: There is tricompartmental degenerative joint disease of the right knee primarily involving the medial and patellofemoral compartments.  Faint chondrocalcinosis of the right knee also is noted which may be seen with osteoarthritis or CPPD.  No acute fracture is seen.  IMPRESSION:  1.  No fracture. 2.  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  Original Report Authenticated By: Juline Patch, M.D.   Dg Tibia/fibula Right  09/24/2011  *RADIOLOGY REPORT*  Clinical Data: Pain on standing  RIGHT TIBIA AND FIBULA - 2 VIEW  Comparison: None.  Findings: There is mild tricompartmental degenerative joint disease of the right knee.  Faint chondrocalcinosis is seen.  No joint effusion is noted.  The right tibia and fibula are intact.  IMPRESSION:  1.  Negative right tibia and fibula. 2.  Tricompartmental degenerative joint disease of the right knee with chondrocalcinosis as well.  Original Report Authenticated By: Juline Patch, M.D.   Ct Chest W Contrast  09/24/2011  *RADIOLOGY REPORT*  Clinical Data:  Metastatic cancer to bone.  History of breast cancer.  Sternal pain.  Shortness of breath.  Staging.  CT CHEST, ABDOMEN AND PELVIS WITH CONTRAST  Technique:  Multidetector CT imaging of the chest, abdomen and pelvis was performed following the standard  protocol during bolus administration of intravenous contrast.  Contrast: OMNIPAQUE IOHEXOL 300 MG/ML  SOLN  Comparison:   None.  CT CHEST  Findings:  Heart is normal size. Aorta is normal caliber.  Density is noted in the left apex, likely scarring.  Areas of scattered scarring in the lingula and lung bases. Anterior left lung scarring may be related to prior chest wall radiation.  No pulmonary nodules or pleural effusions. Heart is normal size. Aorta is normal caliber. No mediastinal,  hilar, or axillary adenopathy.  Surgical changes in the left breast.  Surgical clips in the left axilla.  Chest wall soft tissues otherwise unremarkable.  Mixed lytic and sclerotic lesions noted in the mid thoracic spine at T5 through T8.  Slight compression of the T8 vertebral body.  No definite sternal abnormality.  Several subtle lesions are seen within the ribs bilaterally, either lucent or mottled appearance compatible with metastases.  Probable pathologic fracture within the posterior right ninth rib.  Healing left 10th rib fracture.  IMPRESSION: Areas of scarring in the lungs bilaterally, most pronounced in the left apex.  Scarring anteriorly in the lingula.  Areas of scarring in the left lung could be related to prior radiation to the left breast and chest wall.  Postoperative changes in the left breast.  Numerous bone metastases throughout the thoracic spine and ribs. Pathologic fractures within the ribs bilaterally.  Slight compression through the superior aspect of T8.  CT ABDOMEN AND PELVIS  Findings:  Large solid mass centrally within the liver measuring 4.4 cm compatible with metastasis.  Other smaller metastases within both the left and right hepatic lobes (at least three others). Hypodense lesion within the spleen could also reflect metastasis. This measures 1.5 cm.  Pancreas, adrenals, right kidney are unremarkable.  There is severe left hydronephrosis.  No renal or ureteral stones. The left ureter is  decompressed.  There is suggestion of abnormal soft tissue in the left retroperitoneum at the proximal left ureter, possible soft tissue metastasis.  This is along the anterior left psoas muscle.  Borderline sized retroperitoneal lymph nodes and retrocrural lymph nodes.  Mildly prominent central mesenteric lymph nodes as well. Index left periaortic node on image 71 has a short axis diameter of 7 mm.  Index node on image 66 has a short axis diameter of 7 mm.  Uterus and adnexa are unremarkable.  There is a small amount of free fluid in the pelvis.  Large and small bowel grossly unremarkable.  Bone metastases within the lumbar spine, best seen at L1 with mixed lytic and sclerotic appearance.  Probable metastases within the left iliac crest and proximal left femoral shaft.  IMPRESSION: Numerous hepatic metastases.  Probable metastasis within the spleen.  Marked left hydronephrosis.  There appears to be subtle abnormal soft tissue adjacent to the proximal left ureter just anterior to the left psoas muscle, presumable retroperitoneal metastasis.  Borderline retrocrural, mesenteric and retroperitoneal lymph nodes.  Numerous bony metastases.  Small amount of free fluid in the pelvis.  Original Report Authenticated By: Cyndie Chime, M.D.   Mr Hip Left W Wo Contrast  09/20/2011  **ADDENDUM** CREATED: 09/20/2011 12:39:26  "BUN and creatinine were obtained on site at [facility] at [address]  Test results for your reference are:    Creatinine: 1.4    Bun:        23  **END ADDENDUM** SIGNED BY: Soyla Murphy. Ova Freshwater, M.D.   09/20/2011  *RADIOLOGY REPORT*  Clinical Data: Left hip and leg pain.  Unable to bear weight.  MRI OF THE LEFT HIP WITHOUT AND WITH CONTRAST  Technique:  Multiplanar, multisequence MR imaging was performed both before and after administration of intravenous contrast.  Contrast: 6mL MULTIHANCE GADOBENATE DIMEGLUMINE 529 MG/ML IV SOLN  Comparison: 08/29/2004  Findings: Innumerable  enhancing lesions of varying sizes are noted throughout the visualized bony pelvis, hips, and lumbar spine.  The lumbar spine lesions are new compared the prior MRI from 03/20/2010.  The appearance reflects  diffuse osseous metastatic disease.  There is a diffuse infiltrative marrow lesion filling the entire medullary space of the left femoral neck and extending down approximately 14 cm in the left proximal femur, with an associated pathologic fracture of the left femoral neck.  Surrounding marrow edema and adjacent soft tissue edema noted.  The fracture is primarily basicervical although there may be some extension to the greater trochanter.  Some of the metastatic lesions to the bony pelvis are moderately expansile, for example in the left inferior pubic ramus on image 70 of series 3.  A 1.3 cm fibroid is present in the uterus. Trace free pelvic fluid is present.  IMPRESSION:  1.  Diffuse osseous metastatic disease, with innumerable lesions in the pelvis, hips, and lower lumbar spine.  A large lesion infiltrating the left proximal femur is associated with a pathologic fracture of the left femoral neck which may have mild involvement of the greater trochanter. 2.  Trace free pelvic fluid, etiology uncertain.  Original Report Authenticated By: Dellia Cloud, M.D.   Ct Abdomen Pelvis W Contrast  09/24/2011  *RADIOLOGY REPORT*  Clinical Data:  Metastatic cancer to bone.  History of breast cancer.  Sternal pain.  Shortness of breath.  Staging.  CT CHEST, ABDOMEN AND PELVIS WITH CONTRAST  Technique:  Multidetector CT imaging of the chest, abdomen and pelvis was performed following the standard protocol during bolus administration of intravenous contrast.  Contrast: OMNIPAQUE IOHEXOL 300 MG/ML  SOLN  Comparison:   None.  CT CHEST  Findings:  Heart is normal size. Aorta is normal caliber.  Density is noted in the left apex, likely scarring.  Areas of scattered scarring in the lingula and lung bases. Anterior  left lung scarring may be related to prior chest wall radiation.  No pulmonary nodules or pleural effusions. Heart is normal size. Aorta is normal caliber. No mediastinal, hilar, or axillary adenopathy.  Surgical changes in the left breast.  Surgical clips in the left axilla.  Chest wall soft tissues otherwise unremarkable.  Mixed lytic and sclerotic lesions noted in the mid thoracic spine at T5 through T8.  Slight compression of the T8 vertebral body.  No definite sternal abnormality.  Several subtle lesions are seen within the ribs bilaterally, either lucent or mottled appearance compatible with metastases.  Probable pathologic fracture within the posterior right ninth rib.  Healing left 10th rib fracture.  IMPRESSION: Areas of scarring in the lungs bilaterally, most pronounced in the left apex.  Scarring anteriorly in the lingula.  Areas of scarring in the left lung could be related to prior radiation to the left breast and chest wall.  Postoperative changes in the left breast.  Numerous bone metastases throughout the thoracic spine and ribs. Pathologic fractures within the ribs bilaterally.  Slight compression through the superior aspect of T8.  CT ABDOMEN AND PELVIS  Findings:  Large solid mass centrally within the liver measuring 4.4 cm compatible with metastasis.  Other smaller metastases within both the left and right hepatic lobes (at least three others). Hypodense lesion within the spleen could also reflect metastasis. This measures 1.5 cm.  Pancreas, adrenals, right kidney are unremarkable.  There is severe left hydronephrosis.  No renal or ureteral stones. The left ureter is decompressed.  There is suggestion of abnormal soft tissue in the left retroperitoneum at the proximal left ureter, possible soft tissue metastasis.  This is along the anterior left psoas muscle.  Borderline sized retroperitoneal lymph nodes and retrocrural lymph nodes.  Mildly prominent central mesenteric lymph nodes as well. Index  left periaortic node on image 71 has a short axis diameter of 7 mm.  Index node on image 66 has a short axis diameter of 7 mm.  Uterus and adnexa are unremarkable.  There is a small amount of free fluid in the pelvis.  Large and small bowel grossly unremarkable.  Bone metastases within the lumbar spine, best seen at L1 with mixed lytic and sclerotic appearance.  Probable metastases within the left iliac crest and proximal left femoral shaft.  IMPRESSION: Numerous hepatic metastases.  Probable metastasis within the spleen.  Marked left hydronephrosis.  There appears to be subtle abnormal soft tissue adjacent to the proximal left ureter just anterior to the left psoas muscle, presumable retroperitoneal metastasis.  Borderline retrocrural, mesenteric and retroperitoneal lymph nodes.  Numerous bony metastases.  Small amount of free fluid in the pelvis.  Original Report Authenticated By: Cyndie Chime, M.D.   Dg Humerus Left  09/24/2011  *RADIOLOGY REPORT*  Clinical Data: Pain on standing  LEFT HUMERUS - 2+ VIEW  Comparison: None.  Findings: The left humerus is intact.  No lytic or blastic lesion is seen.  The left glenohumeral joint space appears normal at the left [facility] joint is normally aligned.  There is cortical irregularity of the posterior left sixth rib which may be due to healing fracture although a metastatic lesion cannot be excluded.  IMPRESSION:  1.  Negative left humerus. 2.  Healing fracture or pathological lesion involving the left posterior sixth rib.  Original Report Authenticated By: Juline Patch, M.D.   Dg Humerus Right  09/24/2011  *RADIOLOGY REPORT*  Clinical Data: Pain on standing  RIGHT HUMERUS - 2+ VIEW  Comparison: None.  Findings: The right humerus appears intact.  No lytic or blastic lesion is seen.  The right [facility] joint is normally aligned.  The right ribs are visualized appear normal.  IMPRESSION: Negative right humerus.  Original Report Authenticated By: Juline Patch, M.D.   Dg C-arm  1-60 Min-no Report  09/24/2011  CLINICAL DATA: intra op   C-ARM 1-60 MINUTES  Fluoroscopy was utilized by the requesting physician.  No radiographic  interpretation.        IMPRESSION: Recurrent, metastatic breast cancer. images show diffuse osseous metastasis as well as liver metastasis. Patient would be a good candidate for postoperative radiation therapy directed at the left femur area. I discussed the overall treatment course side effects and potential toxicities of radiation therapy in this situation with the patient and her husband. Patient appears to understand and wishes to proceed with planned course of treatment.  PLAN: Simulation on July 22. This should allow adequate time for healing from her recent surgery. Anticipate approximately 10 treatments directed at the left femur region.  I spent 60 minutes minutes face to face with the patient and more than 50% of that time was spent in counseling and/or coordination of care.   ------------------------------------------------  -----------------------------------  Billie Lade, PhD, MD

## 2011-10-01 NOTE — Progress Notes (Signed)
Please see the Nurse Progress Note in the MD Initial Consult Encounter for this patient. 

## 2011-10-01 NOTE — Progress Notes (Signed)
Here for reconsultation of metastatic breast cancer to left femur.Patient had been doing well until June 2013 started having left hip apin.Was being followed by orthopaedic doctor.Took anti-inflammatory without relief then had mr of left hip on 09/19/11 which revealed diffuse osseous mets.Pateint still takes levonox injections.

## 2011-10-06 ENCOUNTER — Other Ambulatory Visit: Payer: Medicare Other | Admitting: Lab

## 2011-10-06 ENCOUNTER — Ambulatory Visit: Payer: Medicare Other | Admitting: Oncology

## 2011-10-06 ENCOUNTER — Telehealth: Payer: Self-pay | Admitting: Oncology

## 2011-10-06 ENCOUNTER — Ambulatory Visit (HOSPITAL_BASED_OUTPATIENT_CLINIC_OR_DEPARTMENT_OTHER): Payer: Medicare Other | Admitting: Lab

## 2011-10-06 ENCOUNTER — Ambulatory Visit: Payer: Medicare Other

## 2011-10-06 ENCOUNTER — Encounter: Payer: Self-pay | Admitting: *Deleted

## 2011-10-06 ENCOUNTER — Encounter: Payer: Self-pay | Admitting: Oncology

## 2011-10-06 ENCOUNTER — Ambulatory Visit (HOSPITAL_BASED_OUTPATIENT_CLINIC_OR_DEPARTMENT_OTHER): Payer: Medicare Other | Admitting: Oncology

## 2011-10-06 ENCOUNTER — Other Ambulatory Visit: Payer: Self-pay

## 2011-10-06 VITALS — BP 111/66 | HR 95 | Temp 98.8°F | Ht 64.0 in | Wt 140.0 lb

## 2011-10-06 DIAGNOSIS — R300 Dysuria: Secondary | ICD-10-CM

## 2011-10-06 DIAGNOSIS — C7951 Secondary malignant neoplasm of bone: Secondary | ICD-10-CM

## 2011-10-06 DIAGNOSIS — C801 Malignant (primary) neoplasm, unspecified: Secondary | ICD-10-CM

## 2011-10-06 DIAGNOSIS — C50919 Malignant neoplasm of unspecified site of unspecified female breast: Secondary | ICD-10-CM

## 2011-10-06 LAB — CBC WITH DIFFERENTIAL/PLATELET
BASO%: 0.5 % (ref 0.0–2.0)
LYMPH%: 26.9 % (ref 14.0–49.7)
MCH: 29.6 pg (ref 25.1–34.0)
MCHC: 32.2 g/dL (ref 31.5–36.0)
MCV: 92.2 fL (ref 79.5–101.0)
MONO%: 6.5 % (ref 0.0–14.0)
Platelets: 429 10*3/uL — ABNORMAL HIGH (ref 145–400)
RBC: 2.77 10*6/uL — ABNORMAL LOW (ref 3.70–5.45)

## 2011-10-06 LAB — COMPREHENSIVE METABOLIC PANEL
ALT: 9 U/L (ref 0–35)
Alkaline Phosphatase: 229 U/L — ABNORMAL HIGH (ref 39–117)
Creatinine, Ser: 0.74 mg/dL (ref 0.50–1.10)
Sodium: 138 mEq/L (ref 135–145)
Total Bilirubin: 0.3 mg/dL (ref 0.3–1.2)
Total Protein: 6.9 g/dL (ref 6.0–8.3)

## 2011-10-06 LAB — URINALYSIS, MICROSCOPIC - CHCC
Glucose: NEGATIVE g/dL
Nitrite: NEGATIVE
Specific Gravity, Urine: 1.02 (ref 1.003–1.035)

## 2011-10-06 LAB — IRON AND TIBC
%SAT: 12 % — ABNORMAL LOW (ref 20–55)
UIBC: 269 ug/dL (ref 125–400)

## 2011-10-06 LAB — VITAMIN B12: Vitamin B-12: >2000 pg/mL — ABNORMAL HIGH (ref 211–911)

## 2011-10-06 MED ORDER — PANTOPRAZOLE SODIUM 40 MG PO TBEC: 40.0000 mg | DELAYED_RELEASE_TABLET | Freq: Every day | ORAL | Status: DC

## 2011-10-06 NOTE — Patient Instructions (Addendum)
Hydromorphone (dilaudid) 2mg :  Take one when you get home now and one at bedtime tonight. You can repeat this or take 1-2 hydrocodone if you wake up tonight and are uncomfortable.  Beginning tomorrow AM, take hydrocodone 1 or 2 tablets every 6 hrs.  Call Dr Darrold Span tomorrow afternoon to let me know how you are    [phone]

## 2011-10-06 NOTE — Progress Notes (Signed)
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 Presentation with metastatic disease was with pathologic fracture of left femoral neck, that nailed by Dr Victorino Dike on 09-24-11.  Oncologic history is of T1N1 invasive ductal eft breast cancer diagnosed June 2002 with 13 nodes involved including extracapsular extension, ER + 67%, PR + 39%, Her 2 0. She was treated with lumpectomy and 18 node axillary node dissection, adjuvant adriamycin/cytoxan followed by CMF chemotherapy, local radiation, tamoxifen from April 2003 thru Dec 2003 then arimidex from Dec 2004 thru June 2011. The arimidex had been continued for that long duration due to high risk features, stopped as there had been no evidence of disease and with known osteoporosis. She was clinically stable and labs were normal other than very slight decrease in hemoglobin when I saw her in Jan 2013.  She developed acute pain in left hip in mid June which did not respond to pain medication by rheumatologist, then MRI hip 09-20-11 showed metastatic disease with pathologic fracture of femoral neck. She was admitted 7-3 thru 09-26-11, with nailing by Dr Victorino Dike on 09-24-11. Pathology from the surgery confirmed metastatic breast carcinoma, ER+ 100%, PR negative and HER 2 negative by [facility]. She had first pamidronate in hospital 09-25-11.   Patient has had a difficult time since discharge home, with pain, difficulty sleeping and poor appetite with some GERD. She has not used pain medication (has dilaudid and hydrocodone) tho she is hurting in left hip and low back continuously, and is unable to sleep due to pain. She is drinking water and is able to eat a little fruit and yogurt only. She has had some diarrhea and we will send stool for C diff; she has some mild urinary symptoms and we will also check UA  C&S. She has not had fever. She has not wanted home PT, tho husband and I both feel they would be helpful and have encouraged her to allow this as set up at DC. She is still on daily LMW heparin.She has not seen bleeding. She has understandably been anxious and upset.  Remainder of 10 point Review of Systems negative.  Objective:  Vital signs in last 24 hours:  BP 111/66  Pulse 95  Temp 98.8 F (37.1 C) (Oral)  Ht 5\' 4"  (1.626 m)  Wt 140 lb (63.504 kg)  BMI 24.03 kg/m2 In wheelchair, looks tired and in moderate pain, seems to have difficulty comprehending discussion with anxiety. Husband is very supportive and follows conversation well.   HEENT:PERRLA, sclera clear, anicteric and oropharynx clear, no lesions LymphaticsCervical, supraclavicular, and axillary nodes normal. Resp: clear to auscultation bilaterally Cardio: regular rate and rhythm GI: soft, non-tender; bowel sounds normal; no masses,  no organomegaly Extremities: no pitting edema, cords, tenderness. Surgical incisions with dry dressings, no surrounding erythema, swelling, tenderness Neuro:nonfocal on exam just in [facility] Breasts no dominant masses or skin/nipple findings bilaterally. No axillary adenopathy No central catheter. Note cannot use LUE for IV access with prior breast cancer surgery and RT. Lab Results:  Results for orders placed in visit on 10/06/11  CBC WITH DIFFERENTIAL      Component Value Range   WBC 12.4 (*) 3.9 - 10.3 10e3/uL   NEUT# 8.0 (*) 1.5 - 6.5 10e3/uL   HGB 8.2 (*) 11.6 - 15.9 g/dL   HCT 08.6 (*) 57.8 - 46.9 %  Platelets 429 (*) 145 - 400 10e3/uL   MCV 92.2  79.5 - 101.0 fL   MCH 29.6  25.1 - 34.0 pg   MCHC 32.2  31.5 - 36.0 g/dL   RBC 1.32 (*) 4.40 - 1.02 10e6/uL   RDW 16.9 (*) 11.2 - 14.5 %   lymph# 3.3  0.9 - 3.3 10e3/uL   MONO# 0.8  0.1 - 0.9 10e3/uL   Eosinophils Absolute 0.2  0.0 - 0.5 10e3/uL   Basophils Absolute 0.1  0.0 - 0.1 10e3/uL   NEUT% 64.6  38.4 - 76.8 %   LYMPH% 26.9   14.0 - 49.7 %   MONO% 6.5  0.0 - 14.0 %   EOS% 1.5  0.0 - 7.0 %   BASO% 0.5  0.0 - 2.0 %  COMPREHENSIVE METABOLIC PANEL      Component Value Range   Sodium 138  135 - 145 mEq/L   Potassium 3.6  3.5 - 5.3 mEq/L   Chloride 100  96 - 112 mEq/L   CO2 26  19 - 32 mEq/L   Glucose, Bld 96  70 - 99 mg/dL   BUN 14  6 - 23 mg/dL   Creatinine, Ser 7.25  0.50 - 1.10 mg/dL   Total Bilirubin 0.3  0.3 - 1.2 mg/dL   Alkaline Phosphatase 229 (*) 39 - 117 U/L   AST 28  0 - 37 U/L   ALT 9  0 - 35 U/L   Total Protein 6.9  6.0 - 8.3 g/dL   Albumin 2.8 (*) 3.5 - 5.2 g/dL   Calcium 9.3  8.4 - 36.6 mg/dL  IRON AND TIBC      Component Value Range   Iron 35 (*) 42 - 145 ug/dL   UIBC 440  347 - 425 ug/dL   TIBC 956  387 - 564 ug/dL   %SAT 12 (*) 20 - 55 %  VITAMIN B12      Component Value Range   Vitamin B-12 >2000 (*) 211 - 911 pg/mL  URINALYSIS, MICROSCOPIC - [facility]      Component Value Range   Glucose Negative  Negative g/dL   Bilirubin (Urine) Negative  Negative   Ketones Negative  Negative mg/dL   Specific Gravity, Urine 1.020  1.003 - 1.035   Blood Negative  Negative   pH 6.0  4.6 - 8.0   Protein < 30  Negative- <30 mg/dL   Nitrite Negative  Negative   Leukocyte Esterase Small  Negative   RBC count 0-2  0 - 2   WBC, UA 3-6  0 - 2   Bacteria, UA Moderate  Negative- Trace   Casts Hyaline  Negative   Epithelial Cells Few  Negative- Few   Mucus, UA Moderate  Negative- Small   urine culture and stool for C diff pending Hemoccult cards given  Studies/Results:  PET scheduled.  Medications: I have reviewed the patient's current medications.  I have told her to complete lovenox as directed. Will begin protonix. I have given them written and oral instructions for her to take dilaudid when she gets home today and repeat this at hs tonight. Beginning in am, she is to take hydrocodone every 6 hrs. She is to let us know tomorrow how she has done with this.  We have discussed possible systemic  treatment on Bolero 4 trial with everolimus + letrozole. Research RN has met patient and given them some initial information concerning that study. I have discussed with my partner  in breast oncology, who feels this study would be very reasonable for her situation.Study cannot begin until 2 weeks after RT completes.  Assessment/Plan: 1.New diagnosis of metastatic breast carcinoma, presenting with pathologic left femur fracture 11 years since original diagnosis of multiple node positive/ hormone receptor + breast cancer. Further staging as above, possible treatment on study with everolimus + letrozole after RT. Post nailing of left femur 09-24-11. 2. Anemia: progressive in past 6 months. Etiology not clear, tho may be related to bone involvement especially if extensive, + recent surgery. Studies pending today. 3.diarrhea and urinary symptoms:as above 4.GERD symptoms: add protonix 5.anxiety and pain: as above.  Patient and husband seemed more relaxed after discussion and in agreement with plan. I will see her in follow up later this week and we will be in touch by phone before that visit.   Reece Packer, MD   10/06/2011, 9:58 PM

## 2011-10-06 NOTE — Telephone Encounter (Signed)
appts made and printed for pt  °

## 2011-10-07 ENCOUNTER — Encounter (HOSPITAL_COMMUNITY)
Admission: RE | Admit: 2011-10-07 | Discharge: 2011-10-07 | Disposition: A | Discharge status: Home / Self Care | Payer: Medicare Other | Source: Ambulatory Visit | Attending: Oncology | Admitting: Oncology

## 2011-10-07 ENCOUNTER — Telehealth: Payer: Self-pay

## 2011-10-07 ENCOUNTER — Encounter: Payer: Self-pay | Admitting: *Deleted

## 2011-10-07 DIAGNOSIS — C7951 Secondary malignant neoplasm of bone: Secondary | ICD-10-CM | POA: Insufficient documentation

## 2011-10-07 DIAGNOSIS — D649 Anemia, unspecified: Secondary | ICD-10-CM | POA: Insufficient documentation

## 2011-10-07 DIAGNOSIS — C50919 Malignant neoplasm of unspecified site of unspecified female breast: Secondary | ICD-10-CM | POA: Insufficient documentation

## 2011-10-07 NOTE — Telephone Encounter (Signed)
Spoke with Ms. Vice and she stated that she is much more comfortable with using the dilaudid yesterday as Dr. Darrold Span instructed and today with the Hydrocodone.  Her husband stated that she slept very well last night. Told patient and husband that Dr. Darrold Span would like to give her some IV iron and 1 unit of PRBC on Friday 10-10-11.  This will Help build her blood back up and not feel as tired.  The iron tests came back low from yesterday. Ms. Boss agreed to the iron and transfusion.  She will come in Friday for T&c at 1130 and then be infusion at 1230.  She will see Dr. Darrold Span around previously scheduled time.

## 2011-10-08 ENCOUNTER — Other Ambulatory Visit: Payer: Self-pay

## 2011-10-08 ENCOUNTER — Telehealth: Payer: Self-pay

## 2011-10-08 DIAGNOSIS — C50919 Malignant neoplasm of unspecified site of unspecified female breast: Secondary | ICD-10-CM

## 2011-10-08 NOTE — Telephone Encounter (Signed)
Told Mr, Cassatt that Mrs. Montville does not have a bladder infection. Ms. Hoppe's pain is being well managed now with regular pain med.  Her bowels are moving well.  Her appetite has increased a little from the other day. Rescheduled lab for T&Hold for tomorrow after Pet scan per pt. Request.

## 2011-10-09 ENCOUNTER — Encounter (HOSPITAL_COMMUNITY): Payer: Self-pay

## 2011-10-09 ENCOUNTER — Other Ambulatory Visit (HOSPITAL_BASED_OUTPATIENT_CLINIC_OR_DEPARTMENT_OTHER): Payer: Medicare Other | Admitting: Lab

## 2011-10-09 ENCOUNTER — Encounter (HOSPITAL_COMMUNITY)
Admission: RE | Admit: 2011-10-09 | Discharge: 2011-10-09 | Disposition: A | Discharge status: Home / Self Care | Payer: Medicare Other | Source: Ambulatory Visit | Attending: Oncology | Admitting: Oncology

## 2011-10-09 DIAGNOSIS — C801 Malignant (primary) neoplasm, unspecified: Secondary | ICD-10-CM | POA: Insufficient documentation

## 2011-10-09 DIAGNOSIS — C50919 Malignant neoplasm of unspecified site of unspecified female breast: Secondary | ICD-10-CM

## 2011-10-09 DIAGNOSIS — C7951 Secondary malignant neoplasm of bone: Secondary | ICD-10-CM

## 2011-10-09 LAB — CBC WITH DIFFERENTIAL/PLATELET
Basophils Absolute: 0.1 10*3/uL (ref 0.0–0.1)
Eosinophils Absolute: 0.2 10*3/uL (ref 0.0–0.5)
HCT: 26.2 % — ABNORMAL LOW (ref 34.8–46.6)
HGB: 8.3 g/dL — ABNORMAL LOW (ref 11.6–15.9)
LYMPH%: 27.5 % (ref 14.0–49.7)
MCHC: 31.7 g/dL (ref 31.5–36.0)
MONO#: 0.8 10*3/uL (ref 0.1–0.9)
NEUT#: 8.4 10*3/uL — ABNORMAL HIGH (ref 1.5–6.5)
NEUT%: 64.5 % (ref 38.4–76.8)
Platelets: 431 10*3/uL — ABNORMAL HIGH (ref 145–400)
WBC: 13.1 10*3/uL — ABNORMAL HIGH (ref 3.9–10.3)
lymph#: 3.6 10*3/uL — ABNORMAL HIGH (ref 0.9–3.3)

## 2011-10-09 LAB — GLUCOSE, CAPILLARY: Glucose-Capillary: 117 mg/dL — ABNORMAL HIGH (ref 70–99)

## 2011-10-09 MED ORDER — FLUDEOXYGLUCOSE F - 18 (FDG) INJECTION
15.4000 | Freq: Once | INTRAVENOUS | Status: AC | PRN
  Administered 2011-10-09: 15.4 via INTRAVENOUS

## 2011-10-09 NOTE — Addendum Note (Signed)
Encounter addended by: Amanda Pea, RN on: 10/09/2011  5:42 PM<BR>     Documentation filed: Charges VN

## 2011-10-10 ENCOUNTER — Ambulatory Visit (HOSPITAL_BASED_OUTPATIENT_CLINIC_OR_DEPARTMENT_OTHER): Payer: Medicare Other

## 2011-10-10 ENCOUNTER — Other Ambulatory Visit: Payer: Self-pay | Admitting: *Deleted

## 2011-10-10 ENCOUNTER — Other Ambulatory Visit: Payer: Self-pay | Admitting: Certified Registered Nurse Anesthetist

## 2011-10-10 ENCOUNTER — Other Ambulatory Visit (HOSPITAL_BASED_OUTPATIENT_CLINIC_OR_DEPARTMENT_OTHER): Payer: Medicare Other | Admitting: Oncology

## 2011-10-10 ENCOUNTER — Encounter: Payer: Self-pay | Admitting: Oncology

## 2011-10-10 ENCOUNTER — Telehealth: Payer: Self-pay | Admitting: Oncology

## 2011-10-10 ENCOUNTER — Other Ambulatory Visit: Payer: Medicare Other | Admitting: Lab

## 2011-10-10 ENCOUNTER — Ambulatory Visit (HOSPITAL_BASED_OUTPATIENT_CLINIC_OR_DEPARTMENT_OTHER): Payer: Medicare Other | Admitting: Oncology

## 2011-10-10 ENCOUNTER — Ambulatory Visit: Payer: Medicare Other | Admitting: Oncology

## 2011-10-10 VITALS — BP 119/74 | HR 91 | Temp 98.4°F | Resp 18

## 2011-10-10 VITALS — BP 109/66 | HR 98 | Temp 97.7°F | Resp 20

## 2011-10-10 DIAGNOSIS — C787 Secondary malignant neoplasm of liver and intrahepatic bile duct: Secondary | ICD-10-CM

## 2011-10-10 DIAGNOSIS — C50919 Malignant neoplasm of unspecified site of unspecified female breast: Secondary | ICD-10-CM

## 2011-10-10 DIAGNOSIS — D649 Anemia, unspecified: Secondary | ICD-10-CM

## 2011-10-10 DIAGNOSIS — C7951 Secondary malignant neoplasm of bone: Secondary | ICD-10-CM

## 2011-10-10 DIAGNOSIS — M810 Age-related osteoporosis without current pathological fracture: Secondary | ICD-10-CM

## 2011-10-10 DIAGNOSIS — C801 Malignant (primary) neoplasm, unspecified: Secondary | ICD-10-CM

## 2011-10-10 DIAGNOSIS — M84553A Pathological fracture in neoplastic disease, unspecified femur, initial encounter for fracture: Secondary | ICD-10-CM

## 2011-10-10 MED ORDER — SODIUM CHLORIDE 0.9 % IV SOLN
1020.0000 mg | Freq: Once | INTRAVENOUS | Status: AC
  Administered 2011-10-10: 1020 mg via INTRAVENOUS
  Filled 2011-10-10: qty 34

## 2011-10-10 MED ORDER — HYDROCODONE-ACETAMINOPHEN 5-325 MG PO TABS: 1.0000 | ORAL_TABLET | Freq: Four times a day (QID) | ORAL | Status: DC | PRN

## 2011-10-10 MED ORDER — ACETAMINOPHEN 325 MG PO TABS
650.0000 mg | ORAL_TABLET | Freq: Once | ORAL | Status: AC
  Administered 2011-10-10: 650 mg via ORAL

## 2011-10-10 NOTE — Progress Notes (Signed)
OFFICE PROGRESS NOTE   10/10/2011   Physicians:J.Hewitt, J.Kinard, J.Beekman, W.Elkins, M.Altheimer   INTERVAL HISTORY:  Patient is seen, together with husband, in follow up of her recently diagnosed metastatic breast cancer involving bone and liver; she is also receiving IV feraheme and 1 unit of PRBCs today. She has seen Dr Victorino Dike, doing well from left femoral nailing. She is for simulation by Dr Roselind Messier 10-13-11, ~ 10 treatments planned. She had PET on 10-09-11. She has been much more comfortable with low doses of pain medication since I saw her last week. Appetite has improved and she is able to sleep. Stool was negative for C diff and urine culture no growth last week.  Oncologic history is of T1N1 invasive ductal eft breast cancer diagnosed June 2002 with 13 nodes involved including extracapsular extension, ER + 67%, PR + 39%, Her 2  0. She was treated with lumpectomy and 18 node axillary node dissection, adjuvant adriamycin/cytoxan followed by CMF chemotherapy, local radiation, tamoxifen from April 2003 thru Dec 2003 then arimidex from Dec 2004 thru June 2011. The arimidex had been continued for that long duration due to high risk features, stopped as there had been no evidence of disease and with known osteoporosis. She was clinically stable and labs were normal other than very slight decrease in hemoglobin when I saw her in Jan 2013.  She developed acute pain in left hip in mid June which did not respond to pain medication by rheumatologist, then MRI hip 09-20-11 showed metastatic disease with pathologic fracture of femoral neck. She was admitted 7-3 thru 09-26-11, with nailing by Dr Victorino Dike on 09-24-11. Pathology from the surgery confirmed metastatic breast carcinoma,  ER+ 100%, PR negative and HER 2 negative by [facility].  She had first pamidronate in hospital. She has bone involvement by imaging including likely sclerotic met C1 vertebral body, 4 areas in liver, left femur, anterior left iliac wing, right  inferior trochanter and other areas pelvis and spine. She has soft tissue nodule at left ureter with SUV 4 and left parapharyngeal space mass with SUV ~ 5.  She may be eligible for Bolero 4 trial with everolimus + letrozole, tho she cannot begin this until at least 2 weeks after RT completes. I have discussed again with Research RN today and have LM for Dr Roselind Messier re timing of RT.  Patient took total 2 dilaudid as instructed after visit last week, with good relief of pain such that she could sleep. She has used only hydrocodone since then, usually once in afternoon and once at bedtime. We have discussed using this more regularly if needed. Pain is in left hip and low back. She denies nausea or other GI symptoms. She denies shortness of breath now. She has had no fever and no swelling LE. She went up and down 13 steps in home x2 yesterday. Note home PT did not start and approval for that has expired; she does not feel she needs PT now.   Remainder of 10 point Review of Systems negative.  Objective:  Vital signs in last 24 hours:  BP 109/66  Pulse 98  Temp 97.7 F (36.5 C)  Resp 20 Not labored RA. Last weight was 140 lb on 10-06-11.   HEENT:PERRLA, extra ocular movement intact, sclera clear, anicteric and oropharynx clear, no lesions LymphaticsCervical, supraclavicular, and axillary nodes normal.Posterior pharynx symmetriclal. Neck supple without JVD. Resp: clear to auscultation bilaterally Cardio: regular rate and rhythm GI: soft, non-tender; bowel sounds normal; no masses,  no organomegaly  Extremities: no swelling bilat LE. Staples out of LLE. No swelling UE bilaterally Neuro:alert, appropriate, oriented, no focal deficits Peripheral IV RUE site ok, tho access is somewhat difficult and limited to RUE. Skin without bruises or rash.  Lab Results:  Results for orders placed in visit on 10/10/11  FECAL OCCULT BLOOD, GUAIAC      Component Value Range   Occult Blood Negative x2     x2 CBC  10-09-11 WBC 13.1, ANC 8.4, Hgb 8.3 (had been 8.2 on 7-15) and plt 431 Studies/Results:  Nm Pet Image Initial (pi) Skull Base To Thigh  10/09/2011  *RADIOLOGY REPORT*  Clinical Data: Subsequent treatment strategy for metastatic breast cancer. Left breast cancer with bone metastasis and liver metastasis.  NUCLEAR MEDICINE PET SKULL BASE TO THIGH  Fasting Blood Glucose:  117  Technique:  15.4 mCi F-18 FDG was injected intravenously. CT data was obtained and used for attenuation correction and anatomic localization only.  (This was not acquired as a diagnostic CT examination.) Additional exam technical data entered on technologist worksheet.  Comparison:  CT 09/24/2011  Findings:  Neck: There is a mass centered within the left parapharyngeal space / medial masseter space which measures 4.0 x 2.8 cm (image 23) and has moderate metabolic activity ( SUV max = 5.3).  There is a likely of sclerotic metastasis to the C1 vertebral body on the left adjacent to this mass (image 22).  No additional hypermetabolic nodes in the neck  Chest:  There is consolidation in the left upper lobe with bronchiectasis and no significant metabolic activity.  No hypermetabolic mediastinal lymph nodes.  No hypermetabolic pulmonary nodules.  Abdomen/Pelvis:  Within the left hepatic lobe, 4.8 x 4.1 cm lesion is intensely hypermetabolic with SUV max = 10.2.  A second 1.5 cm metastasis is present in the posterior right hepatic lobe (image 128).  A third and fourth lesion are noted in the inferior aspect of the right hepatic lobe measuring 2.7 and 1.0 cm.  There is hydronephrosis of the left renal collecting system.  There is a soft tissue nodule along the course of the left ureter measuring 16 x 20 mm (image 180).  This is minimally hypermetabolic with SUV max = 4.0 (image 180).  [place]:  There are multiple foci of skeletal hypermetabolic metastasis. Intense activity surrounding the left femoral prosthetic. Example metastatic lesion includes the  anterior left iliac wing with SUV max = 73 (image 177).  There is a mixed sclerotic and lytic lesion at this level on the CT portion.  Lesion within the inferior trochanter on the right with SUV max = 5.8. Additional lesions are present throughout the pelvis, and spine.  IMPRESSION:  1. Large hypermetabolic soft tissue mass in the left parapharyngeal space likely represents a nodal metastasis. 2.  Multiple hypermetabolic liver metastasis. 3.  Small soft tissue nodule along the course of left ureter is mildly hypermetabolic.  This is indeterminate could represent a peritoneal metastasis with associated obstructive hydronephrosis. 4.  Widespread hypermetabolic skeletal metastasis involving the proximal femurs, pelvis, and spine.  Original Report Authenticated By: Genevive Bi, M.D.    Medications: I have reviewed the patient's current medications. We have refilled hydrocodone 5/325 # 60.  I have discussed stage 4 disease with patient and husband, telling them that this is not curable but that treatment may be very beneficial and seems very appropriate to try. She wants to be kept informed about her condition and understands that she can decide to stop treatment at any  point if she prefers. She does have Advance Directives completed, does not want resuscitation or life support in irreversible situation, husband is HCPOA.  Assessment/Plan: 1. Metastatic breast carcinoma consistent with primary from 2002: Appreciate assistance from Dr Victorino Dike and Dr Roselind Messier.  She will have zometa 10-22-11. I will see her back at least 10-27-11, which should hopefully be very close to completion of RT. If she meets eligibility criteria, we will proceed promptly with other studies required for Bolero 4 trial. If she is not eligible for study, we may use taxane (I have not discussed any specific chemotherapy with her yet). She will need PAC if IV chemo (also not discussed yet).  2.DNR patient's request, but full support to that  point now 3.osteoarthritis 4.osteoporosis 5.chronic insomnia 6.anxiety, long-standing  Patient and husband are comfortable with discussion and plan as above.  Mujahid Jalomo P, MD   10/10/2011, 10:40 PM

## 2011-10-10 NOTE — Patient Instructions (Addendum)
Blood Transfusion Information  WHAT IS A BLOOD TRANSFUSION?  A transfusion is the replacement of blood or some of its parts. Blood is made up of multiple cells which provide different functions.   Red blood cells carry oxygen and are used for blood loss replacement.   White blood cells fight against infection.   Platelets control bleeding.   Plasma helps clot blood.   Other blood products are available for specialized needs, such as hemophilia or other clotting disorders.  BEFORE THE TRANSFUSION   Who gives blood for transfusions?    You may be able to donate blood to be used at a later date on yourself (autologous donation).   Relatives can be asked to donate blood. This is generally not any safer than if you have received blood from a stranger. The same precautions are taken to ensure safety when a relative's blood is donated.   Healthy volunteers who are fully evaluated to make sure their blood is safe. This is blood bank blood.  Transfusion therapy is the safest it has ever been in the practice of medicine. Before blood is taken from a donor, a complete history is taken to make sure that person has no history of diseases nor engages in risky social behavior (examples are intravenous drug use or sexual activity with multiple partners). The donor's travel history is screened to minimize risk of transmitting infections, such as malaria. The donated blood is tested for signs of infectious diseases, such as HIV and hepatitis. The blood is then tested to be sure it is compatible with you in order to minimize the chance of a transfusion reaction. If you or a relative donates blood, this is often done in anticipation of surgery and is not appropriate for emergency situations. It takes many days to process the donated blood.  RISKS AND COMPLICATIONS  Although transfusion therapy is very safe and saves many lives, the main dangers of transfusion include:    Getting an infectious disease.   Developing a  transfusion reaction. This is an allergic reaction to something in the blood you were given. Every precaution is taken to prevent this.  The decision to have a blood transfusion has been considered carefully by your caregiver before blood is given. Blood is not given unless the benefits outweigh the risks.  AFTER THE TRANSFUSION   Right after receiving a blood transfusion, you will usually feel much better and more energetic. This is especially true if your red blood cells have gotten low (anemic). The transfusion raises the level of the red blood cells which carry oxygen, and this usually causes an energy increase.   The nurse administering the transfusion will monitor you carefully for complications.  HOME CARE INSTRUCTIONS   No special instructions are needed after a transfusion. You may find your energy is better. Speak with your caregiver about any limitations on activity for underlying diseases you may have.  SEEK MEDICAL CARE IF:    Your condition is not improving after your transfusion.   You develop redness or irritation at the intravenous (IV) site.  SEEK IMMEDIATE MEDICAL CARE IF:   Any of the following symptoms occur over the next 12 hours:   Shaking chills.   You have a temperature by mouth above 102 F (38.9 C), not controlled by medicine.   Chest, back, or muscle pain.   People around you feel you are not acting correctly or are confused.   Shortness of breath or difficulty breathing.   Dizziness and fainting.     You get a rash or develop hives.   You have a decrease in urine output.   Your urine turns a dark color or changes to pink, red, or brown.  Any of the following symptoms occur over the next 10 days:   You have a temperature by mouth above 102 F (38.9 C), not controlled by medicine.   Shortness of breath.   Weakness after normal activity.   The white part of the eye turns yellow (jaundice).   You have a decrease in the amount of urine or are urinating less often.   Your  urine turns a dark color or changes to pink, red, or brown.  Document Released: 03/07/2000 Document Revised: 02/27/2011 Document Reviewed: 10/25/2007  [employer] Patient Information 2012 [employer].

## 2011-10-10 NOTE — Telephone Encounter (Signed)
gv relative appt schedule for July/August and relative made aware I will call w/urology appt. S/w Gwyn[facility] (triage) @ [facility] and per [facility] she will call me back this afternoon or Monday w/appt.

## 2011-10-10 NOTE — Patient Instructions (Signed)
You can use the hydrocodone every 4 - 6 hours if needed

## 2011-10-11 LAB — TYPE AND SCREEN: Unit division: 0

## 2011-10-13 ENCOUNTER — Telehealth: Payer: Self-pay | Admitting: *Deleted

## 2011-10-13 ENCOUNTER — Ambulatory Visit: Admission: RE | Admit: 2011-10-13 | Payer: Medicare Other | Source: Ambulatory Visit

## 2011-10-13 ENCOUNTER — Ambulatory Visit
Admission: RE | Admit: 2011-10-13 | Discharge: 2011-10-13 | Disposition: A | Discharge status: Home / Self Care | Payer: Medicare Other | Source: Ambulatory Visit | Attending: Radiation Oncology | Admitting: Radiation Oncology

## 2011-10-13 DIAGNOSIS — C50919 Malignant neoplasm of unspecified site of unspecified female breast: Secondary | ICD-10-CM

## 2011-10-13 NOTE — Telephone Encounter (Signed)
10-27-2011 gave patient at 1:30pm changed lab only appointment to 4:00pm on 10-20-2011 printed out calendar and gave to the patient

## 2011-10-13 NOTE — Progress Notes (Signed)
  Radiation Oncology         [phone] ________________________________  Name: Kelli [name] MRN: [id number]  Date: 10/13/2011  DOB: 05/19/1940  SIMULATION AND TREATMENT PLANNING NOTE  DIAGNOSIS:  Metastatic breast cancer with osseous involvement of the left proximal leg  NARRATIVE:  The patient was brought to the CT Simulation planning suite.  Identity was confirmed.  All relevant records and images related to the planned course of therapy were reviewed.  The patient freely provided informed written consent to proceed with treatment after reviewing the details related to the planned course of therapy. The consent form was witnessed and verified by the simulation staff.  Then, the patient was set-up in a stable reproducible  supine position for radiation therapy.  CT images were obtained.  Surface markings were placed.  The CT images were loaded into the planning software.  Then the target and avoidance structures were contoured.  Treatment planning then occurred.  The radiation prescription was entered and confirmed.  A total of 3 complex treatment devices were fabricated. I have requested : Isodose Plan.   PLAN:  The patient will receive 30.0 Gy in 10 fractions.  ________________________________   Billie Lade, PhD, MD

## 2011-10-14 ENCOUNTER — Telehealth: Payer: Self-pay | Admitting: Oncology

## 2011-10-14 NOTE — Telephone Encounter (Signed)
S/w pt husband re appt for 7/31 @ 9:45 am w/Dr. Retta Diones. appt scheduled w/[facility] triage nurse.

## 2011-10-15 ENCOUNTER — Ambulatory Visit
Admission: RE | Admit: 2011-10-15 | Discharge: 2011-10-15 | Disposition: A | Discharge status: Home / Self Care | Payer: Medicare Other | Source: Ambulatory Visit | Attending: Radiation Oncology | Admitting: Radiation Oncology

## 2011-10-15 DIAGNOSIS — C7951 Secondary malignant neoplasm of bone: Secondary | ICD-10-CM

## 2011-10-15 NOTE — Progress Notes (Signed)
  Radiation Oncology         [phone] ________________________________  Name: Kelli [name] MRN: [id number]  Date: 10/15/2011  DOB: September 27, 1940  Simulation Verification Note  Status: outpatient  NARRATIVE: The patient was brought to the treatment unit and placed in the planned treatment position. The clinical setup was verified. Then port films were obtained and uploaded to the radiation oncology medical record software.  The treatment beams were carefully compared against the planned radiation fields. The position location and shape of the radiation fields was reviewed. They targeted volume of tissue appears to be appropriately covered by the radiation beams. Organs at risk appear to be excluded as planned.  Based on my personal review, I approved the simulation verification. The patient's treatment will proceed as planned.  -----------------------------------  Billie Lade, PhD, MD

## 2011-10-16 ENCOUNTER — Ambulatory Visit
Admission: RE | Admit: 2011-10-16 | Discharge: 2011-10-16 | Disposition: A | Discharge status: Home / Self Care | Payer: Medicare Other | Source: Ambulatory Visit | Attending: Radiation Oncology | Admitting: Radiation Oncology

## 2011-10-17 ENCOUNTER — Ambulatory Visit
Admission: RE | Admit: 2011-10-17 | Discharge: 2011-10-17 | Disposition: A | Discharge status: Home / Self Care | Payer: Medicare Other | Source: Ambulatory Visit | Attending: Radiation Oncology | Admitting: Radiation Oncology

## 2011-10-20 ENCOUNTER — Encounter: Payer: Self-pay | Admitting: Radiation Oncology

## 2011-10-20 ENCOUNTER — Other Ambulatory Visit (HOSPITAL_BASED_OUTPATIENT_CLINIC_OR_DEPARTMENT_OTHER): Payer: Medicare Other

## 2011-10-20 ENCOUNTER — Ambulatory Visit
Admission: RE | Admit: 2011-10-20 | Discharge: 2011-10-20 | Disposition: A | Discharge status: Home / Self Care | Payer: Medicare Other | Source: Ambulatory Visit | Attending: Radiation Oncology | Admitting: Radiation Oncology

## 2011-10-20 DIAGNOSIS — C50919 Malignant neoplasm of unspecified site of unspecified female breast: Secondary | ICD-10-CM

## 2011-10-20 DIAGNOSIS — D649 Anemia, unspecified: Secondary | ICD-10-CM

## 2011-10-20 DIAGNOSIS — C50419 Malignant neoplasm of upper-outer quadrant of unspecified female breast: Secondary | ICD-10-CM

## 2011-10-20 LAB — BASIC METABOLIC PANEL
BUN: 16 mg/dL (ref 6–23)
Calcium: 9.7 mg/dL (ref 8.4–10.5)
Glucose, Bld: 169 mg/dL — ABNORMAL HIGH (ref 70–99)
Sodium: 136 mEq/L (ref 135–145)

## 2011-10-20 LAB — CBC WITH DIFFERENTIAL/PLATELET
Basophils Absolute: 0 10*3/uL (ref 0.0–0.1)
Eosinophils Absolute: 0.1 10*3/uL (ref 0.0–0.5)
HGB: 10 g/dL — ABNORMAL LOW (ref 11.6–15.9)
MCV: 94.8 fL (ref 79.5–101.0)
MONO#: 0.6 10*3/uL (ref 0.1–0.9)
NEUT#: 5.9 10*3/uL (ref 1.5–6.5)
RBC: 3.31 10*6/uL — ABNORMAL LOW (ref 3.70–5.45)
RDW: 17.7 % — ABNORMAL HIGH (ref 11.2–14.5)
WBC: 8.6 10*3/uL (ref 3.9–10.3)
lymph#: 2 10*3/uL (ref 0.9–3.3)

## 2011-10-21 ENCOUNTER — Ambulatory Visit
Admission: RE | Admit: 2011-10-21 | Discharge: 2011-10-21 | Disposition: A | Discharge status: Home / Self Care | Payer: Medicare Other | Source: Ambulatory Visit | Attending: Radiation Oncology | Admitting: Radiation Oncology

## 2011-10-21 DIAGNOSIS — C50919 Malignant neoplasm of unspecified site of unspecified female breast: Secondary | ICD-10-CM

## 2011-10-21 NOTE — Progress Notes (Signed)
Weekly Management Note Current Dose: 15.0  Gy  Projected Dose: 30.0 Gy   Narrative:  The patient presents for routine under treatment assessment.  CBCT/MVCT images/Port film x-rays were reviewed.  The chart was checked.  She is tolerating her radiation therapy well. She denies any significant pain in the left upper leg area  Physical Findings: Weight:  . The lungs are clear. The heart has a regular rhythm and rate.  Impression:  The patient is tolerating radiation.  Plan:  Continue treatment as planned.   -----------------------------------  Billie Lade, PhD, MD

## 2011-10-22 ENCOUNTER — Ambulatory Visit (HOSPITAL_BASED_OUTPATIENT_CLINIC_OR_DEPARTMENT_OTHER): Payer: Medicare Other

## 2011-10-22 ENCOUNTER — Other Ambulatory Visit: Payer: Self-pay | Admitting: Oncology

## 2011-10-22 ENCOUNTER — Ambulatory Visit
Admission: RE | Admit: 2011-10-22 | Discharge: 2011-10-22 | Disposition: A | Discharge status: Home / Self Care | Payer: Medicare Other | Source: Ambulatory Visit | Attending: Radiation Oncology | Admitting: Radiation Oncology

## 2011-10-22 VITALS — BP 113/64 | HR 93 | Temp 99.3°F

## 2011-10-22 DIAGNOSIS — C7952 Secondary malignant neoplasm of bone marrow: Secondary | ICD-10-CM

## 2011-10-22 DIAGNOSIS — C7951 Secondary malignant neoplasm of bone: Secondary | ICD-10-CM

## 2011-10-22 DIAGNOSIS — C50419 Malignant neoplasm of upper-outer quadrant of unspecified female breast: Secondary | ICD-10-CM

## 2011-10-22 MED ORDER — ZOLEDRONIC ACID 4 MG/100ML IV SOLN
4.0000 mg | Freq: Once | INTRAVENOUS | Status: AC
  Administered 2011-10-22: 4 mg via INTRAVENOUS
  Filled 2011-10-22: qty 100

## 2011-10-22 MED ORDER — SODIUM CHLORIDE 0.9 % IV SOLN: Freq: Once | INTRAVENOUS | Status: DC

## 2011-10-22 NOTE — Patient Instructions (Signed)
[facility] Discharge Instructions for Patients Receiving Chemotherapy  Today you received the following chemotherapy agents Zometa  To help prevent nausea and vomiting after your treatment, we encourage you to take your nausea medication Begin taking it at 7 pm and take it as often as prescribed for the next 24 to 72 hours.   If you develop nausea and vomiting that is not controlled by your nausea medication, call the clinic. If it is after clinic hours your family physician or the after hours number for the clinic or go to the Emergency Department.   BELOW ARE SYMPTOMS THAT SHOULD BE REPORTED IMMEDIATELY:  *FEVER GREATER THAN 100.5 F  *CHILLS WITH OR WITHOUT FEVER  NAUSEA AND VOMITING THAT IS NOT CONTROLLED WITH YOUR NAUSEA MEDICATION  *UNUSUAL SHORTNESS OF BREATH  *UNUSUAL BRUISING OR BLEEDING  TENDERNESS IN MOUTH AND THROAT WITH OR WITHOUT PRESENCE OF ULCERS  *URINARY PROBLEMS  *BOWEL PROBLEMS  UNUSUAL RASH Items with * indicate a potential emergency and should be followed up as soon as possible.  One of the nurses will contact you 24 hours after your treatment. Please let the nurse know about any problems that you may have experienced. Feel free to call the clinic you have any questions or concerns. The clinic phone number is [phone].   I have been informed and understand all the instructions given to me. I know to contact the clinic, my physician, or go to the Emergency Department if any problems should occur. I do not have any questions at this time, but understand that I may call the clinic during office hours   should I have any questions or need assistance in obtaining follow up care.    __________________________________________  _____________  __________ Signature of Patient or Authorized Representative            Date                   Time    __________________________________________ Nurse's Signature    

## 2011-10-23 ENCOUNTER — Ambulatory Visit
Admission: RE | Admit: 2011-10-23 | Discharge: 2011-10-23 | Disposition: A | Discharge status: Home / Self Care | Payer: Medicare Other | Source: Ambulatory Visit | Attending: Radiation Oncology | Admitting: Radiation Oncology

## 2011-10-23 ENCOUNTER — Encounter (HOSPITAL_BASED_OUTPATIENT_CLINIC_OR_DEPARTMENT_OTHER): Payer: Self-pay | Admitting: *Deleted

## 2011-10-23 ENCOUNTER — Other Ambulatory Visit: Payer: Self-pay | Admitting: Urology

## 2011-10-24 ENCOUNTER — Encounter (HOSPITAL_BASED_OUTPATIENT_CLINIC_OR_DEPARTMENT_OTHER): Payer: Self-pay | Admitting: *Deleted

## 2011-10-24 ENCOUNTER — Ambulatory Visit
Admission: RE | Admit: 2011-10-24 | Discharge: 2011-10-24 | Disposition: A | Discharge status: Home / Self Care | Payer: Medicare Other | Source: Ambulatory Visit | Attending: Radiation Oncology | Admitting: Radiation Oncology

## 2011-10-24 NOTE — Progress Notes (Signed)
NPO AFTER MN. ARRIVES AT 1015. NEEDS HG. CURRENT CXR AND EKG IN EPIC AND CHART. WILL TAKE SYNTHROID AM OF W/ SIP OF WATER.

## 2011-10-27 ENCOUNTER — Ambulatory Visit
Admission: RE | Admit: 2011-10-27 | Discharge: 2011-10-27 | Disposition: A | Discharge status: Home / Self Care | Payer: Medicare Other | Source: Ambulatory Visit | Attending: Radiation Oncology | Admitting: Radiation Oncology

## 2011-10-27 ENCOUNTER — Other Ambulatory Visit (HOSPITAL_BASED_OUTPATIENT_CLINIC_OR_DEPARTMENT_OTHER): Payer: Medicare Other | Admitting: Lab

## 2011-10-27 ENCOUNTER — Other Ambulatory Visit: Payer: Self-pay | Admitting: *Deleted

## 2011-10-27 ENCOUNTER — Ambulatory Visit (HOSPITAL_BASED_OUTPATIENT_CLINIC_OR_DEPARTMENT_OTHER): Payer: Medicare Other | Admitting: Oncology

## 2011-10-27 DIAGNOSIS — C7951 Secondary malignant neoplasm of bone: Secondary | ICD-10-CM

## 2011-10-27 DIAGNOSIS — C50419 Malignant neoplasm of upper-outer quadrant of unspecified female breast: Secondary | ICD-10-CM

## 2011-10-27 DIAGNOSIS — C50919 Malignant neoplasm of unspecified site of unspecified female breast: Secondary | ICD-10-CM

## 2011-10-27 DIAGNOSIS — D649 Anemia, unspecified: Secondary | ICD-10-CM

## 2011-10-27 DIAGNOSIS — C787 Secondary malignant neoplasm of liver and intrahepatic bile duct: Secondary | ICD-10-CM

## 2011-10-27 LAB — CBC WITH DIFFERENTIAL/PLATELET
Basophils Absolute: 0 10*3/uL (ref 0.0–0.1)
Eosinophils Absolute: 0.1 10*3/uL (ref 0.0–0.5)
HCT: 30.1 % — ABNORMAL LOW (ref 34.8–46.6)
HGB: 9.7 g/dL — ABNORMAL LOW (ref 11.6–15.9)
LYMPH%: 17.3 % (ref 14.0–49.7)
MCHC: 32.4 g/dL (ref 31.5–36.0)
MONO#: 0.6 10*3/uL (ref 0.1–0.9)
NEUT#: 5.6 10*3/uL (ref 1.5–6.5)
NEUT%: 73 % (ref 38.4–76.8)
Platelets: 203 10*3/uL (ref 145–400)
WBC: 7.7 10*3/uL (ref 3.9–10.3)
lymph#: 1.3 10*3/uL (ref 0.9–3.3)

## 2011-10-27 NOTE — H&P (Signed)
H&P  Chief Complaint: blocked kidney  History of Present Illness: Kelli [name] is a 71 y.o. year old with the following history:  She was sent by Dr. Darrold Span last week for further management of left hydronephrosis. The patient's history of breast cancer dates back to approximately 2010. She was treated then for nodal positive disease. Earlier this month, she was found to have a left femoral fracture, which was fixed by Dr. Earlie Counts. She was found to have multiple sites of metastatic disease within the abdomen, as well as her pathologic fracture. She was found to have left hydronephrosis secondary to retroperitoneal recurrence as well. She has had a PET scan performed.  She is having no flank pain, gross hematuria or dysuria. She will be getting either targeted therapy or chemotherapy near future, following radiotherapy.   Past Medical History  Diagnosis Date  . Thyroid disease IN 1982    HX IODINE-131 ABLATION FOR HYPERTHYROIDISM  . Osteoporosis   . Vitamin D insufficiency   . Chronic fatigue   . Depression   . Anxiety   . Insomnia   . History of chemotherapy     Adriamycin/cytoxan followed by [facility]  08/2000/tamoxifen 06/2001-02/2003,then arimidex 02/2003-08/2009  . 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 History of radiation therapy 12/28/00-02/12/01    left breast  With Dr. Jamie Kato  . Hypothyroidism   . Breast cancer, left JUNE OF 2002    LEFT BREAST, ER/PR +, HER 2 -  . 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 Metastatic cancer to liver 09/24/11    CURRENTLY RECEIVING RADIATION  . Fatigue   . Short of breath on exertion     Past Surgical History  Procedure Date  . Cholecystectomy 1970  . Femur im nail 09/24/2011 FEMURAL NECK FX/ INTERTROCHANTERIC PATHOLOGIC FX    Procedure: INTRAMEDULLARY (IM) NAIL FEMORAL;  Surgeon: Toni Arthurs, MD;  Location: [facility];  Service: Orthopedics;  Laterality: Left;  . Bone biopsy 09/25/2011    left medullary canal  femur reaming=Metastatic Carcinoma  . Hysteroscopy w/d&c 01-11-2003    REMOVAL POLYP  . Left partial mastectomy / sentinel lymph node bx 08-28-2000    LEFT BREAST CANCER    Home Medications:  No prescriptions prior to admission    Allergies:  Allergies  Allergen Reactions  . Other     "MYCIN" DRUGS  . Sulfa Antibiotics     History reviewed. No pertinent family history.  Social History:  reports that she has never smoked. She has never used smokeless tobacco. She reports that she does not drink alcohol or use illicit drugs.  ROS: Genitourinary, constitutional, skin, eye, otolaryngeal, hematologic/lymphatic, cardiovascular, pulmonary, endocrine, musculoskeletal, gastrointestinal, neurological and psychiatric system(s) were reviewed and pertinent findings if present are noted.  Genitourinary: urinary urgency and nocturia.  Gastrointestinal: abdominal pain and diarrhea.  Constitutional: night sweats and feeling tired (fatigue).  Hematologic/Lymphatic: a tendency to easily bruise.  Respiratory: shortness of breath.  Musculoskeletal: back pain and joint pain.  Neurological: headache.  Psychiatric: depression and anxiety.      Physical Exam:  Vital signs in last 24 hours:   Constitutional: Well nourished and well developed . No acute distress.  ENT:. The ears and nose are normal in appearance.  Neck: The appearance of the neck is normal.  Pulmonary: No respiratory distress and normal respiratory rhythm and effort.  Skin: Normal skin turgor, no visible rash and no visible skin lesions.  Neuro/Psych:. Mood and affect are  appropriate.      Laboratory Data:  No results found for this or any previous visit (from the past 24 hour(s)). No results found for this or any previous visit (from the past 240 hour(s)). Creatinine:  Basename 10/20/11 1557  CREATININE 0.80    Radiologic Imaging: No results found.  Impression/Assessment:  Left-sided hydronephrosis, malignant  etiology  Plan:  Cystoscopy, placement of left double-J stent  Chelsea Aus 10/27/2011, 9:59 AM  Bertram Millard. Tamie Minteer MD

## 2011-10-28 ENCOUNTER — Encounter: Payer: Self-pay | Admitting: Radiation Oncology

## 2011-10-28 ENCOUNTER — Ambulatory Visit
Admission: RE | Admit: 2011-10-28 | Discharge: 2011-10-28 | Disposition: A | Discharge status: Home / Self Care | Payer: Medicare Other | Source: Ambulatory Visit | Attending: Radiation Oncology | Admitting: Radiation Oncology

## 2011-10-28 ENCOUNTER — Encounter: Payer: Self-pay | Admitting: Oncology

## 2011-10-28 ENCOUNTER — Other Ambulatory Visit: Payer: Self-pay | Admitting: Medical Oncology

## 2011-10-28 DIAGNOSIS — C7951 Secondary malignant neoplasm of bone: Secondary | ICD-10-CM

## 2011-10-28 DIAGNOSIS — G4700 Insomnia, unspecified: Secondary | ICD-10-CM

## 2011-10-28 MED ORDER — ZOLPIDEM TARTRATE 5 MG PO TABS: 5.0000 mg | ORAL_TABLET | Freq: Every evening | ORAL | Status: DC | PRN

## 2011-10-28 NOTE — Progress Notes (Signed)
OFFICE PROGRESS NOTE   10/27/2011  Physicians:J.Hewitt, J.Kinard, J.Beekman, W.Elkins, M.Altheimer, S.Dahlstedt   INTERVAL HISTORY:  Patient is seen, together with husband, in continuing attention to her metastatic breast cancer which is involving bone and liver, as well as area on PET in left parapharyngeal space and adjacent to left ureter. She continues radiation to left femur, with pathologic fracture that was pinned on 09-24-11. We hope to treat on Bolero 4 trial with everolimus + letrozole beginning 2 weeks after completion of RT, this planned thru 10-28-11.  She has had IV bisphosphonate in hospital and again on 10-22-11, and had full dose feraheme on 10-10-11. She will need research labs and repeat CT or MRI head/chest/abd/pelvis and bone scan closer to initiation of study. We may need to transfuse PRBCs again depending on repeat CBC then, especially with present RT.  Oncologic history is of T1N1 invasive ductal eft breast cancer diagnosed June 2002 with 13 nodes involved including extracapsular extension, ER + 67%, PR + 39%, Her 2 0. She was treated with lumpectomy and 18 node axillary node dissection, adjuvant adriamycin/cytoxan followed by [facility] chemotherapy, local radiation, tamoxifen from April 2003 thru Dec 2003 then arimidex from Dec 2004 thru June 2011. The arimidex had been continued for that long duration due to high risk features, stopped as there had been no evidence of disease and with known osteoporosis. She was clinically stable and labs were normal other than very slight decrease in hemoglobin when I saw her in Jan 2013. She developed acute pain in left hip in mid June which did not respond to pain medication by rheumatologist, then MRI hip 09-20-11 showed metastatic disease with pathologic fracture of femoral neck. She was admitted 7-3 thru 09-26-11, with nailing by Dr Victorino Dike on 09-24-11. Pathology from the surgery confirmed metastatic breast carcinoma, ER+ 100%, PR negative and HER 2 negative  by [facility].   Patient is more comfortable with slightly increased pain medication now, tho she is still reluctant to use this as often as perhaps she needs it. Appetite is some better. She is more mobile at home, able to manage stairs. She is not having any difficulty with the RT. She denies increased shortness of breath, cough, nausea or vomiting, HA or other neurologic symptoms, fever or symptoms of infection, any bleeding. Bowels are moving. She notices pain mostly low back and right lateral chest, with very little discomfort now LLE. She has no swelling LE. Remainder of 10 point Review of Systems negative.  She is to have ureteral stent placed by Dr Retta Diones within the week Objective:  Vital signs in last 24 hours: Weight 140.7 lbs, temp 98.9, HR 94 regular, respirations 20 and not labored RA, BP 126/73.   HEENT:PERRLA, extra ocular movement intact, sclera clear, anicteric, oropharynx clear, no lesions and neck supple with midline trachea LymphaticsCervical, supraclavicular, and axillary nodes normal. Resp: clear to auscultation bilaterally and normal percussion bilaterally Back and right chest wall not tender to palpation Cardio: regular rate and rhythm GI: soft, non-tender; bowel sounds normal; no masses,  no organomegaly Extremities: extremities normal, atraumatic, no cyanosis or edema  Neuro: awake and alert, fully oriented and appropriate. Speech fluent. Ambulatory and able to get on and off exam table with minimal assistance. CN intact. Cerebellar nonfocal. Sensory intact to light touch thruout. Muscle strength symmetrical bilaterally Breast:exam: left with well healed lumpectomy site, no dominant mass or skin/ nipple findings and nothing palpable left axilla, no swelling LUE. Right breast without dominant mass, skin or nipple changes  and nothing right axilla. No Portacath or PICC.  Skin without rash or eccchymoses  ECOG performance status 2 Lab Results:  Results for orders placed  in visit on 10/27/11  CBC WITH DIFFERENTIAL      Component Value Range   WBC 7.7  3.9 - 10.3 10e3/uL   NEUT# 5.6  1.5 - 6.5 10e3/uL   HGB 9.7 (*) 11.6 - 15.9 g/dL   HCT 16.1 (*) 09.6 - 04.5 %   Platelets 203  145 - 400 10e3/uL   MCV 96.0  79.5 - 101.0 fL   MCH 31.2  25.1 - 34.0 pg   MCHC 32.4  31.5 - 36.0 g/dL   RBC 4.09 (*) 8.11 - 9.14 10e6/uL   RDW 18.2 (*) 11.2 - 14.5 %   lymph# 1.3  0.9 - 3.3 10e3/uL   MONO# 0.6  0.1 - 0.9 10e3/uL   Eosinophils Absolute 0.1  0.0 - 0.5 10e3/uL   Basophils Absolute 0.0  0.0 - 0.1 10e3/uL   NEUT% 73.0  38.4 - 76.8 %   LYMPH% 17.3  14.0 - 49.7 %   MONO% 8.0  0.0 - 14.0 %   EOS% 1.3  0.0 - 7.0 %   BASO% 0.4  0.0 - 2.0 %    She was transfused 1 unit PRBCs on 10-10-11  Studies/Results:  No results found.  Medications: I have reviewed the patient's current medications. No changes in pain medication now.  We have discussed extent of disease and the fact that this is not curable but may well be improved either with hormonal treatment on study with letrozole + everolimus, or with various chemotherapy agents. She is concerned about possible side effects of everolimus as listed in the consent papers, and we have discussed. She understands that her history seems to me to make her a good candidate for response to the hormonal interventions, but that she can certainly opt for treatment off study if she prefers. She understands that we are waiting to begin systemic treatment (other than IV bisphosphonate) to meet study requirements. She will talk further with Research RN.  Assessment/Plan:  1. Metastatic ER + HER2 negative breast cancer: history as above. Continuing RT, then systemic treatment.  2.poor peripheral IV access: adequate for monthly bisphosphonate now but will need PAC if chemotherapy 3.DNR patient's request but full support to that point 4. Osteoporosis 5.osteoarthritis 6. Chronic insomnia 7. Anxiety, long - standing and exacerbated by present  illness 8. 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 Follow. 9. Left ureteral obstruction: for stent by Dr Retta Diones.   I did not address hepatitis risk factors with patient at this visit and will add that information in an addendum.  Kelli [name] P, MD   10/28/2011, 3:02 PM

## 2011-10-28 NOTE — Progress Notes (Signed)
HERE TODAY FOR PUT OF BONE CANCER,.........Marland KitchenLEFT FEMUR AND SHOULDER.  RATES PAIN AS 4/10 NOW, HAS HAD NO PAIN MED TODAY.  SHE IS ON CLINICAL TRIAL AND HOPES TO BE ON PO CHEMO

## 2011-10-28 NOTE — Progress Notes (Signed)
Weekly Management Note:  Site:L femur Current Dose:  3000  cGy Projected Dose: 3000  cGy  Narrative: The patient is seen today for routine under treatment assessment. CBCT/MVCT images/port films were reviewed. The chart was reviewed.   No new complaints today. She states that her left femur pain is improved, currently 4/10 without any pain medication today. She continues to use a walker or cane for ambulation.  Physical Examination: There were no vitals filed for this visit..  Weight:  . No significant skin changes.  Impression: Radiation therapy well tolerated.  Plan: Radiation therapy completed and followup with Dr Roselind Messier in one month.Marland Kitchen

## 2011-10-29 ENCOUNTER — Other Ambulatory Visit: Payer: Self-pay | Admitting: *Deleted

## 2011-10-29 DIAGNOSIS — C7951 Secondary malignant neoplasm of bone: Secondary | ICD-10-CM

## 2011-10-30 ENCOUNTER — Encounter (HOSPITAL_BASED_OUTPATIENT_CLINIC_OR_DEPARTMENT_OTHER): Payer: Self-pay | Admitting: *Deleted

## 2011-10-30 ENCOUNTER — Ambulatory Visit (HOSPITAL_BASED_OUTPATIENT_CLINIC_OR_DEPARTMENT_OTHER): Payer: Medicare Other | Admitting: Anesthesiology

## 2011-10-30 ENCOUNTER — Encounter (HOSPITAL_BASED_OUTPATIENT_CLINIC_OR_DEPARTMENT_OTHER): Payer: Self-pay | Admitting: Anesthesiology

## 2011-10-30 ENCOUNTER — Encounter (HOSPITAL_BASED_OUTPATIENT_CLINIC_OR_DEPARTMENT_OTHER)
Admission: RE | Disposition: A | Discharge status: Home / Self Care | Payer: Self-pay | Source: Ambulatory Visit | Attending: Urology

## 2011-10-30 ENCOUNTER — Telehealth: Payer: Self-pay | Admitting: *Deleted

## 2011-10-30 ENCOUNTER — Ambulatory Visit (HOSPITAL_BASED_OUTPATIENT_CLINIC_OR_DEPARTMENT_OTHER)
Admission: RE | Admit: 2011-10-30 | Discharge: 2011-10-30 | Disposition: A | Discharge status: Home / Self Care | Payer: Medicare Other | Source: Ambulatory Visit | Attending: Urology | Admitting: Urology

## 2011-10-30 DIAGNOSIS — M810 Age-related osteoporosis without current pathological fracture: Secondary | ICD-10-CM | POA: Insufficient documentation

## 2011-10-30 DIAGNOSIS — C787 Secondary malignant neoplasm of liver and intrahepatic bile duct: Secondary | ICD-10-CM | POA: Insufficient documentation

## 2011-10-30 DIAGNOSIS — Z853 Personal history of malignant neoplasm of breast: Secondary | ICD-10-CM | POA: Insufficient documentation

## 2011-10-30 DIAGNOSIS — C7951 Secondary malignant neoplasm of bone: Secondary | ICD-10-CM | POA: Insufficient documentation

## 2011-10-30 DIAGNOSIS — N1330 Unspecified hydronephrosis: Secondary | ICD-10-CM | POA: Insufficient documentation

## 2011-10-30 DIAGNOSIS — E039 Hypothyroidism, unspecified: Secondary | ICD-10-CM | POA: Insufficient documentation

## 2011-10-30 HISTORY — DX: Secondary malignant neoplasm of liver and intrahepatic bile duct: C78.7

## 2011-10-30 HISTORY — DX: Other fatigue: R53.83

## 2011-10-30 HISTORY — PX: OTHER SURGICAL HISTORY: SHX169

## 2011-10-30 HISTORY — DX: Hypothyroidism, unspecified: E03.9

## 2011-10-30 HISTORY — DX: Shortness of breath: R06.02

## 2011-10-30 HISTORY — DX: Secondary malignant neoplasm of bone: C79.51

## 2011-10-30 HISTORY — DX: Malignant neoplasm of unspecified site of left female breast: C50.912

## 2011-10-30 LAB — POCT HEMOGLOBIN-HEMACUE: Hemoglobin: 10.4 g/dL — ABNORMAL LOW (ref 12.0–15.0)

## 2011-10-30 SURGERY — CYSTOSCOPY, WITH STENT INSERTION
Anesthesia: General | Site: Ureter | Laterality: Left | Wound class: Clean Contaminated

## 2011-10-30 MED ORDER — LIDOCAINE HCL (CARDIAC) 20 MG/ML IV SOLN
INTRAVENOUS | Status: DC | PRN
  Administered 2011-10-30: 40 mg via INTRAVENOUS

## 2011-10-30 MED ORDER — LACTATED RINGERS IV SOLN: INTRAVENOUS | Status: DC

## 2011-10-30 MED ORDER — CEFAZOLIN SODIUM-DEXTROSE 2-3 GM-% IV SOLR
2.0000 g | INTRAVENOUS | Status: DC
Start: 2011-10-30 — End: 2011-10-30

## 2011-10-30 MED ORDER — OXYCODONE HCL 5 MG PO TABS: 5.0000 mg | ORAL_TABLET | ORAL | Status: DC | PRN

## 2011-10-30 MED ORDER — FENTANYL CITRATE 0.05 MG/ML IJ SOLN: 25.0000 ug | INTRAMUSCULAR | Status: DC | PRN

## 2011-10-30 MED ORDER — PROMETHAZINE HCL 25 MG/ML IJ SOLN: 6.2500 mg | INTRAMUSCULAR | Status: DC | PRN

## 2011-10-30 MED ORDER — MORPHINE SULFATE 2 MG/ML IJ SOLN: 2.0000 mg | INTRAMUSCULAR | Status: DC | PRN

## 2011-10-30 MED ORDER — CEPHALEXIN 250 MG PO CAPS: 250.0000 mg | ORAL_CAPSULE | Freq: Four times a day (QID) | ORAL | Status: AC

## 2011-10-30 MED ORDER — PROPOFOL 10 MG/ML IV EMUL
INTRAVENOUS | Status: DC | PRN
  Administered 2011-10-30: 150 mg via INTRAVENOUS

## 2011-10-30 MED ORDER — OXYBUTYNIN CHLORIDE 5 MG PO TABS: 5.0000 mg | ORAL_TABLET | Freq: Three times a day (TID) | ORAL | Status: DC

## 2011-10-30 MED ORDER — ACETAMINOPHEN 650 MG RE SUPP: 650.0000 mg | RECTAL | Status: DC | PRN

## 2011-10-30 MED ORDER — LACTATED RINGERS IV SOLN
INTRAVENOUS | Status: DC
  Administered 2011-10-30: 10:00:00 via INTRAVENOUS

## 2011-10-30 MED ORDER — CEFAZOLIN SODIUM 1-5 GM-% IV SOLN: 1.0000 g | INTRAVENOUS | Status: DC

## 2011-10-30 MED ORDER — SODIUM CHLORIDE 0.9 % IJ SOLN: 3.0000 mL | Freq: Two times a day (BID) | INTRAMUSCULAR | Status: DC

## 2011-10-30 MED ORDER — ACETAMINOPHEN 325 MG PO TABS: 650.0000 mg | ORAL_TABLET | ORAL | Status: DC | PRN

## 2011-10-30 MED ORDER — SODIUM CHLORIDE 0.9 % IJ SOLN: 3.0000 mL | INTRAMUSCULAR | Status: DC | PRN

## 2011-10-30 MED ORDER — ONDANSETRON HCL 4 MG/2ML IJ SOLN: 4.0000 mg | Freq: Four times a day (QID) | INTRAMUSCULAR | Status: DC | PRN

## 2011-10-30 MED ORDER — BELLADONNA ALKALOIDS-OPIUM 16.2-60 MG RE SUPP
RECTAL | Status: DC | PRN
  Administered 2011-10-30: 1 via RECTAL

## 2011-10-30 MED ORDER — MEPERIDINE HCL 25 MG/ML IJ SOLN: 6.2500 mg | INTRAMUSCULAR | Status: DC | PRN

## 2011-10-30 MED ORDER — SODIUM CHLORIDE 0.9 % IR SOLN
Status: DC | PRN
  Administered 2011-10-30: 3000 mL

## 2011-10-30 MED ORDER — SODIUM CHLORIDE 0.9 % IV SOLN: 250.0000 mL | INTRAVENOUS | Status: DC | PRN

## 2011-10-30 MED ORDER — FENTANYL CITRATE 0.05 MG/ML IJ SOLN
INTRAMUSCULAR | Status: DC | PRN
  Administered 2011-10-30: 50 ug via INTRAVENOUS
  Administered 2011-10-30: 25 ug via INTRAVENOUS

## 2011-10-30 MED ORDER — IOHEXOL 350 MG/ML SOLN
INTRAVENOUS | Status: DC | PRN
  Administered 2011-10-30: 25 mL

## 2011-10-30 MED ORDER — ONDANSETRON HCL 4 MG/2ML IJ SOLN
INTRAMUSCULAR | Status: DC | PRN
  Administered 2011-10-30: 4 mg via INTRAVENOUS

## 2011-10-30 SURGICAL SUPPLY — 24 items
ADAPTER CATH URET PLST 4-6FR (CATHETERS) IMPLANT
ADPR CATH URET STRL DISP 4-6FR (CATHETERS)
BAG DRAIN URO-CYSTO SKYTR STRL (DRAIN) ×2 IMPLANT
CANISTER SUCT LVC 12 LTR MEDI- (MISCELLANEOUS) ×2 IMPLANT
CATH INTERMIT  6FR 70CM (CATHETERS) IMPLANT
CATH URET 5FR 28IN CONE TIP (BALLOONS)
CATH URET 5FR 28IN OPEN ENDED (CATHETERS) IMPLANT
CATH URET 5FR 70CM CONE TIP (BALLOONS) IMPLANT
CLOTH BEACON ORANGE TIMEOUT ST (SAFETY) ×2 IMPLANT
DRAPE CAMERA CLOSED 9X96 (DRAPES) ×2 IMPLANT
GLOVE BIO SURGEON STRL SZ8 (GLOVE) ×2 IMPLANT
GLOVE BIOGEL PI IND STRL 6.5 (GLOVE) ×1 IMPLANT
GLOVE BIOGEL PI IND STRL 7.0 (GLOVE) ×1 IMPLANT
GLOVE BIOGEL PI INDICATOR 6.5 (GLOVE) ×1
GLOVE BIOGEL PI INDICATOR 7.0 (GLOVE) ×1
GOWN PREVENTION PLUS LG XLONG (DISPOSABLE) ×2 IMPLANT
GOWN STRL REIN XL XLG (GOWN DISPOSABLE) ×2 IMPLANT
GUIDEWIRE 0.038 PTFE COATED (WIRE) IMPLANT
GUIDEWIRE ANG ZIPWIRE 038X150 (WIRE) IMPLANT
GUIDEWIRE STR DUAL SENSOR (WIRE) IMPLANT
IV NS IRRIG 3000ML ARTHROMATIC (IV SOLUTION) ×2 IMPLANT
NS IRRIG 500ML POUR BTL (IV SOLUTION) IMPLANT
PACK CYSTOSCOPY (CUSTOM PROCEDURE TRAY) ×2 IMPLANT
STENT CONTOUR 7FRX24 (STENTS) ×2 IMPLANT

## 2011-10-30 NOTE — Op Note (Signed)
Preoperative diagnosis: Left hydronephrosis  Postoperative diagnosis: Same  Principal procedure: Cystoscopy, left retrograde ureteropyelogram with interpretive fluoroscopy, left double-J stent placement (7 [place] by 24 cm contour without string)  Surgeon: Ruari Duggan  Anesthesia: Gen. with mask  Complications: None  Indications:She was sent by Dr. Darrold Span last week for further management of left hydronephrosis. The patient's history of breast cancer dates back to approximately 2010. She was treated then for nodal positive disease. Earlier this month, she was found to have a left femoral fracture, which was fixed by Dr. Earlie Counts. She was found to have multiple sites of metastatic disease within the abdomen, as well as her pathologic fracture. She was found to have left hydronephrosis secondary to retroperitoneal recurrence as well. She has had a PET scan performed.  She is having no flank pain, gross hematuria or dysuria. She will be getting either targeted therapy or chemotherapy near future, following radiotherapy.  She presents at this time for double-J stent placement. Risks and complications of the procedure have been discussed with the patient and her husband, including the fact that sometimes double-J stent do not work and resolving the hydronephrosis.  Procedure: The patient was properly identified and marked in the holding area and received preoperative IV antibiotics. She was taken to the operating room where general mask anesthetic was administered. She was placed in the dorsolithotomy position. Proper timeout was then performed.   A 22 French panendoscope was advanced into her bladder which was inspected circumferentially. No tumors or trabeculations were noted, no foreign bodies were present. A 6 French open-ended catheter was then used to perform a retrograde on the left.  The retrograde revealed significant narrowing of the left ureter in the entire mid and proximal portion, with the  ureter deviated medially. The ureter was quite stent, and it was difficult getting contrast through to the more proximal ureter. I could not fill the patient's pelvic calyceal system of the contrast due to the narrowing. At this point, I passed a guidewire up into the renal pelvis using fluoroscopic guidance, and over top of this I placed a 24 cm 7 French contour stent with the string removed. Good positioning was seen proximally and distally using fluoroscopic and cystoscopic guidance. Following removal of the guidewire, the bladder was drained and the scope removed. The patient tolerated procedure well and was returned to the PACU in stable condition.

## 2011-10-30 NOTE — Anesthesia Preprocedure Evaluation (Addendum)
Anesthesia Evaluation  Patient identified by MRN, date of birth, ID band Patient awake    Reviewed: Allergy & Precautions, H&P , NPO status , Patient's Chart, lab work & pertinent test results  Airway Mallampati: II TM Distance: >3 FB Neck ROM: Full    Dental No notable dental hx.    Pulmonary neg pulmonary ROS,  breath sounds clear to auscultation  Pulmonary exam normal       Cardiovascular negative cardio ROS  Rhythm:Regular Rate:Normal     Neuro/Psych PSYCHIATRIC DISORDERS Anxiety Depression negative neurological ROS  negative psych ROS   GI/Hepatic negative GI ROS, Neg liver ROS,   Endo/Other  negative endocrine ROSHypothyroidism   Renal/GU negative Renal ROS  negative genitourinary   Musculoskeletal negative musculoskeletal ROS (+)   Abdominal   Peds negative pediatric ROS (+)  Hematology negative hematology ROS (+)   Anesthesia Other Findings Lower dental implants  Reproductive/Obstetrics negative OB ROS                          Anesthesia Physical  Anesthesia Plan  ASA: III  Anesthesia Plan: General   Post-op Pain Management:    Induction: Intravenous  Airway Management Planned: LMA  Additional Equipment:   Intra-op Plan:   Post-operative Plan:   Informed Consent: I have reviewed the patients History and Physical, chart, labs and discussed the procedure including the risks, benefits and alternatives for the proposed anesthesia with the patient or authorized representative who has indicated his/her understanding and acceptance.   Dental advisory given  Plan Discussed with: CRNA  Anesthesia Plan Comments:        Anesthesia Quick Evaluation                                   Anesthesia Evaluation  Patient identified by MRN, date of birth, ID band Patient awake    Reviewed: Allergy & Precautions, H&P , NPO status , Patient's Chart, lab work & pertinent test  results  Airway Mallampati: II TM Distance: >3 FB Neck ROM: Full    Dental No notable dental hx.    Pulmonary neg pulmonary ROS,  breath sounds clear to auscultation  Pulmonary exam normal       Cardiovascular negative cardio ROS  Rhythm:Regular Rate:Normal     Neuro/Psych negative neurological ROS  negative psych ROS   GI/Hepatic negative GI ROS, Neg liver ROS,   Endo/Other  negative endocrine ROSHypothyroidism   Renal/GU negative Renal ROS  negative genitourinary   Musculoskeletal negative musculoskeletal ROS (+)   Abdominal   Peds negative pediatric ROS (+)  Hematology negative hematology ROS (+)   Anesthesia Other Findings Lower dental implants  Reproductive/Obstetrics negative OB ROS                           Anesthesia Physical Anesthesia Plan  ASA: III  Anesthesia Plan: Spinal   Post-op Pain Management:    Induction:   Airway Management Planned: Simple Face Mask  Additional Equipment:   Intra-op Plan:   Post-operative Plan: Extubation in OR  Informed Consent: I have reviewed the patients History and Physical, chart, labs and discussed the procedure including the risks, benefits and alternatives for the proposed anesthesia with the patient or authorized representative who has indicated his/her understanding and acceptance.   Dental advisory given  Plan Discussed with: CRNA  Anesthesia Plan  Comments:         Anesthesia Quick Evaluation

## 2011-10-30 NOTE — Telephone Encounter (Signed)
Cancelled ct abd and pelvis for 11-06-2011 kept ct neck and chest scheduled patient for mri of the amd and pelvis for 11-08-2011 per research nurse will call the patient and confirm

## 2011-10-30 NOTE — Transfer of Care (Signed)
Immediate Anesthesia Transfer of Care Note  Patient: Kelli [name]  Procedure(s) Performed: Procedure(s) (LRB): CYSTOSCOPY WITH STENT PLACEMENT (Left)  Patient Location: PACU  Anesthesia Type: General  Level of Consciousness: sedated and responds to stimulation  Airway & Oxygen Therapy: Patient Spontanous Breathing and Patient connected to nasal cannula oxygen  Post-op Assessment: Report given to PACU RN  Post vital signs: Reviewed and stable  Complications: No apparent anesthesia complications

## 2011-10-31 NOTE — Anesthesia Postprocedure Evaluation (Signed)
  Anesthesia Post-op Note  Patient: [name]  Procedure(s) Performed: Procedure(s) (LRB): CYSTOSCOPY WITH STENT PLACEMENT (Left)  Patient Location: PACU  Anesthesia Type: General  Level of Consciousness: awake and alert   Airway and Oxygen Therapy: Patient Spontanous Breathing  Post-op Pain: mild  Post-op Assessment: Post-op Vital signs reviewed, Patient's Cardiovascular Status Stable, Respiratory Function Stable, Patent Airway and No signs of Nausea or vomiting  Post-op Vital Signs: stable  Complications: No apparent anesthesia complications

## 2011-11-01 NOTE — Progress Notes (Incomplete)
°  Radiation Oncology         [phone] ________________________________  Name: Kelli [name] MRN: [id number]  Date: 10/28/2011  DOB: Sep 11, 1940  End of Treatment Note  Diagnosis:  Metastatic breast cancer with osseous involvement of the left proximal leg  Indication for treatment:  ***       Radiation treatment dates:   10/15/2011-10/28/2011  Site/dose:   ***  Beams/energy:   ***  Narrative: The patient tolerated radiation treatment relatively well.   ***  Plan: The patient has completed radiation treatment. The patient will return to radiation oncology clinic for routine followup in one month. I advised them to call or return sooner if they have any questions or concerns related to their recovery or treatment.  -----------------------------------  Billie Lade, PhD, MD

## 2011-11-02 ENCOUNTER — Encounter: Payer: Self-pay | Admitting: Radiation Oncology

## 2011-11-02 NOTE — Progress Notes (Signed)
  Radiation Oncology         [phone] ________________________________  Name: Kelli [name] MRN: [id number]  Date: 11/02/2011  DOB: 1940/08/11  End of Treatment Note  Diagnosis:   Osseous metastasis to the left femur from metastatic breast cancer     Indication for treatment:  Postop and pain control       Radiation treatment dates:   10/15/2011 through 10/28/2011  Site/dose:   Left femur 3000 cGy in 10 fractions  Beams/energy:   AP PA using 10 MV photons  Narrative: The patient tolerated radiation treatment relatively well.   She did have improvement in her pain in the left upper leg during the last week of her treatment.  Plan: The patient has completed radiation treatment. The patient will return to radiation oncology clinic for routine followup in one month. I advised them to call or return sooner if they have any questions or concerns related to their recovery or treatment.  -----------------------------------  Billie Lade, PhD, MD

## 2011-11-04 ENCOUNTER — Encounter: Payer: Self-pay | Admitting: Oncology

## 2011-11-04 ENCOUNTER — Other Ambulatory Visit: Payer: Self-pay

## 2011-11-04 DIAGNOSIS — M84553A Pathological fracture in neoplastic disease, unspecified femur, initial encounter for fracture: Secondary | ICD-10-CM

## 2011-11-04 MED ORDER — HYDROCODONE-ACETAMINOPHEN 5-325 MG PO TABS: ORAL_TABLET | ORAL | Status: DC

## 2011-11-04 NOTE — Progress Notes (Signed)
DOCUMENTATION  Per phone discussion with patient, with assistance of RN  No known or suspected past hepatitis B or C infection No blood transfusions prior to 1990 Not current or prior IV drug user Not current or prior dialysis patient No household or partner hepatitis B or C Mother does not have hepatitis B No current or prior high risk sexual activity No body piercings or tattoos No history suggestive of hepatitis B   L.Darrold Span, MD

## 2011-11-06 ENCOUNTER — Encounter (HOSPITAL_COMMUNITY)
Admission: RE | Admit: 2011-11-06 | Discharge: 2011-11-06 | Disposition: A | Discharge status: Home / Self Care | Payer: Medicare Other | Source: Ambulatory Visit | Attending: Oncology | Admitting: Oncology

## 2011-11-06 ENCOUNTER — Encounter (HOSPITAL_COMMUNITY): Payer: Self-pay

## 2011-11-06 ENCOUNTER — Other Ambulatory Visit (HOSPITAL_COMMUNITY): Payer: Medicare Other

## 2011-11-06 ENCOUNTER — Other Ambulatory Visit (HOSPITAL_BASED_OUTPATIENT_CLINIC_OR_DEPARTMENT_OTHER): Payer: Medicare Other | Admitting: Lab

## 2011-11-06 ENCOUNTER — Other Ambulatory Visit: Payer: Self-pay | Admitting: *Deleted

## 2011-11-06 ENCOUNTER — Ambulatory Visit: Payer: Medicare Other

## 2011-11-06 ENCOUNTER — Encounter: Payer: Self-pay | Admitting: Oncology

## 2011-11-06 ENCOUNTER — Encounter: Payer: Self-pay | Admitting: *Deleted

## 2011-11-06 ENCOUNTER — Other Ambulatory Visit: Payer: Self-pay

## 2011-11-06 ENCOUNTER — Telehealth: Payer: Self-pay | Admitting: *Deleted

## 2011-11-06 DIAGNOSIS — C50919 Malignant neoplasm of unspecified site of unspecified female breast: Secondary | ICD-10-CM

## 2011-11-06 DIAGNOSIS — C7951 Secondary malignant neoplasm of bone: Secondary | ICD-10-CM

## 2011-11-06 DIAGNOSIS — C787 Secondary malignant neoplasm of liver and intrahepatic bile duct: Secondary | ICD-10-CM | POA: Insufficient documentation

## 2011-11-06 DIAGNOSIS — N390 Urinary tract infection, site not specified: Secondary | ICD-10-CM

## 2011-11-06 DIAGNOSIS — C50419 Malignant neoplasm of upper-outer quadrant of unspecified female breast: Secondary | ICD-10-CM

## 2011-11-06 LAB — CBC WITH DIFFERENTIAL/PLATELET
BASO%: 0.4 % (ref 0.0–2.0)
EOS%: 1 % (ref 0.0–7.0)
HCT: 28.9 % — ABNORMAL LOW (ref 34.8–46.6)
MCH: 30.9 pg (ref 25.1–34.0)
MCHC: 32.5 g/dL (ref 31.5–36.0)
NEUT%: 76.1 % (ref 38.4–76.8)
RDW: 19 % — ABNORMAL HIGH (ref 11.2–14.5)
lymph#: 1.2 10*3/uL (ref 0.9–3.3)

## 2011-11-06 LAB — URINALYSIS, MICROSCOPIC - CHCC
Bilirubin (Urine): NEGATIVE
Ketones: NEGATIVE mg/dL
Specific Gravity, Urine: 1.015 (ref 1.003–1.035)
pH: 6 (ref 4.6–8.0)

## 2011-11-06 MED ORDER — IOHEXOL 300 MG/ML  SOLN
100.0000 mL | Freq: Once | INTRAMUSCULAR | Status: AC | PRN
  Administered 2011-11-06: 100 mL via INTRAVENOUS

## 2011-11-06 MED ORDER — TECHNETIUM TC 99M MEDRONATE IV KIT
24.0000 | PACK | Freq: Once | INTRAVENOUS | Status: AC | PRN
  Administered 2011-11-06: 24 via INTRAVENOUS

## 2011-11-06 NOTE — Progress Notes (Unsigned)
11/06/11 at 11:35am- The pt was into the cancer center this am for her Bolero 4 screening assessments.  The pt confirmed that she was fasting at least 9 hours before her blood draw this am.  The pt weight and height were then measured (without shoes).  Then, the patient's vital signs were taken after the pt had been sitting for 5 minutes.  The pt's back was supported and both her feet were on the floor.  Her BP was measured 3 times using an automatic validated device.

## 2011-11-06 NOTE — Progress Notes (Signed)
Patient and her husband stop by my office this afternoon,to discuss the bills that his wife has received.I told them to call the billing Department and they would set up payments for them,and her husband said that its not the bills from the cancer center it is the bills from when she had surgery in the hospital.

## 2011-11-06 NOTE — Telephone Encounter (Signed)
Received u/a results on pt & discussed with Lowella Bandy Eldreth/Research & Jerelyn Charles NP & urine culture ordered.  Report of u/a faxed to Dr Retta Diones.

## 2011-11-07 ENCOUNTER — Ambulatory Visit (HOSPITAL_COMMUNITY)
Admission: RE | Admit: 2011-11-07 | Discharge: 2011-11-07 | Disposition: A | Discharge status: Home / Self Care | Payer: Medicare Other | Source: Ambulatory Visit | Attending: Oncology | Admitting: Oncology

## 2011-11-07 ENCOUNTER — Ambulatory Visit (HOSPITAL_COMMUNITY): Admission: RE | Admit: 2011-11-07 | Payer: Medicare Other | Source: Ambulatory Visit

## 2011-11-07 DIAGNOSIS — C7952 Secondary malignant neoplasm of bone marrow: Secondary | ICD-10-CM | POA: Insufficient documentation

## 2011-11-07 DIAGNOSIS — N1330 Unspecified hydronephrosis: Secondary | ICD-10-CM | POA: Insufficient documentation

## 2011-11-07 DIAGNOSIS — C7951 Secondary malignant neoplasm of bone: Secondary | ICD-10-CM | POA: Insufficient documentation

## 2011-11-07 DIAGNOSIS — R599 Enlarged lymph nodes, unspecified: Secondary | ICD-10-CM | POA: Insufficient documentation

## 2011-11-07 DIAGNOSIS — C787 Secondary malignant neoplasm of liver and intrahepatic bile duct: Secondary | ICD-10-CM | POA: Insufficient documentation

## 2011-11-07 DIAGNOSIS — C50919 Malignant neoplasm of unspecified site of unspecified female breast: Secondary | ICD-10-CM | POA: Insufficient documentation

## 2011-11-07 LAB — LIPID PANEL
Cholesterol: 171 mg/dL (ref 0–200)
HDL: 53 mg/dL (ref >39)
Total CHOL/HDL Ratio: 3.2 Ratio
Triglycerides: 137 mg/dL (ref <150)

## 2011-11-07 LAB — COMPREHENSIVE METABOLIC PANEL
ALT: 14 U/L (ref 0–35)
AST: 33 U/L (ref 0–37)
Calcium: 8.7 mg/dL (ref 8.4–10.5)
Chloride: 102 mEq/L (ref 96–112)
Creatinine, Ser: 0.65 mg/dL (ref 0.50–1.10)

## 2011-11-07 LAB — URINE CULTURE

## 2011-11-07 LAB — PROTHROMBIN TIME: INR: 1.28 (ref <1.50)

## 2011-11-07 MED ORDER — GADOBENATE DIMEGLUMINE 529 MG/ML IV SOLN
15.0000 mL | Freq: Once | INTRAVENOUS | Status: AC
  Administered 2011-11-07: 15 mL via INTRAVENOUS

## 2011-11-08 ENCOUNTER — Other Ambulatory Visit (HOSPITAL_COMMUNITY): Payer: Medicare Other

## 2011-11-10 ENCOUNTER — Other Ambulatory Visit: Payer: Self-pay | Admitting: *Deleted

## 2011-11-10 DIAGNOSIS — C7951 Secondary malignant neoplasm of bone: Secondary | ICD-10-CM

## 2011-11-11 ENCOUNTER — Ambulatory Visit: Payer: Medicare Other | Admitting: Oncology

## 2011-11-11 ENCOUNTER — Other Ambulatory Visit: Payer: Medicare Other | Admitting: Lab

## 2011-11-12 ENCOUNTER — Other Ambulatory Visit: Payer: Self-pay | Admitting: *Deleted

## 2011-11-12 ENCOUNTER — Encounter: Payer: Self-pay | Admitting: Oncology

## 2011-11-12 ENCOUNTER — Ambulatory Visit (HOSPITAL_BASED_OUTPATIENT_CLINIC_OR_DEPARTMENT_OTHER): Payer: Medicare Other | Admitting: Oncology

## 2011-11-12 ENCOUNTER — Encounter: Payer: Self-pay | Admitting: *Deleted

## 2011-11-12 ENCOUNTER — Telehealth: Payer: Self-pay | Admitting: *Deleted

## 2011-11-12 ENCOUNTER — Other Ambulatory Visit: Payer: Medicare Other | Admitting: Lab

## 2011-11-12 VITALS — BP 113/65 | HR 98 | Temp 99.1°F | Resp 18 | Ht 62.0 in | Wt 137.3 lb

## 2011-11-12 DIAGNOSIS — C787 Secondary malignant neoplasm of liver and intrahepatic bile duct: Secondary | ICD-10-CM

## 2011-11-12 DIAGNOSIS — C50912 Malignant neoplasm of unspecified site of left female breast: Secondary | ICD-10-CM

## 2011-11-12 DIAGNOSIS — C7952 Secondary malignant neoplasm of bone marrow: Secondary | ICD-10-CM

## 2011-11-12 DIAGNOSIS — C7951 Secondary malignant neoplasm of bone: Secondary | ICD-10-CM

## 2011-11-12 DIAGNOSIS — C50919 Malignant neoplasm of unspecified site of unspecified female breast: Secondary | ICD-10-CM

## 2011-11-12 DIAGNOSIS — C801 Malignant (primary) neoplasm, unspecified: Secondary | ICD-10-CM

## 2011-11-12 DIAGNOSIS — M84553A Pathological fracture in neoplastic disease, unspecified femur, initial encounter for fracture: Secondary | ICD-10-CM

## 2011-11-12 DIAGNOSIS — M84453A Pathological fracture, unspecified femur, initial encounter for fracture: Secondary | ICD-10-CM

## 2011-11-12 DIAGNOSIS — C779 Secondary and unspecified malignant neoplasm of lymph node, unspecified: Secondary | ICD-10-CM

## 2011-11-12 DIAGNOSIS — C50419 Malignant neoplasm of upper-outer quadrant of unspecified female breast: Secondary | ICD-10-CM

## 2011-11-12 LAB — RESEARCH LABS

## 2011-11-12 MED ORDER — ONDANSETRON HCL 8 MG PO TABS: 8.0000 mg | ORAL_TABLET | Freq: Three times a day (TID) | ORAL | Status: DC | PRN

## 2011-11-12 MED ORDER — HYDROCODONE-ACETAMINOPHEN 5-325 MG PO TABS: ORAL_TABLET | ORAL | Status: DC

## 2011-11-12 MED ORDER — INV-EVEROLIMUS (RAD0001) 5MG TABLET NOVARTIS CRAD001Y24135: 2.0000 | ORAL_TABLET | Freq: Every day | ORAL | Status: DC

## 2011-11-12 MED ORDER — LETROZOLE 2.5 MG PO TABS: 2.5000 mg | ORAL_TABLET | Freq: Every day | ORAL | Status: DC

## 2011-11-12 MED ORDER — HYDROMORPHONE HCL 2 MG PO TABS: 2.0000 mg | ORAL_TABLET | ORAL | Status: DC | PRN

## 2011-11-12 NOTE — Progress Notes (Signed)
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 She also continues zometa, due third cycle on 11-19-11, and has had nailing of pathologic left hip fracture 09-24-11 with subsequent RT completed 10-28-11. She had restaging scans per protocol done 8-15 and 11-07-11. She has also met with Research RN, signed consent and had protocol labs drawn. We have discussed study again now as well as  possible side effects including pain flare, stomatitis, diarrhea, nausea and drop in blood counts.  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 She was treated with lumpectomy and 18 node axillary node dissection, adjuvant adriamycin/cytoxan followed by [facility] chemotherapy, local radiation, tamoxifen from April 2003 thru Dec 2003 then arimidex from Dec 2004 thru June 2011. The arimidex had been continued for that long duration due to high risk features, stopped as there had been no evidence of disease and with known osteoporosis. She was clinically stable and labs were normal other than very slight decrease in hemoglobin when I saw her in Jan 2013. She developed acute pain in left hip in mid June which did not respond to pain medication by rheumatologist, then MRI hip 09-20-11 showed metastatic disease with pathologic fracture of femoral neck. She was admitted 7-3 thru 09-26-11, with nailing by Dr Victorino Dike on 09-24-11. Pathology from the surgery confirmed metastatic breast carcinoma, ER+ 100%, PR negative and HER 2 negative by [facility]. She received radiation 3000 cGy in 10 fractions to left femur 7-24 thru 10-28-11 by Dr Roselind Messier. Bolero 4 study  requires waiting 2 weeks beyond completion of this RT. She had zometa last 10-22-11.  Patient remains fatigued and has discomfort across upper abdomen and mid back ongoing. She is using hydrocodone 5/325 ~ twice in 24 hours, which does help the discomfort. She has not used the hydrocodone 7.5/325 or the dilaudid. She is not as aware of the right lateral chest pain or LE/ hip pain. She denies HA or other neurologic symptoms. She has had antibiotics for UTI twice by Dr Retta Diones (? Keflex and cipro) thru 8-20 AM, last dose held due to nonpuritic rash on abdomen and thighs which is better today. She denies any bladder spasm, dysuria or hematuria. She has had unpleasant taste, without mucositis symptoms. She denies nausea. She is sleeping poorly, which husband believes is related to 3 dogs in bed. She is able to walk up stairs without difficulty. She notices shortness of breath with exertion such as showering, no cough, no other chest pain. She has not seen bleeding. No swelling LE. Bowels are moving about every other day without diarrhea. Remainder of 10 point Review of Systems negative.  ECOG performance status 2 Objective:  Vital signs in last 24 hours:  BP 113/65  Pulse 98  Temp 99.1 F (37.3 C) (Oral)  Resp 18  Ht 5\' 2"  (1.575 m)  Wt 137 lb 4.8 oz (62.279 kg)  BMI 25.11 kg/m2 This weight is stable. Using wheelchair for length of office hall, but able to get out of WC and on/ off exam table without assistance, moving easily with this. Respirations not labored RA with this exertion. Fully alert and appropriate, not in  obvious discomfort.   HEENT:PERRLA, sclera clear, anicteric, oropharynx clear, no lesions and neck supple with midline trachea Dentures in.No alopecia. LymphaticsCervical, supraclavicular, and axillary nodes normal. Resp: clear to auscultation bilaterally and normal percussion bilaterally Cardio: regular rate and rhythm, clear heart sounds. GI: soft, nontender, normal bowel  sounds. Liver edge just below right costal margin by scratch percussion, no rub. Not distended. No epigastric tenderness.No suprapubic tenderness. Extremities: extremities normal, atraumatic, no cyanosis or edema upper or lower extremities. Feet warm, peripheral pulses intact. Surgical scar left lateral hip well-healed. Neuro: CN, motor, sensory, cerebellar nonfocal. Breast exam not repeated today. No central catheter Skin with very minimal, nonpalpable slightly erythematous rash over upper and midabdomen bilaterally and very vague on lower back, none on thighs, no vesicles --  Improved from last pm per pt and husband. No bruises or petechiae.  Lab Results:  Results for orders placed in visit on 11/12/11  RESEARCH LABS      Component Value Range   Research Labs Collected.       Studies/Results:    MRI ABDOMEN AND PELVIS WITHOUT AND WITH CONTRAST  11-07-11  Comparison: CT chest abdomen pelvis dated 09/24/2011. PET CT  dated 10/10/2011.  MRI ABDOMEN  Findings: Numerous hepatic metastases, approximately 20-25 in  number. Dominant lesions include:  --1.5 x 1.6 cm lesion, anterior hepatic dome (series 1502/image 16)  --4.3 x 5.3 cm lesion, medial segment left hepatic lobe (series  1502/image 27)  --2.0 x 2.0 cm lesion, posterior segment right hepatic lobe (series  1502/image 38)  --2.6 x 3.0 cm lesion, posterior aspect of the left hepatic lobe  (series 1502/image 42)  --3.2 x 3.5 cm lesion, inferior right hepatic lobe (series  1502/image 54)  Signal loss in the liver, spleen, and adrenal glands on in-phase  imaging, reflecting iron deposition.  1.5 cm cystic lesion in the medial spleen (series 13/image 16),  likely benign. Pancreas and adrenal glands are unremarkable.  Status post cholecystectomy. No intrahepatic or extrahepatic  ductal dilatation. Moderate left hydronephrosis, unchanged.  Upper abdominal/retroperitoneal lymphadenopathy measuring up to 1.2  cm short axis (series  1502/image 58).  No abdominal ascites.  Diffuse/widespread osseous metastases involving the spine, most  prominently at L1 (series 17/image 26), and at least one right  lateral rib (series 1502/image 22).  IMPRESSION:  Numerous hepatic metastases, as described above, measuring up to  5.3 cm.  Upper abdominal/retroperitoneal lymphadenopathy measuring up to 1.2  cm short axis.  Widespread osseous metastases, as described above.  Moderate left hydronephrosis, unchanged.  MRI PELVIS  Findings: Uterus is notable for a suspected 11 mm anterior fundal  fibroid (series 5/image 16). No adnexal masses.  Small volume pelvic ascites.  Bladder unremarkable.  No suspicious pelvic lymphadenopathy.  Status post ORIF of the proximal left femur.  Diffuse/widespread osseous metastases in the lower lumbar spine,  pelvis, and bilateral proximal femurs. Index lesion in the right  proximal femur measures 1.4 x 1.6 cm (series 5/image 24).  IMPRESSION:  Widespread osseous metastases, as described above.  Original Report Authenticated By: Charline Bills, M.D.    NUCLEAR MEDICINE WHOLE BODY BONE SCINTIGRAPHY  11-06-11 Technique: Whole body anterior and posterior images were obtained   Comparison: Nuclear medicine PET imaging 10/09/2011, MRI pelvis  09/19/2011,and whole body bone scan 12/20/2003  Findings: There is widespread bony metastatic disease. The proximal  three quarters of the left femur is diffusely hypermetabolic. It  is noted that the patient has had intramedullary rod and pin  fixation of the  left femur, for a known pathologic fracture of the  proximal left femur, as described on MRI of the left hip in June  2013. There are multiple foci of abnormal uptake in the skull,  multiple bilateral ribs (right greater than left), multiple  cervical, thoracic and lumbar spine vertebral bodies, and the bony  pelvis. Uptake within the bony pelvis is most prominent about the  sacroiliac joints  bilaterally, and the anterior iliac bones  bilaterally.  There is a small focal area of uptake in the proximal right femur  in the intertrochanteric region, and in the right femoral head.  A few tiny foci of increased uptake in the proximal left tibia are  noted.  IMPRESSION:  Widespread bony metastatic disease as described above, affecting  the skull, spine, bony pelvis, extensively in the proximal left  femur, probable small foci in the proximal right femur and in the  proximal left tibia.     CT HEAD WITHOUT AND WITH CONTRAST 11-06-11 Technique: Contiguous axial images were obtained from the base of  the skull through the vertex without and with intravenous contrast.  Contrast: OMNIPAQUE IOHEXOL 300 MG/ML SOLN  In conjunction with CT(s) of the chest which is(are) reported  separately.  Comparison: PET CT 10/09/2011.  Findings: Visualized paranasal sinuses and mastoids are clear.  Scattered lucent areas in the calvarium with indistinct margins are  suspicious for metastatic disease to bone, including to the right  parietal bone lesions on series 3 images 19 and 21. There is a  similar small lucent lesion in the lower clivus which is suspicious  (series 3 image 1). There are superimposed benign appearing skull  lucencies, including small venous lakes suspected at the vertex.  Occasional benign calcified scalp/dermis lesions are noted. No  scalp soft tissue masses associated with the suspicious bony  lesion. Visualized orbit soft tissues are within normal limits.  Mild Calcified atherosclerosis at the skull base. No  ventriculomegaly. No acute intracranial hemorrhage identified. No  midline shift, mass effect, or evidence of mass lesion. No  evidence of cortically based acute infarction identified. Mild for  age occasional white matter hypodensity. The No abnormal  enhancement identified. Major intracranial vascular structures  appear to be normally enhancing.  IMPRESSION:    1. Findings suspicious for scattered bony metastatic disease in  the calvarium and skull base.  2. No acute or metastatic intracranial finding.  3. Chest CT reported separately.  Original Report Authenticated By: Ulla Potash III, M.     *  CT CHEST WITH CONTRAST 11-06-11 Technique: Multidetector CT imaging of the chest was performed  following the standard protocol during bolus administration of  intravenous contrast.  Contrast: OMNIPAQUE IOHEXOL 300 MG/ML SOLN  Comparison: None.  RECIST 1.0 FINDINGS:  No definite suspicious appearing pulmonary nodules.  Multiple large liver lesions, largest of which include: A) 5.1 cm  (image 52 of series 5) lesion in segment 4A, increased from 4.3 cm  on prior examination (image 53 of series 2 on prior studies  09/24/2011), and B) 3.0 cm (image 60 of series 5), increased from  2.2 cm on prior examination (image 60 of series 2 of the prior  study 09/24/2011).  Findings:  Mediastinum: Heart size is normal. There is no significant  pericardial fluid, thickening or pericardial calcification. No  pathologically enlarged mediastinal or hilar lymph nodes. The  esophagus is unremarkable in appearance.  Lungs/Pleura: Pleuroparenchymal thickening and architectural  distortion in the apex of the left upper lobe  may represent post  infectious scarring or post treatment related changes secondary to  prior radiation therapy. There are similar findings to a lesser  degree along the anterior aspect of the left upper lobe, and in the  lateral aspect of the right apex. No definite suspicious appearing  pulmonary nodules or masses. Linear opacities in the right lower  lobe are most compatible with areas of scarring. No acute  consolidative airspace disease. Trace right-sided pleural effusion  layering dependently.  Upper Abdomen: Multiple liver lesions (see RECIST specific  measurements above), all of which appear larger than the prior  examinations,  concerning for progressive metastatic disease. The  largest single lesion is seen in segment 4A of the liver measuring  5.1 x 4.6 cm (image 52 of series 5). Small low attenuation lesion  in the medial aspect of the spleen is unchanged, and likely to  represent a cyst.  Musculoskeletal: There is mild expansion and irregular lucency and  sclerosis in the posterior aspect of the right fifth rib,  concerning for metastatic lesion. Old healed fracture of the  posterolateral aspect of the left sixth rib is again noted. In the  posterolateral aspects of the right eighth and ninth ribs there are  healing fractures which are likely pathologic. Multi focal areas  of irregular lucency and sclerosis are also seen scattered  throughout the thoracic spine, concerning for multifocal  metastases. As with the prior examination and there are  compression fractures of T5, T6, T7 and T8 with approximately 25%  loss of vertebral body height T5, T7 and T8 and 10% loss of height  at T6. These are likely pathologic.  IMPRESSION:  1. Findings concerning for progressive metastatic disease to the  liver and bones, with RECIST specific measurements, as detailed  above.  2. No definite metastatic disease is identified within the lungs  at this time.  3. Trace right-sided pleural effusion layering dependently.   Scans as above discussed with patient and husband now. CT chest mentions compression fractures of T 5-6-7-8 "as with prior examination" tho only T8 called on reports of July scans. Bony lesions are mixed lytic and sclerotic.  Per protocol she will be due repeat scans at start of cycle 3.  Medications: I have reviewed the patient's current medications. I have given her written and oral instructions to use hydrocodone/APAP 5/325 one every 6 hours now. If pain is not well controlled with this dose, she is to increase to hydrocodone 7.5/325 every 6 hours. If pain still not controlled, she is to take dilaudid and  call. She is given prescriptions today for dilaudid 2 mg and hydrocodone 5/325. She will use ativan or new prescription for zofran if any nausea. She will use imodium if diarrhea and call if so. She will begin biotene mouthwash 3x daily and/or baking soda in water mouthwash, but will DC alcohol containing OTC mouthwashes. She will not take last dose of oral cipro due to question of rash.  Patient has taken first doses of Femara and everolimus at office now, for Cycle 1 Day 1.  Assessment/Plan: 1. Metastatic breast cancer ER positive Her2 negative involving bone, liver and soft tissues including near left ureter and left parapharyngeal space: diagnosis made 09-24-11 at nailing of pathologic left femur fracture, this 11 years after primary diagnosis of T1 13 node positive left breast cancer and 2 years after stopping arimidex. Now on letrozole + everolimus on Bolero 4 study. Will continue zometa q 4 weeks, due 11-19-11. I will  see her with repeat CBC on 8-28 or sooner if needed. Increase pain medication as above now, as she is not totally comfortable and I am concerned about pain flare also.  Note companion study if stomatitis with aphthous ulcerations develops, randomizing then to decadron mouthwash vs usual interventions. She will need to be seen within 24 hours to go onto companion study, which may not be possible if occurs on weekend, but would be an option if timing allows.  2.anemia: seems related to metastatic involvement of bone. She was also iron deficient by labs 10-06-11 and had full dose feraheme 10-10-11, hemoccult negative. 3.hypothyroid on replacement 4.left ureteral stent placed by Dr Retta Diones 10-30-11, which she is tolerating well 5.recent antibiotics for asymptomatic UTI by urology. Possible rash to cipro yesterday, already improving. 6.osteoporosis by previous bone density scans. Now on IV bisphosphonate + calcium/D 7.history anxiety and depression, long-standing and chronic insomnia.  Ativan is useful in her and likely will be good if any nausea. 8. Osteoarthritis 9. DNR patient's request, but full support to that point.  Patient and husband have had their questions answered to their satisfaction, follow our discussion well and are in agreement with plan. They know that they can call at any time if needed.  Time spent 45 min directly with patient, including > 50% in discussion. Additional time coordinating care > 60 min. Starnisha Batrez P, MD   11/12/2011, 11:05 AM

## 2011-11-12 NOTE — Progress Notes (Signed)
11/12/11 at 11:52am - [employer] Bolero 4 - cycle 1, day 1 study notes  The pt was into the cancer center this am to begin her study drugs for cycle 1, day 1.  The pt confirmed that she was fasting ( over 9 hours) upon her arrival this am.  The pt's biomarkers were obtained predose.  Day 1 labs were not required since her screening labs were within 7 days of her Day 1 treatment.  The pt was seen and examined today by Dr. Darrold Span.  The pt's weight  (shoeless) and sitting vitals (BP x 3) were taken.  The pt ate a sandwich, and then she took her study drugs on-site per protocol compliance.  The nurse instructed the pt to take 2 everolimus tablets (10mg  total dose) and 1 letrozole tablet (total dose 2.5mg ) at the same time every day consistently with food (pt's preference).  The pt demonstrated this by taking both of her study drugs in the clinic.  The pt was encouraged to record her daily doses on a calendar.  The pt was also told that she must bring in the everolimus drug kit (including all blister packs) at her next on-study visit on 12/10/11.  The pt verbalized understanding.  The pt was informed to call our office immediately if she develops any mouth redness or irritation.  The pt was told that she would need to be evaluated within 24 hours at our office regarding her mouth sores.  The pt understood that she would also have to complete stomatitis questionnaires for the duration of her stomatitis treatment.  Dr. Darrold Span also went over in detail with the pt what to do if she develops any mouth sores or other problems.  The pt was also strongly encouraged to consult our office before she starts any new medications (prescribed or OTC).  The nurse explained that everolimus may interact with certain medications and we want to avoid any medication interactions.  Dr. Darrold Span prescribed Zofran prn for any on-study nausea.  She also advised the pt to buy some Immodium just in case she develops any diarrhea.  Dr. Darrold Span also  advised the pt to start Biotene mouth rinses for good oral hygiene.  The pt's current medications were reviewed.  The research nurse also obtained start dates of some of the pt's medications and her prior medical problems.  The pt was not able to give the research nurse exact dates on some of her medications and health problems.  However, she was able to provide a month/year for most of the conditions.  The pt's ECOG status remains = 2.  The pt provided the start dates to the following medical problems:  Osteoporosis (11/03/02 - active problem- takes calcium+ vitamin D- grade 1), osteoarthritis-("10 years ago"- August 2003-active problem-grade 1), insomnia ("10 years ago"- August 2003- takes Ambien prn-grade 2), anxiety and depression (July 2002 -active problem-takes Prozac and Ativan prn- grade 2), anemia (07/09/11 - takes iron infusions- grade 1), left hydronephrosis (10/09/11- stent placement 10/29/11- active problem - grade 2), and hypothyroidism (January 1980 takes synthroid-active problem- grade 2).  The pt stated she is taking the following medications as of the last month:  Prozac, Synthroid, Ativan, Senokot S, vitamin B-12,  Ambien, and Norco/Vicodin.  The pt provided the following the medication start dates:  Synthroid (January 1980), Sennokot S (July 2013), and vitamin B-12 (July 2013).  The pt will be evaluated next week for an unscheduled visit per the patient's physician.  The pt knows to call  for any question or concerns.     11/12/11 at 4pm- Dr. Darrold Span said she spoke to the radiologist, and he confirmed that the pt has both lytic and mixed lesions (both are > than 3).

## 2011-11-12 NOTE — Telephone Encounter (Signed)
Called [facility]. Confirmed Zofran order e-prescribed. Asked pharmacist to remind patient to pick up imodium and biotene mouthwash.

## 2011-11-12 NOTE — Patient Instructions (Signed)
Use hydrocodone 1 tablet every 6 hours. If you are comfortable with the lower dose white pill (5/325) that is fine, or the slightly higher dose orange pill (7.5/325) would also be fine.    If you are not comfortable using orange hydrocodone, take dilaudid (hydromorphone) 2 mg instead of hydrocodone. Call MD if you need to use dilaudid (hydromorphone).  If you have nausea, you can use the nerve pill ativan (lorazepam) 1/2 or 1 tablet every 4-6 hours, swallow or put under tongue. This medicine will make you drowsy. Alternatively you can use the new prescription for zofran (ondansetron) 8 mg every 8 hrs for nausea -- the zofran (ondnasetron lasts longer and does not make you drowsy, so may be better during the day.  Start Biotene mouthwash 3x daily.  Do not use regular mouthwashes like Scope as these have alcohol. Baking soda 1 tsp in 8 oz water is also a very good mouthwash. If you get mouth sores, call MD. (If mouth sores begin Sat PM or Sun and are not very bothersome, call us first thing Mon AM. If they are bad, ok to call on call dr Arvil Chaco PM or Sun)  Try [employer] Breakfast (chocolate!), which is as much good nutrition as Boost or Ensure.  If you have diarrhea, take imodium 2 tablets (4 mg) then 1 tablet (2 mg) every 2 hours until diarrhea stops. Also call MD please.  No puppies on your bed for next 2 weeks.  :(

## 2011-11-18 ENCOUNTER — Telehealth: Payer: Self-pay | Admitting: Oncology

## 2011-11-18 NOTE — Telephone Encounter (Signed)
lmonvm for pt w/a reminder for appt 8/28.

## 2011-11-19 ENCOUNTER — Encounter: Payer: Self-pay | Admitting: *Deleted

## 2011-11-19 ENCOUNTER — Other Ambulatory Visit (HOSPITAL_BASED_OUTPATIENT_CLINIC_OR_DEPARTMENT_OTHER): Payer: Medicare Other | Admitting: Lab

## 2011-11-19 ENCOUNTER — Ambulatory Visit (HOSPITAL_BASED_OUTPATIENT_CLINIC_OR_DEPARTMENT_OTHER): Payer: Medicare Other | Admitting: Oncology

## 2011-11-19 ENCOUNTER — Telehealth: Payer: Self-pay

## 2011-11-19 ENCOUNTER — Ambulatory Visit (HOSPITAL_BASED_OUTPATIENT_CLINIC_OR_DEPARTMENT_OTHER): Payer: Medicare Other

## 2011-11-19 ENCOUNTER — Other Ambulatory Visit: Payer: Self-pay | Admitting: *Deleted

## 2011-11-19 VITALS — BP 113/72 | HR 95 | Temp 98.9°F | Resp 18 | Ht 64.0 in | Wt 136.2 lb

## 2011-11-19 DIAGNOSIS — C50419 Malignant neoplasm of upper-outer quadrant of unspecified female breast: Secondary | ICD-10-CM

## 2011-11-19 DIAGNOSIS — M949 Disorder of cartilage, unspecified: Secondary | ICD-10-CM

## 2011-11-19 DIAGNOSIS — C50919 Malignant neoplasm of unspecified site of unspecified female breast: Secondary | ICD-10-CM

## 2011-11-19 DIAGNOSIS — D649 Anemia, unspecified: Secondary | ICD-10-CM

## 2011-11-19 DIAGNOSIS — C787 Secondary malignant neoplasm of liver and intrahepatic bile duct: Secondary | ICD-10-CM

## 2011-11-19 DIAGNOSIS — C7952 Secondary malignant neoplasm of bone marrow: Secondary | ICD-10-CM

## 2011-11-19 DIAGNOSIS — C7951 Secondary malignant neoplasm of bone: Secondary | ICD-10-CM

## 2011-11-19 DIAGNOSIS — M899 Disorder of bone, unspecified: Secondary | ICD-10-CM

## 2011-11-19 DIAGNOSIS — R040 Epistaxis: Secondary | ICD-10-CM

## 2011-11-19 DIAGNOSIS — N390 Urinary tract infection, site not specified: Secondary | ICD-10-CM

## 2011-11-19 DIAGNOSIS — G4700 Insomnia, unspecified: Secondary | ICD-10-CM

## 2011-11-19 LAB — CBC WITH DIFFERENTIAL/PLATELET
BASO%: 0.2 % (ref 0.0–2.0)
Basophils Absolute: 0 10*3/uL (ref 0.0–0.1)
HCT: 31.2 % — ABNORMAL LOW (ref 34.8–46.6)
HGB: 9.9 g/dL — ABNORMAL LOW (ref 11.6–15.9)
LYMPH%: 28.2 % (ref 14.0–49.7)
MCHC: 31.7 g/dL (ref 31.5–36.0)
MONO#: 0.3 10*3/uL (ref 0.1–0.9)
NEUT%: 64.5 % (ref 38.4–76.8)
Platelets: 201 10*3/uL (ref 145–400)
WBC: 6.2 10*3/uL (ref 3.9–10.3)
lymph#: 1.8 10*3/uL (ref 0.9–3.3)

## 2011-11-19 MED ORDER — ZOLPIDEM TARTRATE 5 MG PO TABS: 5.0000 mg | ORAL_TABLET | Freq: Every evening | ORAL | Status: DC | PRN

## 2011-11-19 MED ORDER — VITAMIN K 100 MCG PO TABS: 100.0000 ug | ORAL_TABLET | Freq: Every day | ORAL | Status: DC

## 2011-11-19 MED ORDER — PHYTONADIONE 5 MG PO TABS
10.0000 mg | ORAL_TABLET | Freq: Once | ORAL | Status: AC
  Administered 2011-11-19: 10 mg via ORAL
  Filled 2011-11-19: qty 2

## 2011-11-19 MED ORDER — ZOLEDRONIC ACID 4 MG/100ML IV SOLN
4.0000 mg | Freq: Once | INTRAVENOUS | Status: AC
  Administered 2011-11-19: 4 mg via INTRAVENOUS
  Filled 2011-11-19: qty 100

## 2011-11-19 NOTE — Patient Instructions (Addendum)
Patient discharged home without complaints.

## 2011-11-19 NOTE — Progress Notes (Signed)
11/19/11 at 2:16pm - Bolero 4 - unscheduled visit (Day 8) - The pt was into the cancer center this afternoon for follow up.  The md wanted to see the pt in 1 week to check on her status and adverse events.  The pt's cbc/diff was reviewed by Dr. Darrold Span.  The pt has developed a mild nosebleed for the past 3 days (started 11/17/11 per pt report).  She also reports that she is "not feeling well".  She reports mild nausea and feels that she is "swallowing blood". The pt was seen and examined by Dr. Darrold Span this afternoon.  The pt confirmed that she is taking her study drugs as prescribed.  Dr. Darrold Span felt that the epistaxis is related to the everolimus.  She said the bleeding has almost stopped.  The pt currently has an ice pack on her nose.  Dr. Darrold Span felt that a medical intervention is not required at present.  Therefore, the epistaxis is a grade 1 toxicity, and the pt will continue her prescribed study drugs.  The pt is getting her Zometa now in the infusion room.   11/19/11 at 4:42pm - The pt's nose is continuing to bleed.  The pt has packing in her nose now.  She has been advised to go to the [facility] ED for an ENT consultation.  Epistaxis -grade 2.  The pt was advised to stop her everolimus study drug until further directed to resume.  The nurse will follow up with the pt tomorrow to assess her toxicity.    11/20/11 at 9:25am- The research nurse read that the pt refused to go to the ED for further evaluation last evening.  Dr. Darrold Span called and spoke to her nurse about the pt.  The MD wanted to hold the pt's study drug, everolimus, today since the pt's nosebleed was still requiring intervention (grade 2).  The research nurse called the pt to check on her status.  The pt's husband stated his wife was still sleeping.  He said she still has the nasal packing in her nose.  The nurse advised the pt's husband to call Dr. Precious Reel nurse if the bleeding were to resume.  The pt will be directed to go to the ED again  for an ENT consultation.  The pt's husband was advised to let the pt take her Femara/Letrozole dose today as usual. Rn will call the pt tomorrow to check on her status.    11/21/11 at 9:24am - The research nurse spoke to Dr. Darrold Span this am about the pt and her study drug, everolimus.  As of 11/20/11, the pt had no reported nasal bleeding.  Therefore, the pt's toxicity was now a grade 1 and per protocol, the pt is to resume her study drug as the same dose.  Dr.  Darrold Span said if the pt has not had any recurrent bleeding then the research nurse needed to advise the pt to resume her study drug, everolimus, today at the same dose.  Dr. Darrold Span also stated if the pt were to have a continuous nosebleed again then the pt should go straight to either the [facility] ED or the [facility] ED for evaluation by an ENT physician.  Dr. Darrold Span also encouraged the pt to use the neo-synephrine soaked gauze if she develops a mild nosebleed.   The research nurse called the pt and spoke to her husband, Dajanae Brophy.  He confirmed that the pt did not have any episodes of nasal bleeding yesterday or so far today.  The research nurse advised the pt's husband to re-start the pt's study drug, everolimus today (2 blister packs/ day) along with her Femara/letrozole (1 tab/daily).  The pt's husband said they could only afford 3 days of vitamin K.  The nurse also reviewed with the pt's husband all of Dr. Precious Reel recommendations if the nosebleeds were to re-start.  The pt's husband verbalized understanding.  The husband knows to call [phone] for any concerns/questions.    11/21/11 at 10:10am- The pt's husband called and stated his wife "blew her nose and the bleeding is back".  The research nurse spoke to Dr.  Darrold Span and the pt was made an appointment with Dr. Ezzard Standing for an ENT consultation today at 12:30pm.  The pt's husband was told that the pt should NOT resume her study drug, everolimus since the nose bleeding is requiring further intervention.  The  everolimus will remain on hold per pending ENT evaluation.   11/25/11 at 10:33am - Dr. Darrold Span advised the research nurse to call the pt and ask her to re-start her everolimus today.  The research nurse called the pt this am, and the pt denied any nose bleeds since 11/21/11.  She said that Dr. Ezzard Standing, the ENT physician, cauterized several vessels in her nose because he could not definitely locate the source of her bleeding.  The pt said that she has been thinking a lot about the study over the past weekend.  She said that she is simply to fearful to resume her everolimus.  She said that she is worried that the "bleeding will come back".  The pt said that she cannot deal with anymore nosebleeds.  The research nurse offered emotional support to the pt.  The pt was reassured that she has the right to stop the research study at anytime.  She said that "her gut is telling her to stop".  Dr. Darrold Span will see the pt next week for a follow up visit.  The pt said that she will make her final decision next week.  Isidoro Donning, the study coordinator, was informed that the pt might discontinue the study.  Dondra Spry will see the pt next week due to the pt's usual research nurse will be on vacation.  Dr. Darrold Span was informed that the pt is not comfortable resuming the everolimus at this time.

## 2011-11-19 NOTE — Telephone Encounter (Signed)
Mr. Travaglini called stating that Ms. Reinertsen did not want to go to the [facility] ED and wait on ENT physician.  They are on their way home. Dr. Darrold Span notified.

## 2011-11-19 NOTE — Progress Notes (Signed)
Patient with nose bleed to right nare. Neo-synephrine applied to gauze and packed in right nare.

## 2011-11-19 NOTE — Progress Notes (Signed)
OFFICE PROGRESS NOTE   11/19/2011   Physicians: J.Hewitt, J.Kinard, J.Beekman, W.Elkins, M.Altheimer, S.Dahlstedt   INTERVAL HISTORY:  Patient is seen, together with husband, in continuing attention to her metastatic breast cancer, having begun treatment with letrozole and everolimus on Bolero 4 study on 11-12-11 and also due zometa today. She has had intermittent epistaxis for ~ 48 hours, tho we were not aware of this prior to visit now. Bleeding has been mostly on right, but seems at times to be from left nares also. She has no other bleeding and no history of epistaxis thru her adult years. She has had no other apparent problems from the study medications, including no pain flare to this point. Per literature, epistaxis can be seen in ~ 22% of patients on everolimus. 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 She was treated with lumpectomy and 18 node axillary node dissection, adjuvant adriamycin/cytoxan followed by CMF chemotherapy, local radiation, tamoxifen from April 2003 thru Dec 2003 then arimidex from Dec 2004 thru June 2011. The arimidex had been continued for that long duration due to high risk features, stopped as there had been no evidence of disease and with known osteoporosis. She was clinically stable and labs were normal other than very slight decrease in hemoglobin when I saw her in Jan 2013. She developed acute pain in left hip in mid June which did not respond to pain medication by rheumatologist, then MRI hip 09-20-11 showed metastatic disease with pathologic fracture of femoral neck. She was admitted 7-3 thru 09-26-11, with nailing by Dr Victorino Dike on 09-24-11. Pathology from the surgery confirmed metastatic breast carcinoma, ER+ 100%, PR negative and HER 2 negative by [facility]. She received radiation 3000 cGy in 10 fractions to left femur 7-24 thru 10-28-11 by Dr Roselind Messier. Bolero 4  study requires waiting 2 weeks beyond completion of this RT. She had zometa last 10-22-11.  Patient has some nausea now that seems related to swallowing blood. Otherwise she actually seems to have felt better, including better pain control using regular hydrocodone q 6 hrs from 8-21 thru 8-25 (has used just prn since then) which helped her to sleep. Appetite also has been some better. Bowels have moved even with the increased pain medication. She is having no new or different pain, no mouth sores, no rash, no LE swelling, no fever or symptoms of infection. She has not had bothersome hot flashes.   Remainder of 10 point Review of Systems negative.  Objective:  Vital signs in last 24 hours:  BP 113/72  Pulse 95  Temp 98.9 F (37.2 C) (Oral)  Resp 18  Ht 5\' 4"  (1.626 m)  Wt 136 lb 3.2 oz (61.78 kg)  BMI 23.38 kg/m2 Weight is down 1 lb. Alert and appropriate, NAD in wheelchair but with continuous slight oozing blood from right nares. Ice pack applied and she has been instructed not to dislodge any clot for now.   HEENT:PERRLA, sclera clear, anicteric, oropharynx clear, no lesions and cannot tell site of bleeding right nares. Anteriorly in left nares some red blood not oozing. LymphaticsCervical, supraclavicular, and axillary nodes normal. Resp: clear to auscultation bilaterally and normal percussion bilaterally Cardio: regular rate and rhythm GI: soft, nontender, not distended, some bowel sounds. Cannot appreciate hepatomegaly in exam done just in [facility] Extremities: extremities normal, atraumatic, no cyanosis or edema Neuro:grossly nonfocal Skin without rash or petechiae/ecchymoses  Lab  Results:  Results for orders placed in visit on 11/19/11  CBC WITH DIFFERENTIAL      Component Value Range   WBC 6.2  3.9 - 10.3 10e3/uL   NEUT# 4.0  1.5 - 6.5 10e3/uL   HGB 9.9 (*) 11.6 - 15.9 g/dL   HCT 16.1 (*) 09.6 - 04.5 %   Platelets 201  145 - 400 10e3/uL   MCV 93.4  79.5 - 101.0 fL   MCH 29.6   25.1 - 34.0 pg   MCHC 31.7  31.5 - 36.0 g/dL   RBC 4.09 (*) 8.11 - 9.14 10e6/uL   RDW 16.7 (*) 11.2 - 14.5 %   lymph# 1.8  0.9 - 3.3 10e3/uL   MONO# 0.3  0.1 - 0.9 10e3/uL   Eosinophils Absolute 0.2  0.0 - 0.5 10e3/uL   Basophils Absolute 0.0  0.0 - 0.1 10e3/uL   NEUT% 64.5  38.4 - 76.8 %   LYMPH% 28.2  14.0 - 49.7 %   MONO% 4.0  0.0 - 14.0 %   EOS% 3.1  0.0 - 7.0 %   BASO% 0.2  0.0 - 2.0 %   Study labs from 11-06-11 had PT 16.5 with INR 1.28 and PTT 45, likely with liver involvement and recent antibiotics for UTI.  Studies/Results:  No results found.  Medications: I have reviewed the patient's current medications. She has been given 10 mg Vit K po here and she will continue this for next week.  Note she had Feraheme full dose 10-10-11.  Epistaxis initially slowed/ seemed to stop with ice. She received zometa without difficulty. Epistaxis resumed while patient was still at office after zometa. Situation was communicated to ENT on call for [facility] system, who is glad to see her at ED now  Epistaxis requiring interventions is grade 2 per protocol, such that everolimus should be held until resolution then resumed at same dose. . Assessment/Plan: 1. 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 Post pathologic fracture left femur with nail and RT, doing well there. Present epistaxis likely from everolimus, and may be moreso due to coags as above.  2.Anemia: related to iron deficiency (post feraheme 10-10-11) and bone involvement with metastatic cancer + orthopedic surgery and RT to femur. Hgb actually a little better today 3.left ureteral stent in by Dr Retta Diones 4.osteoporosis 5.hypothyroid on replacement 6.history of anxiety, depression, insomnia 7.DNR but does want treatment short of that   LIVESAY,LENNIS P, MD   11/19/2011, 2:59 PM  Addendum: patient refused to go to ED after leaving  our office. She will try Afrin nose spray at home tonight and understands that she should go to ED if bleeding worsens.

## 2011-11-19 NOTE — Telephone Encounter (Signed)
Spoke with receptionist about nose continuing to bleed and Dr. Darrold Span would like for patient to be seen today by ENT if bleeding had not stopped after treatment  today.  Nose was packet ~2 hours ago with dry curlex.  Dr. Jearld Fenton said to have patient go to [facility] ED and he would see her there.

## 2011-11-19 NOTE — Patient Instructions (Addendum)
Use Afrin nose spray every 12 hours today and tonight if bleeding continues.  If bleeding is heavy, go to ER   Take Vitamen K one daily for next week, to help blood clotting.

## 2011-11-20 ENCOUNTER — Other Ambulatory Visit: Payer: Self-pay

## 2011-11-20 ENCOUNTER — Telehealth: Payer: Self-pay

## 2011-11-20 ENCOUNTER — Other Ambulatory Visit: Payer: Self-pay | Admitting: *Deleted

## 2011-11-20 DIAGNOSIS — C787 Secondary malignant neoplasm of liver and intrahepatic bile duct: Secondary | ICD-10-CM

## 2011-11-20 DIAGNOSIS — C7951 Secondary malignant neoplasm of bone: Secondary | ICD-10-CM

## 2011-11-20 MED ORDER — PHYTONADIONE 5 MG PO TABS: 10.0000 mg | ORAL_TABLET | Freq: Every day | ORAL | Status: DC

## 2011-11-20 NOTE — Telephone Encounter (Signed)
Mr. Stormer called to let Dr. Darrold Span know that there was no blood on the packing placed last eve. at [facility] with neo-synephrine soaked gauze.

## 2011-11-21 ENCOUNTER — Telehealth: Payer: Self-pay

## 2011-11-21 ENCOUNTER — Encounter: Payer: Self-pay | Admitting: Oncology

## 2011-11-21 ENCOUNTER — Telehealth: Payer: Self-pay | Admitting: Oncology

## 2011-11-21 NOTE — Telephone Encounter (Signed)
Faxed office notes, lab, medication list and demographics/insurance information to Dr. Ezzard Standing for appointment today.

## 2011-11-21 NOTE — Telephone Encounter (Signed)
Spoke with Kelli [name] and told him that Dr. Darrold Span set Kelli [name] up to see Kelli [name] today at 1230 today.  She said to keep the appt. even if bleeding has stopped. Husband verbalized understanding.  Gave Kelli [name] the office number and address for Kelli [name].

## 2011-11-21 NOTE — Telephone Encounter (Signed)
Continued epistaxis this am per phone conversation between husband and Radio producer. This MD spoke with Dr Myrtis Hopping office, and they are glad to see her as new patient at 1230 today. Ila Mcgill, MD

## 2011-11-25 ENCOUNTER — Telehealth: Payer: Self-pay | Admitting: Oncology

## 2011-11-25 ENCOUNTER — Other Ambulatory Visit: Payer: Self-pay | Admitting: Oncology

## 2011-11-25 DIAGNOSIS — C50919 Malignant neoplasm of unspecified site of unspecified female breast: Secondary | ICD-10-CM

## 2011-11-25 NOTE — Telephone Encounter (Signed)
Per Research RN's phone conversation with patient today, no epistaxis since 11-21-11, this having stopped by time that she was seen by ENT but did have cauterization of vessel. She told RN that she was not comfortable resuming everolimus.  MD tried to call x 2 now, busy and could not LM.  Kelli Mcgill, MD

## 2011-11-25 NOTE — Telephone Encounter (Signed)
Reached patient by phone and discussed resuming everolimus on study vs going off study. I have told patient that I am comfortable with her resuming the medication, however it needs to be her decision. She will let us know tomorrow if she decides to resume/ if she takes dose today.She felt very well yesterday, not quite so well today but nothing specific. She appreciated call.  Ila Mcgill, MD

## 2011-11-26 ENCOUNTER — Telehealth: Payer: Self-pay | Admitting: *Deleted

## 2011-11-26 ENCOUNTER — Telehealth: Payer: Self-pay | Admitting: Oncology

## 2011-11-26 ENCOUNTER — Ambulatory Visit: Payer: Medicare Other | Admitting: Oncology

## 2011-11-26 NOTE — Telephone Encounter (Signed)
11/26/11 at 2:33pm - Rn received a voice mail from the pt's husband stating that his wife re-started the everolimus study drug today at 12pm.

## 2011-11-26 NOTE — Telephone Encounter (Signed)
Talked to patient's husband gave him appt for lab and MD on 12/02/11

## 2011-12-02 ENCOUNTER — Other Ambulatory Visit (HOSPITAL_BASED_OUTPATIENT_CLINIC_OR_DEPARTMENT_OTHER): Payer: Medicare Other | Admitting: Lab

## 2011-12-02 ENCOUNTER — Telehealth: Payer: Self-pay | Admitting: *Deleted

## 2011-12-02 ENCOUNTER — Ambulatory Visit (HOSPITAL_BASED_OUTPATIENT_CLINIC_OR_DEPARTMENT_OTHER): Payer: Medicare Other | Admitting: Oncology

## 2011-12-02 ENCOUNTER — Encounter: Payer: Self-pay | Admitting: *Deleted

## 2011-12-02 ENCOUNTER — Encounter: Payer: Self-pay | Admitting: Oncology

## 2011-12-02 VITALS — BP 108/64 | HR 84 | Temp 98.0°F | Resp 20 | Ht 64.0 in | Wt 134.4 lb

## 2011-12-02 DIAGNOSIS — C50919 Malignant neoplasm of unspecified site of unspecified female breast: Secondary | ICD-10-CM

## 2011-12-02 DIAGNOSIS — C7951 Secondary malignant neoplasm of bone: Secondary | ICD-10-CM

## 2011-12-02 DIAGNOSIS — D649 Anemia, unspecified: Secondary | ICD-10-CM

## 2011-12-02 DIAGNOSIS — C801 Malignant (primary) neoplasm, unspecified: Secondary | ICD-10-CM

## 2011-12-02 DIAGNOSIS — C787 Secondary malignant neoplasm of liver and intrahepatic bile duct: Secondary | ICD-10-CM

## 2011-12-02 DIAGNOSIS — K1230 Oral mucositis (ulcerative), unspecified: Secondary | ICD-10-CM

## 2011-12-02 DIAGNOSIS — C50419 Malignant neoplasm of upper-outer quadrant of unspecified female breast: Secondary | ICD-10-CM

## 2011-12-02 LAB — CBC & DIFF AND RETIC
Basophils Absolute: 0 10*3/uL (ref 0.0–0.1)
EOS%: 6 % (ref 0.0–7.0)
Eosinophils Absolute: 0.3 10*3/uL (ref 0.0–0.5)
HCT: 28 % — ABNORMAL LOW (ref 34.8–46.6)
HGB: 8.8 g/dL — ABNORMAL LOW (ref 11.6–15.9)
Immature Retic Fract: 13.8 % — ABNORMAL HIGH (ref 1.60–10.00)
MCH: 30.2 pg (ref 25.1–34.0)
MONO#: 0.5 10*3/uL (ref 0.1–0.9)
NEUT#: 2.3 10*3/uL (ref 1.5–6.5)
NEUT%: 49.9 % (ref 38.4–76.8)
RDW: 17.6 % — ABNORMAL HIGH (ref 11.2–14.5)
Retic Ct Abs: 113.49 10*3/uL — ABNORMAL HIGH (ref 33.70–90.70)
WBC: 4.7 10*3/uL (ref 3.9–10.3)
lymph#: 1.6 10*3/uL (ref 0.9–3.3)

## 2011-12-02 LAB — COMPREHENSIVE METABOLIC PANEL (CC13)
AST: 31 U/L (ref 5–34)
Alkaline Phosphatase: 160 U/L — ABNORMAL HIGH (ref 40–150)
BUN: 12 mg/dL (ref 7.0–26.0)
Creatinine: 0.8 mg/dL (ref 0.6–1.1)
Potassium: 4 mEq/L (ref 3.5–5.1)

## 2011-12-02 LAB — PROTHROMBIN TIME: INR: 1.16 (ref <1.50)

## 2011-12-02 NOTE — Progress Notes (Signed)
OFFICE PROGRESS NOTE   12/02/2011   Physicians: J.Hewitt, J.Kinard, J.Beekman, W.Elkins, M.Altheimer, S.Dahlstedt, C.Newman   INTERVAL HISTORY:  Patient is seen, together with husband, in continuing attention to her metastatic ER +/ HER 2 negative breast carcinoma involving bone and  Liver, on Bolero 4 study with letrozole and everolimus 10 mg daily since 11-12-11. Everolimus was held 8-28 thru 11-25-11 for epistaxis, resumed 9-4 thru 11-28-11 then again held after 9-6 for recurrent epistaxis. She was seen by Dr Narda Bonds of ENT on 11-21-11 with areas cauterized tho no active bleeding at the time of his exam.  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 She was treated with lumpectomy and 18 node axillary node dissection, adjuvant adriamycin/cytoxan followed by CMF chemotherapy, local radiation, tamoxifen from April 2003 thru Dec 2003 then arimidex from Dec 2004 thru June 2011. The arimidex had been continued for that long duration due to high risk features, stopped as there had been no evidence of disease and with known osteoporosis. She was clinically stable and labs were normal other than very slight decrease in hemoglobin when I saw her in Jan 2013. She developed acute pain in left hip in mid June which did not respond to pain medication by rheumatologist, then MRI hip 09-20-11 showed metastatic disease with pathologic fracture of femoral neck. She was admitted 7-3 thru 09-26-11, with nailing by Dr Victorino Dike on 09-24-11. Pathology from the surgery confirmed metastatic breast carcinoma, ER+ 100%, PR negative and HER 2 negative by [facility]. She received radiation 3000 cGy in 10 fractions to left femur 7-24 thru 10-28-11 by Dr Roselind Messier. Bolero 4 study required waiting 2 weeks beyond completion of RT to begin. She had zometa last 10-22-11.  Patient has felt significantly better overall in past ~ week. The  recurrent epistaxis resolved with afrin nose spray, packing with cotton herself and holding the everolimus. She now has minimal pain including ribs, back and LLE, controlled with occasional prn advil during day and "half of a pain pill" at hs. Appetite is improved and she is enjoying tomato sandwiches and banana splits. She is able to sleep only with ambien 5 mg (previously used 2.5 mg) + ativan at hs. Bowels are moving with no difficulty. She has had no other bleeding. She is a little short of breath with activity, not at rest, no cough or other respiratory symptoms.She had soreness upper right anterior gum area improved with salt water (and holding everolimus); she has used biotene mouthwash only a few times since starting study drugs.  She has had slight swelling without soreness left lower leg, improves with elevation, but she has been up walking more also. "I really feel good". Remainder of 10 point Review of Systems negative.  Objective:  Vital signs in last 24 hours:  BP 108/64  Pulse 84  Temp 98 F (36.7 C) (Oral)  Resp 20  Ht 5\' 4"  (1.626 m)  Wt 134 lb 6.4 oz (60.963 kg)  BMI 23.07 kg/m2 Looks much brighter and more comfortable today - the best that I have seen her since metastatic disease found. Changes position easily in WC, respirations not labored RA, no alopecia, pale. Able to get on and off exam table with minimal assistance, improved.   HEENT:PERRLA, sclera clear, anicteric and neck supple with midline trachea. Oral mucosa clear and moist, no ulceration seen including upper right anteriorly, dental plates in. No  bleeding and no eschar anterior nares. LymphaticsCervical, supraclavicular, and axillary nodes normal. Resp: clear to auscultation bilaterally and normal percussion bilaterally Cardio: regular rate and rhythm, no gallop GI: soft, non-tender; bowel sounds normal; no masses,  no organomegaly Extremities: minimal swelling without pitting edema left lower leg, none on right,  no cords or tenderness. No swelling UE  Neuro:no sensory deficits noted No central catheter Skin without rash or ecchymosis  Lab Results:  Results for orders placed in visit on 12/02/11  CBC & DIFF AND RETIC      Component Value Range   WBC 4.7  3.9 - 10.3 10e3/uL   NEUT# 2.3  1.5 - 6.5 10e3/uL   HGB 8.8 (*) 11.6 - 15.9 g/dL   HCT 09.8 (*) 11.9 - 14.7 %   Platelets 237  145 - 400 10e3/uL   MCV 96.2  79.5 - 101.0 fL   MCH 30.2  25.1 - 34.0 pg   MCHC 31.4 (*) 31.5 - 36.0 g/dL   RBC 8.29 (*) 5.62 - 1.30 10e6/uL   RDW 17.6 (*) 11.2 - 14.5 %   lymph# 1.6  0.9 - 3.3 10e3/uL   MONO# 0.5  0.1 - 0.9 10e3/uL   Eosinophils Absolute 0.3  0.0 - 0.5 10e3/uL   Basophils Absolute 0.0  0.0 - 0.1 10e3/uL   NEUT% 49.9  38.4 - 76.8 %   LYMPH% 34.3  14.0 - 49.7 %   MONO% 9.6  0.0 - 14.0 %   EOS% 6.0  0.0 - 7.0 %   BASO% 0.2  0.0 - 2.0 %   Retic % 3.90 (*) 0.70 - 2.10 %   Retic Ct Abs 113.49 (*) 33.70 - 90.70 10e3/uL   Immature Retic Fract 13.80 (*) 1.60 - 10.00 %  COMPREHENSIVE METABOLIC PANEL (CC13)      Component Value Range   Sodium 140  136 - 145 mEq/L   Potassium 4.0  3.5 - 5.1 mEq/L   Chloride 107  98 - 107 mEq/L   CO2 24  22 - 29 mEq/L   Glucose 153 (*) 70 - 99 mg/dl   BUN 86.5  7.0 - 78.4 mg/dL   Creatinine 0.8  0.6 - 1.1 mg/dL   Total Bilirubin 6.96  0.20 - 1.20 mg/dL   Alkaline Phosphatase 160 (*) 40 - 150 U/L   AST 31  5 - 34 U/L   ALT 20  0 - 55 U/L   Total Protein 5.9 (*) 6.4 - 8.3 g/dL   Albumin 2.8 (*) 3.5 - 5.0 g/dL   Calcium 8.8  8.4 - 29.5 mg/dL  APTT      Component Value Range   aPTT 39.9 (*) 24 - 37 seconds  PROTHROMBIN TIME      Component Value Range   Prothrombin Time 15.0  11.6 - 15.2 seconds   INR 1.16  <1.50    AP is down from 215 on 11-06-11, and 215 with SGOT 46 prior to orthopedic surgery. Hgb was 9.9 on 11-19-11. She had feraheme 10-10-11 and 1 unit PRBCs 10-10-11.  Studies/Results:  No results found. On study she will be due repeat scans after cycle  2 (cycle 2 day 0 will be 12-10-11). Medications: I have reviewed the patient's current medications.  Patient, husband and I have talked at length about epistaxis and mouth ulcers related to everolimus, and the fact that clinically she appears to be doing much better on this combination of drugs than I would expect on letrozole alone for just  20 days. We have discussed other options for treatment if she decides to come off study due to epistaxis. I have explained that, off study, treatment with chemotherapy is standard for vital organ involvement such as the liver mets; they understand that any treatment regimen will have some side effects. We have discussed oral xeloda briefly, as well as PAC access for IV chemotherapy (she had PAS port previously). On study she does meet criteria to dose reduce everolimus to 5 mg daily. By completion of our discussion, patient is in agreement with continuing on study with reduced dose everolimus for now. If she has recurrent epistaxis, it may help to have ENT see her while active bleeding in order to identify site to cauterize. I also suggest using saline nose spray every 1-2 hrs while awake to keep moist. With the oral ulcer she qualifies for companion study for mouthwash, which patient agrees to do and has been randomized to decadron mouthwash by research now.  Assessment/Plan: 1. Breast carcinoma: 13 nodes + in June 2002 with treatment including extended AI as above, found metastatic to bone and liver in July 2013. TO continue Bolero 4 with letrozole and dose reduced everolimus for now. She will begin decadron mouthwash per study for stomatitis. Will also be due zometa ~ 11-19-11. I will see her 12-10-11 per study with repeat labs then, or sooner if needed.  2. Anemia: apparently related to extent of bone involvement and epistaxis. Post IV iron as above. As she is feeling better today and is not to have chemotherapy now, will follow. 3.chronic insomnia 4.left ureteral  stent by Dr Retta Diones 5.osteoporosis 6.DNR but requests treatment short of that 7.history of anxiety and depression   [name],Kelli P, MD   12/02/2011, 3:14 PM

## 2011-12-02 NOTE — Telephone Encounter (Signed)
Called patient to let her know to use saline nose spray  Every 1-2 hours while awake- keeping nasal passages moist. Sometimes helps keep bleeding better controlled. Pt and husband state that she has plain saline nose spray at home.

## 2011-12-02 NOTE — Patient Instructions (Signed)
Saline nasal spray every 1-2 hours while awake.  Resume everolimus at 5 mg = 1 tab daily

## 2011-12-02 NOTE — Progress Notes (Signed)
12/02/11 @ 1500, Bolero 4, Unscheduled Visit (Cycle 1, Day 21):  [name], accompanied by her husband, into the [facility] for labs and to see Dr. Darrold Span for follow up regarding epistaxis episode.  She reported resuming everolimus and taking it on 11/26/11, 11/27/11 and then on 11/28/11.  On the evening of 11/28/11 her nose began to bleed again.  She immediately packed it with neo-synephrine soaked cotton packing and it resolved (grade 2).  She has not taken any more everolimus, although she has continued letrozole throughout.  She also reports a small mouth ulcer that started 11/24/11 and has caused moderate pain, although she had not communicated this to anyone at the [facility] until today.  Dr. Darrold Span will treat this ulcer and would like for [name] to take part in the sub-study (magic mouthwash vs. dexamethasone rinse).  She will therefore be randomized to the sub-study for patients with stomatitis (grade 2 mucositis).  [name] completed the OSDQ Day 1 questionnaire on site. Concha Norway, PharmD opened the sub-study envelope and used the top treatment assignment tab.  She will receive dexamethasone oral rinse 10ml swish and spit 3 times per day.  The dexamethasone was dispensed by the pharmacist.  At Dr. Precious Reel recommendation [name] agreed to resume the everolimus at 5mg  daily. She is willing to resume study drug.  She will continue the letrozole 2.5mg  daily as well.  She will complete daily OSDQ questionnaires regarding her mucositis until her return on 12/10/11 (she was given 8 copies of the questionnaire), and she agreed to use the dexamethasone rinse as directed.  Conferred by phone with Angus Palms at [employer] regarding the dose adjustment and the stomatitis sub-study.  She agrees that it is acceptable to dose reduce the patient at this point to allow her to stay on study.  We also discussed the patients mucositis and the sub-study.  In particular she was informed that the patient had symptoms  for several days before coming to the office for assessment.  She said the 24 hour time frame in the protocol was ideal, but not absolute for eligibility for the sub-study.  Also, clarified that the patient should use the dexamethasone as long as she is symptomatic then she can stop.  She will also complete the questionnaires only as long as she is symptomatic and using the dexamethasone rinse.  She should be given a new "booklet" of 28 daily questionnaires at the beginning of the next cycle to have on hand should the mucositis recur.

## 2011-12-04 ENCOUNTER — Other Ambulatory Visit: Payer: Self-pay | Admitting: *Deleted

## 2011-12-04 MED ORDER — SALINE NASAL SPRAY 0.65 % NA SOLN: 1.0000 | NASAL | Status: AC | PRN

## 2011-12-08 ENCOUNTER — Ambulatory Visit: Payer: Medicare Other | Admitting: Radiation Oncology

## 2011-12-08 ENCOUNTER — Other Ambulatory Visit: Payer: Self-pay | Admitting: *Deleted

## 2011-12-08 DIAGNOSIS — M84553A Pathological fracture in neoplastic disease, unspecified femur, initial encounter for fracture: Secondary | ICD-10-CM

## 2011-12-08 MED ORDER — HYDROCODONE-ACETAMINOPHEN 5-325 MG PO TABS: ORAL_TABLET | ORAL | Status: DC

## 2011-12-09 ENCOUNTER — Encounter: Payer: Self-pay | Admitting: Radiation Oncology

## 2011-12-10 ENCOUNTER — Telehealth: Payer: Self-pay | Admitting: Oncology

## 2011-12-10 ENCOUNTER — Encounter: Payer: Self-pay | Admitting: Oncology

## 2011-12-10 ENCOUNTER — Encounter: Payer: Self-pay | Admitting: Radiation Oncology

## 2011-12-10 ENCOUNTER — Ambulatory Visit
Admission: RE | Admit: 2011-12-10 | Discharge: 2011-12-10 | Disposition: A | Discharge status: Home / Self Care | Payer: Medicare Other | Source: Ambulatory Visit | Attending: Radiation Oncology | Admitting: Radiation Oncology

## 2011-12-10 ENCOUNTER — Ambulatory Visit (HOSPITAL_BASED_OUTPATIENT_CLINIC_OR_DEPARTMENT_OTHER): Payer: Medicare Other | Admitting: Oncology

## 2011-12-10 ENCOUNTER — Encounter: Payer: Self-pay | Admitting: *Deleted

## 2011-12-10 ENCOUNTER — Other Ambulatory Visit: Payer: Self-pay | Admitting: *Deleted

## 2011-12-10 ENCOUNTER — Other Ambulatory Visit (HOSPITAL_BASED_OUTPATIENT_CLINIC_OR_DEPARTMENT_OTHER): Payer: Medicare Other | Admitting: Lab

## 2011-12-10 VITALS — BP 132/76 | HR 99 | Temp 98.7°F | Resp 16 | Ht 64.0 in | Wt 136.0 lb

## 2011-12-10 DIAGNOSIS — M84453A Pathological fracture, unspecified femur, initial encounter for fracture: Secondary | ICD-10-CM

## 2011-12-10 DIAGNOSIS — C7951 Secondary malignant neoplasm of bone: Secondary | ICD-10-CM

## 2011-12-10 DIAGNOSIS — C801 Malignant (primary) neoplasm, unspecified: Secondary | ICD-10-CM

## 2011-12-10 DIAGNOSIS — C773 Secondary and unspecified malignant neoplasm of axilla and upper limb lymph nodes: Secondary | ICD-10-CM

## 2011-12-10 DIAGNOSIS — C50919 Malignant neoplasm of unspecified site of unspecified female breast: Secondary | ICD-10-CM

## 2011-12-10 DIAGNOSIS — C50419 Malignant neoplasm of upper-outer quadrant of unspecified female breast: Secondary | ICD-10-CM

## 2011-12-10 DIAGNOSIS — C787 Secondary malignant neoplasm of liver and intrahepatic bile duct: Secondary | ICD-10-CM

## 2011-12-10 DIAGNOSIS — C50912 Malignant neoplasm of unspecified site of left female breast: Secondary | ICD-10-CM

## 2011-12-10 DIAGNOSIS — C7952 Secondary malignant neoplasm of bone marrow: Secondary | ICD-10-CM

## 2011-12-10 LAB — CBC WITH DIFFERENTIAL/PLATELET
Basophils Absolute: 0 10*3/uL (ref 0.0–0.1)
HCT: 31.9 % — ABNORMAL LOW (ref 34.8–46.6)
HGB: 10.6 g/dL — ABNORMAL LOW (ref 11.6–15.9)
MONO#: 0.4 10*3/uL (ref 0.1–0.9)
NEUT%: 54.7 % (ref 38.4–76.8)
WBC: 4.7 10*3/uL (ref 3.9–10.3)
lymph#: 1.4 10*3/uL (ref 0.9–3.3)

## 2011-12-10 LAB — COMPREHENSIVE METABOLIC PANEL (CC13)
ALT: 31 U/L (ref 0–55)
BUN: 9 mg/dL (ref 7.0–26.0)
CO2: 23 mEq/L (ref 22–29)
Calcium: 9.5 mg/dL (ref 8.4–10.4)
Chloride: 107 mEq/L (ref 98–107)
Creatinine: 0.8 mg/dL (ref 0.6–1.1)

## 2011-12-10 LAB — LIPID PANEL
Cholesterol: 268 mg/dL — ABNORMAL HIGH (ref 0–200)
HDL: 62 mg/dL (ref >39)
Total CHOL/HDL Ratio: 4.3 Ratio
Triglycerides: 198 mg/dL — ABNORMAL HIGH (ref <150)
VLDL: 40 mg/dL (ref 0–40)

## 2011-12-10 LAB — PHOSPHORUS: Phosphorus: 2.5 mg/dL (ref 2.3–4.6)

## 2011-12-10 LAB — RESEARCH LABS

## 2011-12-10 MED ORDER — INV-EVEROLIMUS (RAD0001) 5MG TABLET NOVARTIS CRAD001Y24135: 2.0000 | ORAL_TABLET | Freq: Every day | ORAL | Status: DC

## 2011-12-10 NOTE — Progress Notes (Signed)
12/10/11 at 3:07pm - Bolero 4 - cycle 2, day 1 study notes.  The pt was into the clinic this am for her cycle 2, day 1 assessments.  The pt confirmed that she was fasting upon arrival to her lab appt.  The pt's study required labs and her cycle 2 research biomarkers were obtained.  The pt then had her vitals (3 BP's) and weight taken.  The pt had been in a seated position for 5 minutes before her vitals were taken.  The pt returned her cycle 1 study drug, everolimus drug kit today for drug accountability.  The pt returned 43 un-opened blister packs and 1 open pill was in the box.  Therefore, 44 pills were returned.  The pharmacist reviewed the pt's drug return for drug accountability reasons.  The pt stopped taking her everolimus several days when she developed nosebleeds without consulting our cancer center.  Therefore, her drug compliance was determined to be 78.6% for cycle 1.  The research nurse informed the study coordinator of the pt's intermittent dosing for cycle 1.  The pt was strongly encouraged to notify our office before she stops her study drug for cycle 2.  The pt was told that if she develops anymore nosebleeds then she is to call Dr. Allene Pyo office for evaluation.  The pt was seen and evaluated today by Dr. Darrold Span.  The pt returned her OSDQ forms that she completed for her diagnosed stomatitis (completion dates 12/03/11- 12/10/11).  The pt said that her stomatitis has almost resolved.  The pt was encouraged to continue with her dexamethasone solution as prescribed.  The pt was given a packet of the OSDQ forms for her to complete in the future if her stomatitis persists or re-occurs.  The pt denies any pain, nausea, diarrhea, and constipation.  She states she still has some SOB on exertion (at baseline, not worsened). Dr. Precious Reel nurse, Sallye Ober, reviewed the pt's current medications with the pt.  The pt was dispensed her cycle 2 study drug kit, everolimus today.  The pt was instructed to take 5 mg (1  tab) daily along with her letrozole 2.5mg  tablet.  The pt and her husband verbalized understanding.  The pt will get her letrozole prescription filled today.  The pt has remained consistently on her prescribed letrozole.  The pt states she sits for 1/2 of her day.  However, she states she takes care of all of her self-care needs.  She also is able to prepare supper for her and her husband a couple of nights a week.  Her husband also states she is able to go up and down the stairs several times a day now.  ECOG=2  The pt was given her future appointments for scans and cycle 3.    12/15/11 at 4:53pm- The pt was into the cancer center this afternoon for her monthly Zometa.  The pt was very happy because Dr. Victorino Dike gave her a good report.  She also has not had any recurrent nosebleeds since her last visit.  The pt stated she developed a headache on 11/30/11 which lasted 1 day (noted on 12/10/11 note).  She said she started taking Advil on 11/30/11 and stopped it per her physician's advise on 12/10/11.  The pt said that she has not taken the following drugs while on-study:  calcium carbonate, choleciferol, glucosamine chondroitin, and immodium ( pt denies any diarrhea).

## 2011-12-10 NOTE — Progress Notes (Signed)
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 Last Zometa was 11-19-11. Day 1 of study was 11-12-11.  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 She was treated with lumpectomy and 18 node axillary node dissection, adjuvant adriamycin/cytoxan followed by CMF chemotherapy, local radiation, tamoxifen from April 2003 thru Dec 2003 then arimidex from Dec 2004 thru June 2011. The arimidex had been continued for that long duration due to high risk features, stopped as there had been no evidence of disease and with known osteoporosis. She was clinically stable and labs were normal other than very slight decrease in hemoglobin when I saw her in Jan 2013. She developed acute pain in left hip in mid June which did not respond to pain medication by rheumatologist, then MRI hip 09-20-11 showed metastatic disease with pathologic fracture of femoral neck. She was admitted 7-3 thru 09-26-11, with nailing by Dr Victorino Dike on 09-24-11. Pathology from the surgery confirmed metastatic breast carcinoma, ER+ 100%, PR negative and HER 2 negative by [facility]. She had left ureteral stent by Dr Retta Diones 11-01-11 for obstructive hydronephrosis.   Patient has had some epistaxis since beginning everolimus, which appears related to this drug, and which has resolved with holding the drug each time. She has not seen ENT while bleeding was occurring, tho Dr Ezzard Standing is aware of the situation. Because of the epistaxis, everolimus dose was decreased from 10 mg daily  to 5 mg daily when I saw her on 12-02-11. She had bleeding again from right nares beginning 12-07-11 which resolved with some minimal packing at home and with Afrin spray overnight. She has not resumed everolimus as yet. She is having no other bleeding. She has had no further oral mucositis, is using the steroid mouthwash according to companion study with everolimus.  Otherwise, she has been doing progressively better since late August. She is having no pain now, including ribs, sternum, back, LE. She has not used hydrocodone in days, has taken advil up to 1-2x daily, once for HA but otherwise not for actual discomfort. I have asked her to stop advil due to no pain and the epistaxis. Appetite is better, no nausea, no problems with bowels. She is able to go up and down stairs in home multiple times daily and cooked meals this week. She is tired with exertion but also not getting any gentle exercise and I have encouraged her to try walking more. She denies respiratory symptoms. She is having difficulty sleeping at night, in part with voiding several times thru night, also needs to stop napping during day and increase exercise. She has had no fever, no symptoms of infection, no dysuria or hematuria. Remainder of 10 point Review of Systems negative.  She is to see Dr Victorino Dike on 12-15-11 and we will try to coordinate zometa that day. Her mother will be 104 on Oct 15. Objective:  Vital signs in last 24 hours:  BP 132/76  Pulse 99  Temp 98.7 F (37.1 C) (Oral)  Resp 16  Ht 5\' 4"  (1.626 m)  Wt 136 lb (61.689 kg)  BMI 23.34 kg/m2 this weight is up 2 lbs. Alert, looks very comfortable seated in exam room, respirations not labored RA, not as pale, not icteric.   HEENT:PERRLA, extra ocular movement intact, sclera clear, anicteric and oropharynx clear, no lesions. Specifically no oral mucositis lesions now and no thrush. No alopecia. LymphaticsCervical, supraclavicular, and axillary nodes normal. Resp: clear  to auscultation bilaterally and normal percussion bilaterally Cardio: regular rate and rhythm GI: soft, nontender including epigastrium, full RUQ without clear liver edge and no rub, normal bowel sounds Extremities: extremities normal, atraumatic, no cyanosis or edema. Surgical incision left hip healed. Neuro: CN, sensory, cerebellar nonfocal. Moves extremities equally. Breasts: right without dominant mass, skin or nipple findings and axilla/ RUE benign. Left with stable post surgical and RT changes, no dominant mass, no skin findings of concern. Nothing left axilla, no swelling LUE. No central catheter Skin without rash, ecchymosis, petechiae.  Lab Results:  Results for orders placed in visit on 12/10/11  CBC WITH DIFFERENTIAL      Component Value Range   WBC 4.7  3.9 - 10.3 10e3/uL   NEUT# 2.6  1.5 - 6.5 10e3/uL   HGB 10.6 (*) 11.6 - 15.9 g/dL   HCT 16.1 (*) 09.6 - 04.5 %   Platelets 186  145 - 400 10e3/uL   MCV 95.6  79.5 - 101.0 fL   MCH 31.8  25.1 - 34.0 pg   MCHC 33.2  31.5 - 36.0 g/dL   RBC 4.09 (*) 8.11 - 9.14 10e6/uL   RDW 18.0 (*) 11.2 - 14.5 %   lymph# 1.4  0.9 - 3.3 10e3/uL   MONO# 0.4  0.1 - 0.9 10e3/uL   Eosinophils Absolute 0.3  0.0 - 0.5 10e3/uL   Basophils Absolute 0.0  0.0 - 0.1 10e3/uL   NEUT% 54.7  38.4 - 76.8 %   LYMPH% 29.4  14.0 - 49.7 %   MONO% 8.9  0.0 - 14.0 %   EOS% 6.0  0.0 - 7.0 %   BASO% 1.0  0.0 - 2.0 %  COMPREHENSIVE METABOLIC PANEL (CC13)      Component Value Range   Sodium 141  136 - 145 mEq/L   Potassium 3.8  3.5 - 5.1 mEq/L   Chloride 107  98 - 107 mEq/L   CO2 23  22 - 29 mEq/L   Glucose 110 (*) 70 - 99 mg/dl   BUN 9.0  7.0 - 78.2 mg/dL   Creatinine 0.8  0.6 - 1.1 mg/dL   Total Bilirubin 9.56  0.20 - 1.20 mg/dL   Alkaline Phosphatase 172 (*) 40 - 150 U/L   AST 40 (*) 5 - 34 U/L   ALT 31  0 - 55 U/L   Total Protein 6.9  6.4 - 8.3 g/dL   Albumin 3.3 (*) 3.5 - 5.0 g/dL   Calcium 9.5  8.4 - 21.3 mg/dL  PHOSPHORUS      Component Value  Range   Phosphorus 2.5  2.3 - 4.6 mg/dL  URIC ACID (YQ65)      Component Value Range   Uric Acid, Serum 3.7  2.6 - 7.4 mg/dl  BILIRUBIN, DIRECT      Component Value Range   Bilirubin, Direct <0.1  0.0 - 0.3 mg/dL  CK      Component Value Range   Total CK 47  7 - 177 U/L  RESEARCH LABS      Component Value Range   Research Labs Collected.    LIPID PANEL  Component Value Range   Cholesterol 268 (*) 0 - 200 mg/dL   Triglycerides 098 (*) <150 mg/dL   HDL 62  >11 mg/dL   Total CHOL/HDL Ratio 4.3     VLDL 40  0 - 40 mg/dL   LDL Cholesterol 914 (*) 0 - 99 mg/dL    Hgb increased from 8.8 on 12-02-11; last transfused 10-10-11 and feraheme also 10-10-11. Studies/Results:  No results found. She will be due restaging scans after this second cycle of everolimus and letrozole, on ~ 10-11 or 10-14 (CT chest, MRI AP, bone scan).  Medications: I have reviewed the patient's current medications. She agrees to continue on study at least thru restaging scans if possible. She will resume the everolimus at 5 mg daily; I would consider decreasing dose to 5 mg QOD as allowed by study if significant epistaxis recurs, tho I would prefer ENT evaluation while bleeding to identify problem area as first step before further dose reduction. We have discussed priority of treating the metastatic breast cancer as long as we can manage the inconvenience of epistaxis. By completion of visit, patient and husband are in agreement with plan and have had their questions and concerns fully addressed.  Assessment/Plan: 1. Breast carcinoma: 13 nodes + in June 2002 with treatment including extended AI as above, found metastatic to bone and liver in July 2013. To continue Bolero 4 with letrozole and dose reduced everolimus for now. She will have zometa ~ 12-15-11, then restaging scans ~ 10-11 and I will see her at least 10-15, or sooner if needed. 2.epistaxis: appears secondary to everolimus, however clinically she is otherwise  doing much better than I would expect just from AI x 4 weeks such that she is willing to continue. If epistaxis recurs, hopefully she can see ENT while bleeding is ongoing. 3. Anemia: apparently related to extent of bone involvement and epistaxis: post IV iron. Significant improvement today.  4.chronic insomnia: as above 5.left ureteral stent by Dr Retta Diones  6.osteoporosis: now also on zometa  7.DNR but requests treatment short of that  8.history of anxiety and depression  Discussed with Research RN.  Kelli [name] P, MD   12/10/2011, 2:41 PM

## 2011-12-10 NOTE — Patient Instructions (Signed)
Call ENT promptly if nosebleed recurs.  Continue everolimus 5 mg daily

## 2011-12-10 NOTE — Progress Notes (Signed)
Radiation Oncology         [phone] ________________________________  Name: Kelli [name] MRN: [id number]  Date: 12/10/2011  DOB: [birth year]  Follow-Up Visit Note  CC: Kelli Mask, MD  Kelli [name], *  Diagnosis:   Metastatic breast cancer with osseous involvement of the left proximal femur  Interval Since Last Radiation:  6 weeks  Narrative:  The patient returns today for routine follow-up.  She seems to be doing reasonably well. She did see Kelli [name] today and recommendations for continuing on therapy were placed.  She does have some pain in the left lateral mid thigh area with standing and walking. patient does use a cane for this issue. I did review the patient's radiation fields today which covered the entire extent of her left femur. I asked  she discuss this discomfort with her surgeon. She is scheduled to see Kelli [name] later this week.                              ALLERGIES:  is allergic to other and sulfa antibiotics.  Meds: Current Outpatient Prescriptions  Medication Sig Dispense Refill  . Calcium Carbonate-Vitamin D (CALTRATE 600+D) 600-400 MG-UNIT per tablet Take 2 tablets by mouth 2 (two) times daily.       . Cholecalciferol 10000 UNITS CAPS Take 1 capsule by mouth daily.      . cyclobenzaprine (FLEXERIL) 5 MG tablet Take 5 mg by mouth 3 (three) times daily as needed.       Marland Kitchen FLUoxetine (PROZAC) 20 MG capsule Take 1 capsule (20 mg total) by mouth daily.  90 capsule  PRN  . Glucosamine-Chondroitin (COSAMIN DS PO) Take 1 tablet by mouth daily.       Marland Kitchen HYDROcodone-acetaminophen (NORCO/VICODIN) 5-325 MG per tablet Take 1 tablet every 4-6 hours as needed for pain  40 tablet  0  . HYDROmorphone (DILAUDID) 2 MG tablet Take 1 tablet (2 mg total) by mouth every 4 (four) hours as needed for pain. Take 1-2 tablets every 4 hours as needed for pain  30 tablet  0  . Investigational everolimus (RAD001) 5 MG tablet [employer] [id number] Take 2 tablets by mouth  daily. Take with a glass of water.  70 tablet  0  . letrozole (FEMARA) 2.5 MG tablet Take 1 tablet (2.5 mg total) by mouth daily.  30 tablet  5  . levothyroxine (SYNTHROID, LEVOTHROID) 75 MCG tablet Take 75 mcg by mouth every morning.       . loperamide (IMODIUM A-D) 2 MG tablet Take 2 mg by mouth 4 (four) times daily as needed.      Marland Kitchen LORazepam (ATIVAN) 0.5 MG tablet Take 0.5 mg by mouth every 8 (eight) hours as needed. For anxiety      . ondansetron (ZOFRAN) 8 MG tablet Take 1 tablet (8 mg total) by mouth every 8 (eight) hours as needed for nausea. Will not make drowsy.  30 tablet  0  . oxybutynin (DITROPAN) 5 MG tablet Take 1 tablet (5 mg total) by mouth 3 (three) times daily.  30 tablet  11  . pantoprazole (PROTONIX) 40 MG tablet Take 40 mg by mouth Daily.      Marland Kitchen senna-docusate (SENOKOT S) 8.6-50 MG per tablet Take 2 tablets by mouth 2 (two) times daily as needed.      . sodium chloride (OCEAN) 0.65 % nasal spray Place 1 spray into the nose as needed (to keep nasal  passages moist). Use every 1-2 hours while awake to keep nasal passages moist.  30 mL  12  . vitamin B-12 (CYANOCOBALAMIN) 1000 MCG tablet Take 1,000 mcg by mouth daily.      Marland Kitchen zolpidem (AMBIEN) 5 MG tablet Take 1 tablet (5 mg total) by mouth at bedtime as needed.  30 tablet  3    Physical Findings: The patient is in no acute distress. Patient is alert and oriented.  vitals were not taken for this visit..  No significant changes. She has some mild pain with palpation along the left lateral mid thigh area. There is no discrete mass noted.  Lab Findings: Lab Results  Component Value Date   WBC 4.7 12/10/2011   HGB 10.6* 12/10/2011   HCT 31.9* 12/10/2011   MCV 95.6 12/10/2011   PLT 186 12/10/2011    @LASTCHEM @  Radiographic Findings: No results found.  Impression:  The patient is recovering from the effects of radiation.    Plan:  When necessary followup. The patient will continue close followup with Dr.  Darrold [name].  _____________________________________  -----------------------------------  Billie Lade, PhD, MD

## 2011-12-10 NOTE — Progress Notes (Signed)
Patient arrived  Late, had appt with Dr. Darrold Span first, patient arrived via w/c, medications and vitals done in Med Onc, put patient in room paged Dr.KInard 11:04 AM

## 2011-12-10 NOTE — Telephone Encounter (Signed)
gv pt appt schedule for September and October including ct/mri/bone scan appts. nikki in research aware LL entered f/u for 10/15 and she is on call 10/16. Lowella Bandy also aware that scans have been separated and will be 10/11 and 10/14.

## 2011-12-10 NOTE — Progress Notes (Signed)
Addendum to office note 12-10-11  ECOG PS 1-2  L.Darrold Span, MD

## 2011-12-15 ENCOUNTER — Ambulatory Visit (HOSPITAL_BASED_OUTPATIENT_CLINIC_OR_DEPARTMENT_OTHER): Payer: Medicare Other

## 2011-12-15 ENCOUNTER — Other Ambulatory Visit: Payer: Self-pay | Admitting: Physician Assistant

## 2011-12-15 VITALS — BP 137/74 | HR 101 | Temp 97.3°F | Resp 18

## 2011-12-15 DIAGNOSIS — C50419 Malignant neoplasm of upper-outer quadrant of unspecified female breast: Secondary | ICD-10-CM

## 2011-12-15 DIAGNOSIS — C7951 Secondary malignant neoplasm of bone: Secondary | ICD-10-CM

## 2011-12-15 DIAGNOSIS — C7952 Secondary malignant neoplasm of bone marrow: Secondary | ICD-10-CM

## 2011-12-15 MED ORDER — SODIUM CHLORIDE 0.9 % IV SOLN
Freq: Once | INTRAVENOUS | Status: AC
  Administered 2011-12-15: 16:00:00 via INTRAVENOUS

## 2011-12-15 MED ORDER — ZOLEDRONIC ACID 4 MG/5ML IV CONC
4.0000 mg | Freq: Once | INTRAVENOUS | Status: AC
  Administered 2011-12-15: 4 mg via INTRAVENOUS
  Filled 2011-12-15: qty 5

## 2011-12-15 NOTE — Patient Instructions (Signed)
Patient aware of next appointment; discharged home with no complaints. 

## 2011-12-17 ENCOUNTER — Other Ambulatory Visit: Payer: Self-pay | Admitting: Oncology

## 2011-12-17 DIAGNOSIS — K1230 Oral mucositis (ulcerative), unspecified: Secondary | ICD-10-CM

## 2011-12-17 MED ORDER — DEXAMETHASONE 0.5 MG/5ML PO SOLN: 1.0000 mg | Freq: Three times a day (TID) | ORAL | Status: DC

## 2012-01-02 ENCOUNTER — Ambulatory Visit (HOSPITAL_COMMUNITY)
Admission: RE | Admit: 2012-01-02 | Discharge: 2012-01-02 | Disposition: A | Discharge status: Home / Self Care | Payer: Medicare Other | Source: Ambulatory Visit | Attending: Oncology | Admitting: Oncology

## 2012-01-02 ENCOUNTER — Other Ambulatory Visit (HOSPITAL_COMMUNITY): Payer: Medicare Other

## 2012-01-02 DIAGNOSIS — K7689 Other specified diseases of liver: Secondary | ICD-10-CM | POA: Insufficient documentation

## 2012-01-02 DIAGNOSIS — S22009A Unspecified fracture of unspecified thoracic vertebra, initial encounter for closed fracture: Secondary | ICD-10-CM | POA: Insufficient documentation

## 2012-01-02 DIAGNOSIS — J984 Other disorders of lung: Secondary | ICD-10-CM | POA: Insufficient documentation

## 2012-01-02 DIAGNOSIS — M8448XA Pathological fracture, other site, initial encounter for fracture: Secondary | ICD-10-CM | POA: Insufficient documentation

## 2012-01-02 DIAGNOSIS — C7951 Secondary malignant neoplasm of bone: Secondary | ICD-10-CM | POA: Insufficient documentation

## 2012-01-02 DIAGNOSIS — J701 Chronic and other pulmonary manifestations due to radiation: Secondary | ICD-10-CM | POA: Insufficient documentation

## 2012-01-02 DIAGNOSIS — C50919 Malignant neoplasm of unspecified site of unspecified female breast: Secondary | ICD-10-CM | POA: Insufficient documentation

## 2012-01-02 DIAGNOSIS — Z923 Personal history of irradiation: Secondary | ICD-10-CM | POA: Insufficient documentation

## 2012-01-02 DIAGNOSIS — X58XXXA Exposure to other specified factors, initial encounter: Secondary | ICD-10-CM | POA: Insufficient documentation

## 2012-01-02 DIAGNOSIS — C7952 Secondary malignant neoplasm of bone marrow: Secondary | ICD-10-CM | POA: Insufficient documentation

## 2012-01-02 DIAGNOSIS — D739 Disease of spleen, unspecified: Secondary | ICD-10-CM | POA: Insufficient documentation

## 2012-01-02 DIAGNOSIS — Y842 Radiological procedure and radiotherapy as the cause of abnormal reaction of the patient, or of later complication, without mention of misadventure at the time of the procedure: Secondary | ICD-10-CM | POA: Insufficient documentation

## 2012-01-02 DIAGNOSIS — J479 Bronchiectasis, uncomplicated: Secondary | ICD-10-CM | POA: Insufficient documentation

## 2012-01-02 MED ORDER — IOHEXOL 300 MG/ML  SOLN
80.0000 mL | Freq: Once | INTRAMUSCULAR | Status: AC | PRN
  Administered 2012-01-02: 80 mL via INTRAVENOUS

## 2012-01-02 MED ORDER — GADOBENATE DIMEGLUMINE 529 MG/ML IV SOLN
15.0000 mL | Freq: Once | INTRAVENOUS | Status: AC | PRN
  Administered 2012-01-02: 15 mL via INTRAVENOUS

## 2012-01-05 ENCOUNTER — Encounter: Payer: Self-pay | Admitting: *Deleted

## 2012-01-05 ENCOUNTER — Encounter (HOSPITAL_COMMUNITY)
Admission: RE | Admit: 2012-01-05 | Discharge: 2012-01-05 | Disposition: A | Discharge status: Home / Self Care | Payer: Medicare Other | Source: Ambulatory Visit | Attending: Oncology | Admitting: Oncology

## 2012-01-05 DIAGNOSIS — C50919 Malignant neoplasm of unspecified site of unspecified female breast: Secondary | ICD-10-CM | POA: Insufficient documentation

## 2012-01-05 DIAGNOSIS — C7952 Secondary malignant neoplasm of bone marrow: Secondary | ICD-10-CM | POA: Insufficient documentation

## 2012-01-05 DIAGNOSIS — C7951 Secondary malignant neoplasm of bone: Secondary | ICD-10-CM | POA: Insufficient documentation

## 2012-01-05 MED ORDER — TECHNETIUM TC 99M MEDRONATE IV KIT
25.0000 | PACK | Freq: Once | INTRAVENOUS | Status: AC | PRN
  Administered 2012-01-05: 25 via INTRAVENOUS

## 2012-01-05 NOTE — Progress Notes (Signed)
01/05/12 at 11:01am - [employer] Bolero 4 - RECIST readings- Rn spoke to both radiologists today about this patients scans from 01/02/12.  Dr. Molli Posey agreed to make an addendum to add several target lesions that were omitted in his original report dated 01/02/12.  He also agreed to clarify the current and past measurements regarding target lesion #5 (inferior right hepatic lobe).  The pt was noted to have reached a partial response after her first 2 cycles on-study.  The nurse then spoke to Dr. Fredirick Lathe about the pt's CT chest report.  There was a statement mentioned in the original report dated 01/02/12 about a "lesion in the dome of the liver measuring 10mm appears new".  The research nurse explained that the pt has had a partial response and that all of the target lesions have decreased.  Dr. Fredirick Lathe said that a CT chest is a very different modality than MRI.  She said that there was no evidence of new metastatic disease in the chest.  She also stated she would have to defer any liver lesions to the MRI report which is a more accurate reflection of the pt's disease status.  The research nurse stated that Dr. Huntley Estelle MRI report read "no evidence for new or progressive disease in the abdomen".    01/06/12 at 11:53am- Dr. Darrold Span, the pt's physician, reviewed the pt's scans from 01/02/12 and 01/05/12 today.  The MD felt the pt has had a good response to her study treatment.  She states there is no definitive evidence of disease progression.  Therefore, the pt will continue with her on-study treatment (everolimus 5 mg daily and letrozole 2.5 mg daily).

## 2012-01-06 ENCOUNTER — Other Ambulatory Visit: Payer: Self-pay | Admitting: *Deleted

## 2012-01-06 ENCOUNTER — Other Ambulatory Visit (HOSPITAL_BASED_OUTPATIENT_CLINIC_OR_DEPARTMENT_OTHER): Payer: Medicare Other | Admitting: Lab

## 2012-01-06 ENCOUNTER — Encounter: Payer: Self-pay | Admitting: Oncology

## 2012-01-06 ENCOUNTER — Encounter: Payer: Self-pay | Admitting: *Deleted

## 2012-01-06 ENCOUNTER — Ambulatory Visit (HOSPITAL_BASED_OUTPATIENT_CLINIC_OR_DEPARTMENT_OTHER): Payer: Medicare Other | Admitting: Oncology

## 2012-01-06 ENCOUNTER — Other Ambulatory Visit: Payer: Medicare Other | Admitting: Lab

## 2012-01-06 ENCOUNTER — Ambulatory Visit: Payer: Medicare Other | Admitting: Oncology

## 2012-01-06 VITALS — BP 135/80 | HR 100 | Temp 98.2°F | Resp 20 | Ht 64.0 in | Wt 137.0 lb

## 2012-01-06 DIAGNOSIS — C50919 Malignant neoplasm of unspecified site of unspecified female breast: Secondary | ICD-10-CM

## 2012-01-06 DIAGNOSIS — R21 Rash and other nonspecific skin eruption: Secondary | ICD-10-CM

## 2012-01-06 DIAGNOSIS — C787 Secondary malignant neoplasm of liver and intrahepatic bile duct: Secondary | ICD-10-CM

## 2012-01-06 DIAGNOSIS — C7951 Secondary malignant neoplasm of bone: Secondary | ICD-10-CM

## 2012-01-06 DIAGNOSIS — C50912 Malignant neoplasm of unspecified site of left female breast: Secondary | ICD-10-CM

## 2012-01-06 DIAGNOSIS — M84453A Pathological fracture, unspecified femur, initial encounter for fracture: Secondary | ICD-10-CM

## 2012-01-06 DIAGNOSIS — G4700 Insomnia, unspecified: Secondary | ICD-10-CM

## 2012-01-06 DIAGNOSIS — C7952 Secondary malignant neoplasm of bone marrow: Secondary | ICD-10-CM

## 2012-01-06 DIAGNOSIS — M810 Age-related osteoporosis without current pathological fracture: Secondary | ICD-10-CM

## 2012-01-06 DIAGNOSIS — C801 Malignant (primary) neoplasm, unspecified: Secondary | ICD-10-CM

## 2012-01-06 LAB — COMPREHENSIVE METABOLIC PANEL (CC13)
Alkaline Phosphatase: 108 U/L (ref 40–150)
BUN: 9 mg/dL (ref 7.0–26.0)
Glucose: 122 mg/dl — ABNORMAL HIGH (ref 70–99)
Sodium: 143 mEq/L (ref 136–145)
Total Bilirubin: 0.5 mg/dL (ref 0.20–1.20)

## 2012-01-06 LAB — CBC WITH DIFFERENTIAL/PLATELET
Eosinophils Absolute: 0.4 10*3/uL (ref 0.0–0.5)
LYMPH%: 25.7 % (ref 14.0–49.7)
MCH: 31.6 pg (ref 25.1–34.0)
MCV: 92.7 fL (ref 79.5–101.0)
MONO%: 10.2 % (ref 0.0–14.0)
NEUT#: 2.1 10*3/uL (ref 1.5–6.5)
Platelets: 192 10*3/uL (ref 145–400)
RBC: 3.63 10*6/uL — ABNORMAL LOW (ref 3.70–5.45)

## 2012-01-06 LAB — CK: Total CK: 54 U/L (ref 7–177)

## 2012-01-06 LAB — LIPID PANEL
HDL: 61 mg/dL (ref >39)
Triglycerides: 291 mg/dL — ABNORMAL HIGH (ref <150)

## 2012-01-06 LAB — BILIRUBIN, DIRECT: Bilirubin, Direct: <0.1 mg/dL (ref 0.0–0.3)

## 2012-01-06 MED ORDER — ZOLPIDEM TARTRATE 5 MG PO TABS: 5.0000 mg | ORAL_TABLET | Freq: Every evening | ORAL | Status: DC | PRN

## 2012-01-06 MED ORDER — INV-EVEROLIMUS (RAD0001) 5MG TABLET NOVARTIS CRAD001Y24135: 1.0000 | ORAL_TABLET | Freq: Every day | ORAL | Status: DC

## 2012-01-06 NOTE — Telephone Encounter (Signed)
gv and printed appt schedule for Oct Nov and Jan.. To pt

## 2012-01-06 NOTE — Progress Notes (Signed)
01/06/12 at 1:54pm [employer] Bolero 4- cycle 3, day 1 study notes- The pt was into the cancer center this morning for her cycle 3, day 1 study assessments.  She reported that she had been fasting overnight for her required labs this am.  The pt returned her cycle 2 everolimus study drug kit.  She reported that she took her everolimus 5 mg daily from 12/10/11 through 01/06/12 (pt took her everolimus 5mg  with food after her labs were drawn this am).  The pt returned 42 pills in the drug kit.  The pt took 28 doses of her everolimus.  The pt was prescribed 5 mg of everolimus for cycle 2.  Therefore, the pt was 100% compliant.  The pt will start her cycle 3 dosing tomorrow, 01/07/12.  The pt's BP was taken 3 times after the pt was seated for at least 5 minutes.  The pt's current medications were reviewed by Carola Rhine, Dr. Precious Reel nurse.  The research nurse also reviewed the concomitant medication log with pt and her husband.  She stated that she is no longer taking the Sennokot S,  Vitamin B-12, and Zofran.  She also reports that she has not been using the Afrin nasal spray.  She specifically denies any problems with constipation or diarrhea.  She states she has a BM every other day.  She also denies nausea and vomiting.  She reports a good appetite with a stable weight.  She is still have ongoing insomnia which she takes Ambien prn.  She denies any urinary symptoms and numbness to her hands and feet.  She still has some ongoing dyspnea with exertion, but the pt states this has not worsened since baseline.  She states on rare occasion she will has leg bone pain which was reported at baseline.  She said that Dr. Victorino Dike felt that the pin that he placed in her leg may be irritating her thigh muscle.  She said that she also recently fell on this leg also.  The pt states she still uses a cane, but she is able to do most of her usual activities.  She states she is moving much better these days.  ECOG=1.  The pt was seen  and evaluated by Dr. Darrold Span today.  The pt was told that her scans revealed that she was responding to her treatment.  The pt said that she developed an itchy, rash on Friday night after her scans were done on 01/02/12.  Dr. Darrold Span stated that she did not feel that the rash was related to the everolimus or the letrozole.  The pt's rash was almost resolved today.  The pt called the physician on-call on 01/03/12, and he advised the pt to take Benadryl 25 mg - 50 mg ( 1 to 2 tabs prn rash/itching).  The pt also reported some back and abdominal/upper pelvis pain described as mild on 12/24/11, and it resolved on 12/27/11 ( back and abdominal pain reported at baseline).  The pt also states she had a mild nosebleed this am which required no intervention (grade 1).  The pt was dispensed her new cycle 3 kit of everolimus.  The pt was instructed to remain at the 5 mg dose ( 1 tablet daily) with the letrozole 2.5 mg dose daily.  She was given her cycle 4 appointments for 02/04/12.    01/06/12 at 4:00pm- The research nurse discussed the pt's increasing cholesterol and triglycerides with Dr. Darrold Span.  She feels that these AE's are related to the  everolimus.  Will continue to monitor these lab values.

## 2012-01-06 NOTE — Patient Instructions (Signed)
Appointments as scheduled    Leg exercises as recommended by PT

## 2012-01-06 NOTE — Progress Notes (Signed)
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 Since she was here last she has tolerated everolimus at 5 mg daily without further significant epistaxis, and otherwise has felt progressively better overall.  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 She was treated with lumpectomy and 18 node axillary node dissection, adjuvant adriamycin/cytoxan followed by [facility] chemotherapy, local radiation, tamoxifen from April 2003 thru Dec 2003 then arimidex from Dec 2004 thru June 2011. The arimidex had been continued for that long duration due to high risk features, stopped as there had been no evidence of disease and with known osteoporosis. She was clinically stable and labs were normal other than very slight decrease in hemoglobin when I saw her in Jan 2013. She developed acute pain in left hip in mid June which did not respond to pain medication by rheumatologist, then MRI hip 09-20-11 showed metastatic disease with pathologic fracture of femoral neck. She was admitted 7-3 thru 09-26-11, with nailing by Dr Victorino Dike on 09-24-11. Pathology from the surgery confirmed metastatic breast carcinoma, ER+ 100%, PR negative and HER 2 negative by [facility]. She received radiation 3000 cGy in 10 fractions to left femur 7-24 thru 10-28-11 by Dr Roselind Messier. Bolero 4 study required waiting 2 weeks beyond completion of RT to begin, and was started 11-12-11. Last zometa was 12-15-11. Restaging scans per protocol were done on 01-05-12, with no new metastatic disease chest, abdomen or pelvis, improvement in  multiple liver lesions and in right femoral neck lesion by CT,  tho bone scan looks stable. CT chest compared with 09-24-11 scan (? not with  11-06-11 scan) shows posterior right rib fractures and superior endplate compression of an upper thoracic vertebrae.  Patient tells me that she feels well other than difficulty sleeping (a chronic problem) due to anxiety, and some soreness lateral upper left thigh where she has resolving ecchymosis sustained in a fall at home the day after her last visit to Dr Victorino Dike (tripped on furniture). She also notices some aching across right upper pelvis when sitting in hard chairs. She is having no pain otherwise and has not needed any pain medication in several days. She had pruritic rash forearms, upper abdomen and left thigh after scans, which has essentially resolved after a few doses of oral benadryl; she had no associated respiratory symptoms with the rash. She had extremely minimal epistaxis this am for first time since everolimus dose was decreased. She has had no other bleeding. She does not have mucositis symptoms now, using the steroid mouthwash only prn. Appetite is very good, including a deluxe hamburger yesterday. She is walking easily in the house, has not been walking outside, and has begun some exercises for LLE as previously instructed by PT. She denies respiratory symptoms. Bowels are moving without difficulty and no urinary symptoms. No fever.  Remainder of 10 point Review of Systems negative.  ECOG PS 1- 2 Objective:  Vital signs in last 24 hours:  BP 135/80  Pulse 100  Temp 98.2 F (36.8 C) (Oral)  Resp 20  Ht 5\' 4"  (1.626 m)  Wt 137 lb (  62.143 kg)  BMI 23.52 kg/m2 Weight is up one lb. She is easily mobile in exam room, using cane for office hall. Alert, looks comfortable and cheerful, husband very supportive.   HEENT:PERRLA, extra ocular movement intact, sclera clear, anicteric, oropharynx clear, no lesions and neck supple with midline trachea,  no thyroid enlargement. Nares not remarkable, no bleeding.  LymphaticsCervical, supraclavicular, and axillary nodes normal.No inguinal adenopathy. Resp: clear to auscultation bilaterally and normal percussion bilaterally Back nontender, no findings right flank area. Cardio: regular rate and rhythm, clear heart sounds. GI: soft, nontender, some normal bowel sounds, full RUQ without clear liver edge Extremities: no pitting edema, cords, tenderness in lower legs. Resolving ecchymosis ~ 5 cm lateral left upper thigh. Surgical incision in same area well-healed. No swelling UE, no problems at IV site used for scans right hand. Neuro: speech fluent and appropriate. CN intact. Moves all extremities equally, no sensory deficits, cerebellar intact.  Breasts: left with lumpectomy changes, no dominant mass, no skin or nipple findings. Right without dominant mass, skin or nipple findings. Nothing in either axillae. No swelling bilateral UE. Skin with ecchymosis LLE as above. Minimal scattered slightly erythematous rash on forearms, nothing seen on upper abdomen or left thigh. No other rash. Vascular: not remarkable including IV site right hand.   Lab Results:  Results for orders placed in visit on 01/06/12  CBC WITH DIFFERENTIAL      Component Value Range   WBC 3.9  3.9 - 10.3 10e3/uL   NEUT# 2.1  1.5 - 6.5 10e3/uL   HGB 11.5 (*) 11.6 - 15.9 g/dL   HCT 40.9 (*) 81.1 - 91.4 %   Platelets 192  145 - 400 10e3/uL   MCV 92.7  79.5 - 101.0 fL   MCH 31.6  25.1 - 34.0 pg   MCHC 34.0  31.5 - 36.0 g/dL   RBC 7.82 (*) 9.56 - 2.13 10e6/uL   RDW 15.6 (*) 11.2 - 14.5 %   lymph# 1.0  0.9 - 3.3 10e3/uL   MONO# 0.4  0.1 - 0.9 10e3/uL   Eosinophils Absolute 0.4  0.0 - 0.5 10e3/uL   Basophils Absolute 0.1  0.0 - 0.1 10e3/uL   NEUT% 52.3  38.4 - 76.8 %   LYMPH% 25.7  14.0 - 49.7 %   MONO% 10.2  0.0 - 14.0 %   EOS% 10.2 (*) 0.0 - 7.0 %   BASO% 1.6  0.0 - 2.0 %  COMPREHENSIVE METABOLIC PANEL (CC13)      Component  Value Range   Sodium 143  136 - 145 mEq/L   Potassium 3.5  3.5 - 5.1 mEq/L   Chloride 108 (*) 98 - 107 mEq/L   CO2 23  22 - 29 mEq/L   Glucose 122 (*) 70 - 99 mg/dl   BUN 9.0  7.0 - 08.6 mg/dL   Creatinine 0.8  0.6 - 1.1 mg/dL   Total Bilirubin 5.78  0.20 - 1.20 mg/dL   Alkaline Phosphatase 108  40 - 150 U/L   AST 33  5 - 34 U/L   ALT 21  0 - 55 U/L   Total Protein 6.7  6.4 - 8.3 g/dL   Albumin 3.4 (*) 3.5 - 5.0 g/dL   Calcium 9.7  8.4 - 46.9 mg/dL  CK      Component Value Range   Total CK 54  7 - 177 U/L  BILIRUBIN, DIRECT      Component Value Range   Bilirubin, Direct <0.1  0.0 -  0.3 mg/dL  PHOSPHORUS      Component Value Range   Phosphorus 2.5  2.3 - 4.6 mg/dL  URIC ACID (JX91)      Component Value Range   Uric Acid, Serum 2.8  2.6 - 7.4 mg/dl  RESEARCH LABS      Component Value Range   Research Labs Collected.    Hgb is up ~ 1 gram from 12-10-11   Lipids increased from baseline, with cholesterol 276 and triglycerides 291. Discussed with research RN, protocol requires intervention if >300. Studies/Results:  Nm Bone Scan Whole Body  01/05/2012  *RADIOLOGY REPORT*  Clinical Data: Metastatic breast cancer with bone pain.  NUCLEAR MEDICINE WHOLE BODY BONE SCINTIGRAPHY  Technique:  Whole body anterior and posterior images were obtained approximately 3 hours after intravenous injection of radiopharmaceutical.  Radiopharmaceutical: CURIE TC-MDP TECHNETIUM TC 104M MEDRONATE IV KIT  Comparison: 11/06/2011.  RECIST protocol 1.0, non target lesions:  Osseous metastatic disease - present.  Findings: Overall number of abnormal foci of radiotracer uptake in the axial or appendicular skeleton appears stable. Abnormal foci are seen in the calvarium, spine, ribs, pelvis, femurs and left tibial shaft.  No definite new areas of uptake.  IMPRESSION: Stable widespread osseous metastatic disease.   Original Report Authenticated By: Reyes Ivan, M.D.      CT chest 01-02-12  1. No  evidence of new metastatic disease in the chest.  2. Mixed appearance of hepatic metastatic disease when compared  with 09/24/2011. Please refer to MR abdomen performed earlier  today for further details and discussion.  3. Widespread osseous metastatic disease with right-sided  pathologic compression fractures, new from 09/24/2011.  4. New superior endplate compression fracture involving an upper  thoracic vertebral body.  NOTE this states compared with 09-24-11   MRI Abdomen 01-05-12 Interval decrease and liver lesions   . No evidence for new or progressive disease in the abdomen.  Diffuse bony metastatic disease.  **ADDENDUM** CREATED: 01/05/2012 09:56:07  For clarification, the inferior right liver lesion (image 57) is  2.2 x 2.2 cm today compared to 3.5 x 3.2 cm previously.  Additional target lesions not mentioned in the body the report  include a posterior segment right hepatic lobe lesion that measures  1.5 x 1.3 cm today compared 2.0 x 2.0 cm previously.  The upper abdominal/retroperitoneal lymphadenopathy measured  previously at 1.2 cm short-axis now measures 0.5 cm short-axis   MRI PELVIS  Findings: Small amount of free fluid is seen in the pelvis. No  evidence for pelvic lymphadenopathy.  Diffuse bony metastatic lesions are again noted. The index lesion  in the right femoral neck measures 1.4 x 1.3 cm today compared 1.4  x 1.6 cm previously.  IMPRESSION:  Diffuse bony metastatic disease.   Medications: I have reviewed the patient's current medications. She refuses flu vaccine.   Assessment/Plan: 1. Metastatic breast cancer ER positive Her2 negative involving bone, liver and soft tissues including near left ureter and left parapharyngeal space: diagnosis made 09-24-11 at nailing of pathologic left femur fracture, this 11 years after primary diagnosis of T1 13 node positive left breast cancer and 2 years after stopping arimidex. Responding well to treatment on protocol  with letrozole and everolimus. Will have zometa next week and I will see her back per protocol 02-04-12 or sooner if needed. 2.left ureteral stent in by Dr Retta Diones 3. Rash possibly related to IV contrast for scans vs contact with something during radiology procedures. Do not feel this is related  to everolimus 4.increase in cholesterol and triglycerides likely related to everolimus: see above 5. Osteoporosis: now also on IV bisphosphonate 6.history of anxiety/depression/ chronic insomnia 7.refuses flu shot 8.DNR but wants treatment short of that.  I have asked [facility] financial staff to address incorrect coding for zometa. Patient and husband are pleased with progress and in agreement with plan above.     Nikolaus Pienta P, MD   01/06/2012, 11:57 AM

## 2012-01-07 ENCOUNTER — Telehealth: Payer: Self-pay | Admitting: *Deleted

## 2012-01-07 NOTE — Telephone Encounter (Signed)
Per staff message and POF I have scheduled appts.  JMW  

## 2012-01-12 ENCOUNTER — Encounter: Payer: Self-pay | Admitting: *Deleted

## 2012-01-12 ENCOUNTER — Ambulatory Visit (HOSPITAL_BASED_OUTPATIENT_CLINIC_OR_DEPARTMENT_OTHER): Payer: Medicare Other

## 2012-01-12 ENCOUNTER — Ambulatory Visit: Payer: Medicare Other

## 2012-01-12 VITALS — BP 128/69 | HR 94 | Temp 98.0°F | Resp 18

## 2012-01-12 DIAGNOSIS — C50419 Malignant neoplasm of upper-outer quadrant of unspecified female breast: Secondary | ICD-10-CM

## 2012-01-12 DIAGNOSIS — C7951 Secondary malignant neoplasm of bone: Secondary | ICD-10-CM

## 2012-01-12 DIAGNOSIS — C7952 Secondary malignant neoplasm of bone marrow: Secondary | ICD-10-CM

## 2012-01-12 MED ORDER — SODIUM CHLORIDE 0.9 % IV SOLN
4.0000 mg | Freq: Once | INTRAVENOUS | Status: AC
  Administered 2012-01-12: 4 mg via INTRAVENOUS
  Filled 2012-01-12: qty 5

## 2012-01-12 MED ORDER — SODIUM CHLORIDE 0.9 % IV SOLN
Freq: Once | INTRAVENOUS | Status: AC
  Administered 2012-01-12: 15:00:00 via INTRAVENOUS

## 2012-01-12 NOTE — Patient Instructions (Signed)
[facility] Discharge Instructions for Patients Receiving Chemotherapy  Today you received the following chemotherapy agents Zometa To help prevent nausea and vomiting after your treatment, we encourage you to take your nausea medication as prescribed. If you develop nausea and vomiting that is not controlled by your nausea medication, call the clinic. If it is after clinic hours your family physician or the after hours number for the clinic or go to the Emergency Department.   BELOW ARE SYMPTOMS THAT SHOULD BE REPORTED IMMEDIATELY:  *FEVER GREATER THAN 100.5 F  *CHILLS WITH OR WITHOUT FEVER  NAUSEA AND VOMITING THAT IS NOT CONTROLLED WITH YOUR NAUSEA MEDICATION  *UNUSUAL SHORTNESS OF BREATH  *UNUSUAL BRUISING OR BLEEDING  TENDERNESS IN MOUTH AND THROAT WITH OR WITHOUT PRESENCE OF ULCERS  *URINARY PROBLEMS  *BOWEL PROBLEMS  UNUSUAL RASH Items with * indicate a potential emergency and should be followed up as soon as possible.  One of the nurses will contact you 24 hours after your treatment. Please let the nurse know about any problems that you may have experienced. Feel free to call the clinic you have any questions or concerns. The clinic phone number is [phone].   I have been informed and understand all the instructions given to me. I know to contact the clinic, my physician, or go to the Emergency Department if any problems should occur. I do not have any questions at this time, but understand that I may call the clinic during office hours   should I have any questions or need assistance in obtaining follow up care.    __________________________________________  _____________  __________ Signature of Patient or Authorized Representative            Date                   Time    __________________________________________ Nurse's Signature    

## 2012-01-12 NOTE — Progress Notes (Signed)
01/12/12 at 4:22pm - The pt was into the cancer center this afternoon for her monthly Zometa infusion.  The research nurse explained to the pt and her husband about the new "pre-service center" for informing pt's of their insurance benefits, including co-pays, deductibles, and trying to obtain these charges before the service.  The pt and her husband said that they feel better knowing that this is a Emergency planning/management officer procedure.  The pt said that she had never done this on any prior radiology procedures at [facility].  The pt was informed that her scans will not be canceled or re-scheduled due to the inability to pay.  The pt was reassured that if she cannot make the payment then her scans will be done as scheduled.  Also, Dr. Darrold Span wanted the research nurse to explain to the pt about her increasing cholesterol and triglyceride levels while she was here for her infusion.  The research nurse spent 10 minutes going over in detail with the pt her baseline lab values and her cycle 3 lipid lab values.  The pt was informed that her increasing lipid lab values ( cholesterol and triglycerides) were felt to be related to her everolimus.  The pt was told that they are still grade 1 toxicities.  She was told if these values were to go above 300 then Dr. Darrold Span will place her on a medication to lower these values.  The pt was reassured that the research nurse and Dr. Darrold Span will monitor these values closely.  The pt denies any episodes of epistaxis.  She said her last episode was on 01/06/12.  She also states her rash has completely resolved.

## 2012-01-15 ENCOUNTER — Other Ambulatory Visit: Payer: Self-pay | Admitting: *Deleted

## 2012-01-15 DIAGNOSIS — C50419 Malignant neoplasm of upper-outer quadrant of unspecified female breast: Secondary | ICD-10-CM

## 2012-01-15 MED ORDER — HYDROCODONE-ACETAMINOPHEN 5-325 MG PO TABS: ORAL_TABLET | ORAL | Status: DC

## 2012-01-31 ENCOUNTER — Other Ambulatory Visit: Payer: Self-pay | Admitting: Oncology

## 2012-02-04 ENCOUNTER — Encounter: Payer: Self-pay | Admitting: Oncology

## 2012-02-04 ENCOUNTER — Encounter: Payer: Self-pay | Admitting: *Deleted

## 2012-02-04 ENCOUNTER — Ambulatory Visit (HOSPITAL_BASED_OUTPATIENT_CLINIC_OR_DEPARTMENT_OTHER): Payer: Medicare Other | Admitting: Oncology

## 2012-02-04 ENCOUNTER — Other Ambulatory Visit: Payer: Self-pay | Admitting: *Deleted

## 2012-02-04 ENCOUNTER — Other Ambulatory Visit (HOSPITAL_BASED_OUTPATIENT_CLINIC_OR_DEPARTMENT_OTHER): Payer: Medicare Other | Admitting: Lab

## 2012-02-04 ENCOUNTER — Other Ambulatory Visit: Payer: Self-pay

## 2012-02-04 ENCOUNTER — Telehealth: Payer: Self-pay | Admitting: *Deleted

## 2012-02-04 ENCOUNTER — Telehealth: Payer: Self-pay | Admitting: Oncology

## 2012-02-04 VITALS — BP 125/74 | HR 87 | Temp 98.6°F | Resp 20 | Ht 64.0 in | Wt 136.4 lb

## 2012-02-04 DIAGNOSIS — C801 Malignant (primary) neoplasm, unspecified: Secondary | ICD-10-CM

## 2012-02-04 DIAGNOSIS — M84453A Pathological fracture, unspecified femur, initial encounter for fracture: Secondary | ICD-10-CM

## 2012-02-04 DIAGNOSIS — C7951 Secondary malignant neoplasm of bone: Secondary | ICD-10-CM

## 2012-02-04 DIAGNOSIS — M810 Age-related osteoporosis without current pathological fracture: Secondary | ICD-10-CM

## 2012-02-04 DIAGNOSIS — C50919 Malignant neoplasm of unspecified site of unspecified female breast: Secondary | ICD-10-CM

## 2012-02-04 DIAGNOSIS — C787 Secondary malignant neoplasm of liver and intrahepatic bile duct: Secondary | ICD-10-CM

## 2012-02-04 DIAGNOSIS — C50419 Malignant neoplasm of upper-outer quadrant of unspecified female breast: Secondary | ICD-10-CM

## 2012-02-04 DIAGNOSIS — C50912 Malignant neoplasm of unspecified site of left female breast: Secondary | ICD-10-CM

## 2012-02-04 DIAGNOSIS — C7952 Secondary malignant neoplasm of bone marrow: Secondary | ICD-10-CM

## 2012-02-04 LAB — CBC WITH DIFFERENTIAL/PLATELET
Eosinophils Absolute: 0.3 10*3/uL (ref 0.0–0.5)
HCT: 34.9 % (ref 34.8–46.6)
HGB: 12.1 g/dL (ref 11.6–15.9)
LYMPH%: 27.8 % (ref 14.0–49.7)
MONO#: 0.5 10*3/uL (ref 0.1–0.9)
NEUT#: 2.7 10*3/uL (ref 1.5–6.5)
NEUT%: 55.4 % (ref 38.4–76.8)
Platelets: 200 10*3/uL (ref 145–400)
WBC: 4.9 10*3/uL (ref 3.9–10.3)

## 2012-02-04 LAB — COMPREHENSIVE METABOLIC PANEL (CC13)
CO2: 23 mEq/L (ref 22–29)
Creatinine: 0.8 mg/dL (ref 0.6–1.1)
Glucose: 117 mg/dl — ABNORMAL HIGH (ref 70–99)
Sodium: 140 mEq/L (ref 136–145)
Total Bilirubin: 0.55 mg/dL (ref 0.20–1.20)
Total Protein: 6.9 g/dL (ref 6.4–8.3)

## 2012-02-04 LAB — LIPID PANEL
Cholesterol: 292 mg/dL — ABNORMAL HIGH (ref 0–200)
VLDL: 57 mg/dL — ABNORMAL HIGH (ref 0–40)

## 2012-02-04 LAB — URIC ACID (CC13): Uric Acid, Serum: 3 mg/dl (ref 2.6–7.4)

## 2012-02-04 LAB — RESEARCH LABS

## 2012-02-04 LAB — PHOSPHORUS: Phosphorus: 2.6 mg/dL (ref 2.3–4.6)

## 2012-02-04 MED ORDER — INV-EVEROLIMUS (RAD0001) 5MG TABLET NOVARTIS CRAD001Y24135: 1.0000 | ORAL_TABLET | Freq: Every day | ORAL | Status: DC

## 2012-02-04 NOTE — Telephone Encounter (Signed)
Per staff phone call and POF I have scheduled appt. JWM  

## 2012-02-04 NOTE — Telephone Encounter (Signed)
appts made and printed for pt Pt aware that ce. Sch. Will call with appts

## 2012-02-04 NOTE — Patient Instructions (Signed)
Let us know if more short of breath, but it could all be from deconditioning as your lungs and heart sound good and labs look great.

## 2012-02-04 NOTE — Progress Notes (Signed)
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 She is tolerating all of the treatment very well presently.  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 She was treated with lumpectomy and 18 node axillary node dissection, adjuvant adriamycin/cytoxan followed by [facility] chemotherapy, local radiation, tamoxifen from April 2003 thru Dec 2003 then arimidex from Dec 2004 thru June 2011. The arimidex had been continued for that long duration due to high risk features, stopped as there had been no evidence of disease and with known osteoporosis. She was clinically stable and labs were normal other than very slight decrease in hemoglobin when I saw her in Jan 2013. She developed acute pain in left hip in mid June which did not respond to pain medication by rheumatologist, then MRI hip 09-20-11 showed metastatic disease with pathologic fracture of femoral neck. She was admitted 7-3 thru 09-26-11, with nailing by Dr Victorino Dike on 09-24-11. Pathology from the surgery confirmed metastatic breast carcinoma, ER+ 100%, PR negative and HER 2 negative by [facility]. She has left ureteral stent by Dr Retta Diones 11-01-11 for obstructive hydronephrosis. She received radiation 3000 cGy in 10 fractions to left femur 7-24 thru 10-28-11 by Dr Roselind Messier. Bolero 4 study required waiting 2 weeks beyond completion of RT to begin, and was started 11-12-11. Everolimus dose was reduced due to epistaxis; stomatitis has not been a problem on present dose. Restaging scans per protocol were done on 01-05-12, with no  new metastatic disease chest, abdomen or pelvis, improvement in multiple liver lesions and in right femoral neck lesion by CT, tho bone scan looked stable. CT chest compared with 09-24-11 scan (? not with 11-06-11 scan) shows posterior right rib fractures and superior endplate compression of an upper thoracic vertebrae. On protocol she will be due scans again shortly prior to Dec 11. She saw Dr Retta Diones recently and he is using her other imaging studies rather than [place] at his office to follow stent.   Patient has been more active at home, ambulatory now ~ half of day and otherwise in chair, goes up and down stairs at home now 3-4 x daily, goes outside with dogs and went to [employer] at [employer]. She has some shortness of breath with exertion such as cooking or dusting, none at rest, not any worse than previously. She has had no cough and no chest pain. She has intermittent soreness in left femur and slight left pedal edema at hs which resolves by morning, no rash, no swelling in upper leg. She is using half of 5-325 hydrocodone ~ 3x daily for right low back or flank discomfort, with good relief. Bowels are moving every other day and she is not uncomfortable from this. Appetite is good. She has had no epistaxis or other bleeding. She has had no fever or symptoms of infection. Peripheral IV access has been a little more difficult at times, however she still prefers this to [facility] yet (access limited to RUE). Remainder of 10 point Review of Systems negative.  ECOG PS 1-2 Objective:  Vital signs in last 24 hours:  BP 125/74  Pulse  87  Temp 98.6 F (37 C) (Oral)  Resp 20  Ht 5\' 4"  (1.626 m)  Wt 136 lb 6.4 oz (61.871 kg)  BMI 23.41 kg/m2 Weight is down 0.5 lb. Alert, looks comfortable, easily mobile in exam room including sitting up easily from supine position on exam table. Respirations not labored RA. Husband very supportive as always.   HEENT:PERRLA, sclera clear, anicteric,  oropharynx clear, no lesions and neck supple with midline trachea.and no thyroid mass.  Nares not remarkable, no epistaxis.  LymphaticsCervical, supraclavicular, and axillary nodes normal.No inguinal adenopathy Resp: clear to auscultation bilaterally and normal percussion bilaterally. No use of accessory muscles.  Back nontender to palpation including right flank Cardio: regular rate and rhythm no gallop, clear heart sounds GI: soft, non-tender; bowel sounds normal; no masses,  no organomegaly Extremities: UE and RLE without edema, cords, tenderness. LLE no rash or swelling thigh or lower leg, no tenderness to palpation along femur.  Neuro:speech fluent and appropriate. CN, motor, sensory, cerebellar nonfocal Breasts: left with superior lumpectomy with tissue defect and scarring as previously, no dominant mass, no skin or nipple findings, nothing in left axilla, no swelling LUE. Right without dominant mass, skin or nipple findings, axilla benign. Skin without rash, ecchymosis, petechiae Vascular: peripheral pulses symmetric Lab Results:  Results for orders placed in visit on 02/04/12  CBC WITH DIFFERENTIAL      Component Value Range   WBC 4.9  3.9 - 10.3 10e3/uL   NEUT# 2.7  1.5 - 6.5 10e3/uL   HGB 12.1  11.6 - 15.9 g/dL   HCT 11.9  14.7 - 82.9 %   Platelets 200  145 - 400 10e3/uL   MCV 90.7  79.5 - 101.0 fL   MCH 31.3  25.1 - 34.0 pg   MCHC 34.6  31.5 - 36.0 g/dL   RBC 5.62  1.30 - 8.65 10e6/uL   RDW 14.4  11.2 - 14.5 %   lymph# 1.4  0.9 - 3.3 10e3/uL   MONO# 0.5  0.1 - 0.9 10e3/uL   Eosinophils Absolute 0.3  0.0 - 0.5 10e3/uL   Basophils Absolute 0.0  0.0 - 0.1 10e3/uL   NEUT% 55.4  38.4 - 76.8 %   LYMPH% 27.8  14.0 - 49.7 %   MONO% 10.3  0.0 - 14.0 %   EOS% 5.9  0.0 - 7.0 %   BASO% 0.6  0.0 - 2.0 %  COMPREHENSIVE METABOLIC PANEL (CC13)      Component Value Range   Sodium 140  136 - 145 mEq/L   Potassium 3.4 (*) 3.5 - 5.1 mEq/L   Chloride 108 (*) 98 - 107 mEq/L   CO2 23  22  - 29 mEq/L   Glucose 117 (*) 70 - 99 mg/dl   BUN 78.4  7.0 - 69.6 mg/dL   Creatinine 0.8  0.6 - 1.1 mg/dL   Total Bilirubin 2.95  0.20 - 1.20 mg/dL   Alkaline Phosphatase 86  40 - 150 U/L   AST 36 (*) 5 - 34 U/L   ALT 19  0 - 55 U/L   Total Protein 6.9  6.4 - 8.3 g/dL   Albumin 3.4 (*) 3.5 - 5.0 g/dL   Calcium 9.6  8.4 - 28.4 mg/dL  PHOSPHORUS      Component Value Range   Phosphorus 2.6  2.3 - 4.6 mg/dL  URIC ACID (XL24)      Component Value Range   Uric Acid, Serum 3.0  2.6 - 7.4 mg/dl  CK      Component Value Range   Total CK 55  7 - 177 U/L  BILIRUBIN, DIRECT      Component Value Range   Bilirubin, Direct 0.1  0.0 - 0.3 mg/dL  RESEARCH LABS      Component Value Range   Research Labs Collected.    Will ask patient to increase potassium and protein in diet; chloride not of concern, will follow glucose (labs were fasting) just minimally elevated and not symptomatic now. Lipids pending  Studies/Results: CT chest, MRI AP and bone scan due ~ 12-6 and 03-01-12, Research to set up Medications: I have reviewed the patient's current medications. No changes. She has not wanted flu vaccine.  Assessment/Plan: 1.Metastatic hormone positive breast cancer: clinically doing well on everolimus, letrozole and zometa, which she will continue. Scans per protocol shortly prior to next MD visit 03-03-12. 2.possible rash from IV contrast with last scans: will premed with oral benadryl 25 mg 1-2 hrs prior to scans 3.left ureteral stent in  4.osteoporosis: predated present aromatase inhibitor, now also on IV bisphosphonate 5.history of anxiety/depression/ insomnia 6. DNR patient's request but does want treatment short of that.   LIVESAY,LENNIS P, MD   02/04/2012, 11:42 AM

## 2012-02-04 NOTE — Progress Notes (Signed)
ENCOUNTER OPENED IN ERROR

## 2012-02-04 NOTE — Progress Notes (Signed)
02/04/12 at 10:38am [employer]/Bolero 4 cycle 4, day 1 study notes - The pt was into the clinic this am for her cycle 4, day 1 assessments.  The pt confirmed that she was fasting overnight and this am prior to her required blood draw. The pt's cycle 4 biomarker was drawn today.  The pt returned her cycle 3 everolimus study drug kit.  She reported that she took everolimus 5 mg daily from 01/06/12 through 02/03/12 (28 days=28 doses).  She returned 42 unopened blister packs of everolimus.  The pt was prescribed 5 mg of everolimus to be taken daily for cycle 3.  Therefore, the pt was 100% compliant in her daily dosing (70 pills dispensed - 42 returned = 28 pills taken).  The pt also confirmed that she has taken letrozole 2.5 mg daily along with the everolimus.  The pt was dispensed her next everolimus study drug kit today for self administration.  She was reminded to continue taking 1 (5mg ) tablet of everolimus daily along with the letrozole 2.5mg  tablet.  She started her cycle 4 dosing today while in the clinic.  The pt's BP was taken 3 times after the pt was seated for at least 5 minutes.  The pt's weight and vitals were stable.  The pt's current medications were reviewed by Dr. Precious Reel nurse, Sallye Ober.  The research nurse also reviewed the concomitant medication log with the pt and her husband.  The pt reports the following as ongoing medications:  Prozac, Synthroid, Ativan, Ambien, Zometa, Dexamethasone  (if needed), Sodium Chloride nasal spray, and Benadryl (to be taken before next CT).  The pt reports that she has restarted her hydrocodone (Norco) for some back/flank discomfort.  The pt denies any bowel and bladder problems.  She specifically denies any constipation and diarrhea.  She states she has ongoing dyspnea on exertion (reported at baseline).  She denies any cough.  She does report some ongoing mild fatigue.  She also denies any tingling/numbness in her hands and feet.  She also reported some intermittent  nausea (aka "stomach ache") that began on 01/12/12.  The pt was seen and examined today by Dr. Darrold Span.  Dr. Darrold Span reviewed the pt's following labs:  Cbc/diff, cmet, phosphorus, CK. Direct bilirubin, and uric acid.   Dr. Darrold Span said she was pleased with these lab results.  She said that no intervention was needed on the abnormal lab values.  She said the following lab abnormals are not clinically significant:  Potassium, glucose, chloride, AST, and albumin.  The pt's lipid panel is still pending.  The pt is aware that her values are trending upwards, and she may need some new medication to lower her cholesterol and triglycerides.  The pt uses a cane for stability, but she states she is able to perform most of her usual activities.  ECOG=1.  The pt informed that she will need scans done prior to her cycle 5 treatment.  Dr. Darrold Span encouraged the pt to take Benadryl 25 mg po a couple of hours before her CT scan in December since she developed a rash following her last set of scans.  The pt was given her cycle 5 appts at cancer center.    02/04/12 at 3:32pm- The pt's lipid panel results revealed grade 1 toxicities with regards to her cholesterol and triglycerides.  Dr. Darrold Span asked the research nurse to inform the pt to watch her diet and that she does not need any medications added currently.  The nurse left a message asking  the pt to return the nurse's call.    02/05/12 at 10:07am  The nurse contacted the pt about her lipid panel.  The pt was instructed that no medical intervention is needed at this time.  The pt was advised to watch her diet closely.  The pt was also told to eat some bananas and drink some orange juice for her potassium.  The pt verbalized understanding. The pt also reported that she woke up this am with a nosebleed.  She said the bleeding has stopped for now.  The pt was encouraged to call her ENT physician if the nosebleed were to worsen.  The nurse will see the pt at her next office  visit.

## 2012-02-07 ENCOUNTER — Other Ambulatory Visit: Payer: Self-pay | Admitting: Oncology

## 2012-02-09 ENCOUNTER — Ambulatory Visit (HOSPITAL_BASED_OUTPATIENT_CLINIC_OR_DEPARTMENT_OTHER): Payer: Medicare Other

## 2012-02-09 ENCOUNTER — Ambulatory Visit: Payer: Medicare Other

## 2012-02-09 VITALS — BP 121/71 | HR 90 | Temp 97.9°F

## 2012-02-09 DIAGNOSIS — C50419 Malignant neoplasm of upper-outer quadrant of unspecified female breast: Secondary | ICD-10-CM

## 2012-02-09 DIAGNOSIS — C7951 Secondary malignant neoplasm of bone: Secondary | ICD-10-CM

## 2012-02-09 MED ORDER — ZOLEDRONIC ACID 4 MG/5ML IV CONC
4.0000 mg | Freq: Once | INTRAVENOUS | Status: AC
  Administered 2012-02-09: 4 mg via INTRAVENOUS
  Filled 2012-02-09: qty 5

## 2012-02-09 MED ORDER — SODIUM CHLORIDE 0.9 % IV SOLN
Freq: Once | INTRAVENOUS | Status: AC
  Administered 2012-02-09: 15:00:00 via INTRAVENOUS

## 2012-02-09 NOTE — Patient Instructions (Addendum)
Zoledronic Acid injection (Paget's Disease, Osteoporosis) What is this medicine? ZOLEDRONIC ACID (ZOE le dron ik AS id) lowers the amount of calcium loss from bone. It is used to treat Paget's disease and osteoporosis in women. This medicine may be used for other purposes; ask your health care provider or pharmacist if you have questions. What should I tell my health care provider before I take this medicine? They need to know if you have any of these conditions: -aspirin-sensitive asthma -dental disease -kidney disease -low levels of calcium in the blood -past surgery on the parathyroid gland or intestines -an unusual or allergic reaction to zoledronic acid, other medicines, foods, dyes, or preservatives -pregnant or trying to get pregnant -breast-feeding How should I use this medicine? This medicine is for infusion into a vein. It is given by a health care professional in a hospital or clinic setting. Talk to your pediatrician regarding the use of this medicine in children. This medicine is not approved for use in children. Overdosage: If you think you have taken too much of this medicine contact a poison control center or emergency room at once. NOTE: This medicine is only for you. Do not share this medicine with others. What if I miss a dose? It is important not to miss your dose. Call your doctor or health care professional if you are unable to keep an appointment. What may interact with this medicine? -certain antibiotics given by injection -NSAIDs, medicines for pain and inflammation, like ibuprofen or naproxen -some diuretics like bumetanide, furosemide -teriparatide This list may not describe all possible interactions. Give your health care provider a list of all the medicines, herbs, non-prescription drugs, or dietary supplements you use. Also tell them if you smoke, drink alcohol, or use illegal drugs. Some items may interact with your medicine. What should I watch for while  using this medicine? Visit your doctor or health care professional for regular checkups. It may be some time before you see the benefit from this medicine. Do not stop taking your medicine unless your doctor tells you to. Your doctor may order blood tests or other tests to see how you are doing. Women should inform their doctor if they wish to become pregnant or think they might be pregnant. There is a potential for serious side effects to an unborn child. Talk to your health care professional or pharmacist for more information. You should make sure that you get enough calcium and vitamin D while you are taking this medicine. Discuss the foods you eat and the vitamins you take with your health care professional. Some people who take this medicine have severe bone, joint, and/or muscle pain. This medicine may also increase your risk for a broken thigh bone. Tell your doctor right away if you have pain in your upper leg or groin. Tell your doctor if you have any pain that does not go away or that gets worse. What side effects may I notice from receiving this medicine? Side effects that you should report to your doctor or health care professional as soon as possible: -allergic reactions like skin rash, itching or hives, swelling of the face, lips, or tongue -breathing problems -changes in vision -feeling faint or lightheaded, falls -jaw burning, cramping, or pain -muscle cramps, stiffness, or weakness -trouble passing urine or change in the amount of urine Side effects that usually do not require medical attention (report to your doctor or health care professional if they continue or are bothersome): -bone, joint, or muscle pain -fever -  irritation at site where injected -loss of appetite -nausea, vomiting -stomach upset -tired This list may not describe all possible side effects. Call your doctor for medical advice about side effects. You may report side effects to FDA at 1-800-FDA-1088. Where  should I keep my medicine? This drug is given in a hospital or clinic and will not be stored at home. NOTE: This sheet is a summary. It may not cover all possible information. If you have questions about this medicine, talk to your doctor, pharmacist, or health care provider.  2013, [employer]. (09/06/2010 9:08:15 AM)  

## 2012-02-12 ENCOUNTER — Other Ambulatory Visit: Payer: Self-pay | Admitting: *Deleted

## 2012-02-12 DIAGNOSIS — C50419 Malignant neoplasm of upper-outer quadrant of unspecified female breast: Secondary | ICD-10-CM

## 2012-02-12 MED ORDER — HYDROCODONE-ACETAMINOPHEN 5-325 MG PO TABS: ORAL_TABLET | ORAL | Status: DC

## 2012-02-12 NOTE — Telephone Encounter (Signed)
PT. WAS GIVEN ONE ADDITIONAL REFILL.

## 2012-02-25 ENCOUNTER — Other Ambulatory Visit (HOSPITAL_COMMUNITY): Payer: Medicare Other

## 2012-02-27 ENCOUNTER — Ambulatory Visit (HOSPITAL_COMMUNITY)
Admission: RE | Admit: 2012-02-27 | Discharge: 2012-02-27 | Disposition: A | Discharge status: Home / Self Care | Payer: Medicare Other | Source: Ambulatory Visit | Attending: Oncology | Admitting: Oncology

## 2012-02-27 DIAGNOSIS — C7951 Secondary malignant neoplasm of bone: Secondary | ICD-10-CM

## 2012-02-27 DIAGNOSIS — C801 Malignant (primary) neoplasm, unspecified: Secondary | ICD-10-CM | POA: Insufficient documentation

## 2012-02-27 MED ORDER — GADOBENATE DIMEGLUMINE 529 MG/ML IV SOLN
12.0000 mL | Freq: Once | INTRAVENOUS | Status: AC | PRN
  Administered 2012-02-27: 12 mL via INTRAVENOUS

## 2012-02-27 MED ORDER — IOHEXOL 300 MG/ML  SOLN
80.0000 mL | Freq: Once | INTRAMUSCULAR | Status: AC | PRN
  Administered 2012-02-27: 80 mL via INTRAVENOUS

## 2012-03-01 ENCOUNTER — Encounter (HOSPITAL_COMMUNITY)
Admission: RE | Admit: 2012-03-01 | Discharge: 2012-03-01 | Disposition: A | Discharge status: Home / Self Care | Payer: Medicare Other | Source: Ambulatory Visit | Attending: Oncology | Admitting: Oncology

## 2012-03-01 DIAGNOSIS — C7951 Secondary malignant neoplasm of bone: Secondary | ICD-10-CM

## 2012-03-01 DIAGNOSIS — C50919 Malignant neoplasm of unspecified site of unspecified female breast: Secondary | ICD-10-CM | POA: Insufficient documentation

## 2012-03-01 MED ORDER — TECHNETIUM TC 99M MEDRONATE IV KIT
25.0000 | PACK | Freq: Once | INTRAVENOUS | Status: AC | PRN
  Administered 2012-03-01: 25 via INTRAVENOUS

## 2012-03-03 ENCOUNTER — Telehealth: Payer: Self-pay | Admitting: Oncology

## 2012-03-03 ENCOUNTER — Ambulatory Visit (HOSPITAL_COMMUNITY)
Admission: RE | Admit: 2012-03-03 | Discharge: 2012-03-03 | Disposition: A | Discharge status: Home / Self Care | Payer: Medicare Other | Source: Ambulatory Visit | Attending: Oncology | Admitting: Oncology

## 2012-03-03 ENCOUNTER — Encounter (HOSPITAL_COMMUNITY): Payer: Self-pay

## 2012-03-03 ENCOUNTER — Other Ambulatory Visit: Payer: Self-pay | Admitting: Oncology

## 2012-03-03 ENCOUNTER — Telehealth: Payer: Self-pay

## 2012-03-03 ENCOUNTER — Encounter: Payer: Self-pay | Admitting: *Deleted

## 2012-03-03 ENCOUNTER — Ambulatory Visit (HOSPITAL_BASED_OUTPATIENT_CLINIC_OR_DEPARTMENT_OTHER): Payer: Medicare Other | Admitting: Oncology

## 2012-03-03 ENCOUNTER — Encounter (HOSPITAL_COMMUNITY)
Admission: RE | Admit: 2012-03-03 | Discharge: 2012-03-03 | Disposition: A | Discharge status: Home / Self Care | Payer: Medicare Other | Source: Ambulatory Visit | Attending: Oncology | Admitting: Oncology

## 2012-03-03 ENCOUNTER — Other Ambulatory Visit (HOSPITAL_BASED_OUTPATIENT_CLINIC_OR_DEPARTMENT_OTHER): Payer: Medicare Other | Admitting: Lab

## 2012-03-03 ENCOUNTER — Other Ambulatory Visit: Payer: Self-pay | Admitting: *Deleted

## 2012-03-03 VITALS — BP 125/69 | HR 80 | Temp 98.0°F | Resp 28

## 2012-03-03 DIAGNOSIS — M810 Age-related osteoporosis without current pathological fracture: Secondary | ICD-10-CM

## 2012-03-03 DIAGNOSIS — C50919 Malignant neoplasm of unspecified site of unspecified female breast: Secondary | ICD-10-CM

## 2012-03-03 DIAGNOSIS — C50419 Malignant neoplasm of upper-outer quadrant of unspecified female breast: Secondary | ICD-10-CM

## 2012-03-03 DIAGNOSIS — C787 Secondary malignant neoplasm of liver and intrahepatic bile duct: Secondary | ICD-10-CM

## 2012-03-03 DIAGNOSIS — Z91041 Radiographic dye allergy status: Secondary | ICD-10-CM | POA: Insufficient documentation

## 2012-03-03 DIAGNOSIS — C7951 Secondary malignant neoplasm of bone: Secondary | ICD-10-CM

## 2012-03-03 DIAGNOSIS — R0602 Shortness of breath: Secondary | ICD-10-CM | POA: Insufficient documentation

## 2012-03-03 DIAGNOSIS — M7989 Other specified soft tissue disorders: Secondary | ICD-10-CM

## 2012-03-03 LAB — CBC WITH DIFFERENTIAL/PLATELET
Basophils Absolute: 0.1 10*3/uL (ref 0.0–0.1)
HCT: 36.6 % (ref 34.8–46.6)
HGB: 12.3 g/dL (ref 11.6–15.9)
MONO#: 0.5 10*3/uL (ref 0.1–0.9)
NEUT#: 3.5 10*3/uL (ref 1.5–6.5)
NEUT%: 64.2 % (ref 38.4–76.8)
WBC: 5.5 10*3/uL (ref 3.9–10.3)
lymph#: 1.1 10*3/uL (ref 0.9–3.3)

## 2012-03-03 LAB — COMPREHENSIVE METABOLIC PANEL (CC13)
ALT: 20 U/L (ref 0–55)
Albumin: 3.3 g/dL — ABNORMAL LOW (ref 3.5–5.0)
BUN: 12 mg/dL (ref 7.0–26.0)
CO2: 24 mEq/L (ref 22–29)
Calcium: 9.5 mg/dL (ref 8.4–10.4)
Chloride: 109 mEq/L — ABNORMAL HIGH (ref 98–107)
Creatinine: 0.8 mg/dL (ref 0.6–1.1)
Potassium: 3.7 mEq/L (ref 3.5–5.1)

## 2012-03-03 LAB — CANCER ANTIGEN 27.29: CA 27.29: 224 U/mL — ABNORMAL HIGH (ref 0–39)

## 2012-03-03 LAB — LIPID PANEL
HDL: 53 mg/dL (ref >39)
Triglycerides: 304 mg/dL — ABNORMAL HIGH (ref <150)

## 2012-03-03 LAB — URIC ACID (CC13): Uric Acid, Serum: 3.1 mg/dl (ref 2.6–7.4)

## 2012-03-03 LAB — BILIRUBIN, DIRECT: Bilirubin, Direct: 0.2 mg/dL (ref 0.0–0.3)

## 2012-03-03 LAB — CK: Total CK: 56 U/L (ref 7–177)

## 2012-03-03 MED ORDER — TECHNETIUM TC 99M DIETHYLENETRIAME-PENTAACETIC ACID: 48.0000 | Freq: Once | INTRAVENOUS | Status: DC | PRN

## 2012-03-03 MED ORDER — TECHNETIUM TO 99M ALBUMIN AGGREGATED
5.2000 | Freq: Once | INTRAVENOUS | Status: AC | PRN
  Administered 2012-03-03: 5.2 via INTRAVENOUS

## 2012-03-03 NOTE — Progress Notes (Signed)
*  PRELIMINARY RESULTS* Vascular Ultrasound Lower extremity venous duplex has been completed.  Preliminary findings: Bilateral:  No evidence of DVT, superficial thrombosis, or Baker's Cyst.    Farrel Demark, RDMS, RVT 03/03/2012, 1:17 PM

## 2012-03-03 NOTE — Patient Instructions (Signed)
LE venous dopplers now; we may need another lung scan and/or pulmonary doctor to see. We will get echocardiogram.   We will get home oxygen that you can use if you are short of breath.  Stop everolimus and letrozole for now.

## 2012-03-03 NOTE — Telephone Encounter (Signed)
Faxed order, O2 sat, demographics, to [facility] for home O2 @ 2 liters [place].

## 2012-03-03 NOTE — Telephone Encounter (Signed)
appts made by louise and pul. Ref made by anne and all appts printed for pt aom

## 2012-03-03 NOTE — Progress Notes (Signed)
03/03/12 at 1:50pm - [employer] 4, cycle 5, day 1 study notes - The pt was into the cancer center today for her cycle 5, day 1 assessments.  The pt stated that she was not "having a good day".  She confirmed that she has been fasting overnight and this morning prior to her lab work.  Her vitals (BP's x 3) were taken after the pt was seated for 5 minutes.  The pt complained of increased dyspnea (symptomatic, interfering with ADL's, and needs home oxygen)- Grade 3.  The pt was seen and evalulated by Dr. Darrold Span.  The pt's ECOG today is 2 (she is able to perform her self-care but is limited in her activity due to her dyspnea).  The pt was ordered home oxygen.  Dr. Darrold Span wanted to do some scans to rule out pneumonitis.  The pt was instructed to hold her everolimus study drug along with her letrozole until further notice. The pt was told that her recent scans still show a partial response to treatment.  The pt returned her cycle 4 everolimus study drug kit today.  The pt confirmed that she took 1 pill (everolimus 5 mg tablet) from 02/04/12 through 03/02/12 (28 days = 28 doses).  The pt was dispensed 70 tablets, and she returned 42 unopened blister packs of everolimus today.  Therefore, the pt has been 100% compliant with her everolimus study drug kit.  The research nurse also went over the pt's concomitant medication log with the pt.  The pt denied any changes in her medications.  She confirmed that she is still taking the following medications:  Prozac, synthroid, ativan, ambien, Zometa, dexamethasone oral solutions prn (not needed per pt report), sodium chloride nasal spray prn, benadryl prn, and hydrocodone prn.  The monitor, Alleen Borne, was notified that the pt is being evaluated for pneumonitis and that her study treatment is on hold for now.  The pt's cycle 5 biomarker sample was obtained this am.  The pt will be evaluated next week on 03/09/12 for an unscheduled visit.     03/04/12 at 10:48am - The  research nurse called the pt to check on her status.  The pt said that she is fatigued from her "long day yesterday".  She stated that [facility] has not delivered her home oxygen yet.  She said that she has received a call from them stating it should be delivered today.  She states she is not short of breathe at rest.  She states she "gets winded" when she starts moving about around the house.  She requested that her Zometa appt be moved to Tuesday after her MD appt for convenience reasons.  The pt was encouraged to call the [facility] for any problems or concerns.  The pt verbalized understanding.    03/09/12 at 11:10am - unscheduled cycle 5 visit for follow up- The pt was into the cancer center this morning for follow up.  She has not taken her study drugs (letrozole and everolimus) since 03/02/12.  The physician decided to withhold both drugs until the source of her dyspnea could be determined.  All of the pt's tests have been negative for any clots, PE, and cardiac problems.  The pt reports she is feeling much better and is not as short of breathe now.  The pt was seen and examined today by Dr. Darrold Span.  Dr. Darrold Span instructed the pt to resume her letrozole today.  However, the physician wants to continue to hold the everolimus  until the pt is seen by a pulmonologist on 03/19/12.  The physician said then she will probably dose reduce to 5 mg every other day.  The pt's O2 saturations have also improved from last week.  The pt was told that her next scheduled appt should be for cycle 6 on 03/31/12.  The research nurse will continue to monitor the pt's status off of her everolimus.  The research nurse will also update Alleen Borne, CRA for [employer], that the pt's study drug is continued to be held.    03/25/12 at 9:04am - The pt's study drug, everolimus, is still on hold.  The pt was seen for consultation with the pulmonologist, Dr. Craige Cotta, on 03/19/12.  He ordered a "bubble study" with a limited echo on 03/25/12.   The test did not show any evidence of a shunt.  The pt is scheduled to see Dr. Darrold Span on 03/31/12 for her cycle 6 evaluation and assessments.    03/30/12 at 1:04pm - The research nurse spoke to the pt's husband, Nadine Counts, and advised him that his wife needs to be fasting for tomorrow's labs.  He verbalized understanding.  The pt's pulmonologist has ordered pulmonary function tests for the pt before his next office visit.

## 2012-03-04 ENCOUNTER — Telehealth: Payer: Self-pay | Admitting: *Deleted

## 2012-03-04 NOTE — Telephone Encounter (Signed)
Per staff voicemail from the research RN I have moved appt from 12/16 to 12/17.  JMW

## 2012-03-04 NOTE — Telephone Encounter (Signed)
[place] had called earlier from [facility] stating that pt's insurance would not cover her home O2 without another diagnosis such as COPD, lung cancer, asthma, chronic bronchitis. Discussed with Dr Darrold Span. Pt has metastatic breast cancer, while at office visit with Dr Darrold Span, her O2 sats dropped from 92% to 88% after walking ~ 5-8 steps. RN left a message for [facility] with above information.

## 2012-03-05 ENCOUNTER — Ambulatory Visit (HOSPITAL_COMMUNITY)
Admission: RE | Admit: 2012-03-05 | Discharge: 2012-03-05 | Disposition: A | Discharge status: Home / Self Care | Payer: Medicare Other | Source: Ambulatory Visit | Attending: Oncology | Admitting: Oncology

## 2012-03-05 DIAGNOSIS — I059 Rheumatic mitral valve disease, unspecified: Secondary | ICD-10-CM

## 2012-03-05 DIAGNOSIS — D649 Anemia, unspecified: Secondary | ICD-10-CM | POA: Insufficient documentation

## 2012-03-05 DIAGNOSIS — R609 Edema, unspecified: Secondary | ICD-10-CM | POA: Insufficient documentation

## 2012-03-05 DIAGNOSIS — R0989 Other specified symptoms and signs involving the circulatory and respiratory systems: Secondary | ICD-10-CM | POA: Insufficient documentation

## 2012-03-05 DIAGNOSIS — C50919 Malignant neoplasm of unspecified site of unspecified female breast: Secondary | ICD-10-CM | POA: Insufficient documentation

## 2012-03-05 DIAGNOSIS — R0609 Other forms of dyspnea: Secondary | ICD-10-CM | POA: Insufficient documentation

## 2012-03-05 DIAGNOSIS — E039 Hypothyroidism, unspecified: Secondary | ICD-10-CM | POA: Insufficient documentation

## 2012-03-05 DIAGNOSIS — C787 Secondary malignant neoplasm of liver and intrahepatic bile duct: Secondary | ICD-10-CM | POA: Insufficient documentation

## 2012-03-05 NOTE — Progress Notes (Signed)
  Echocardiogram 2D Echocardiogram has been performed.  Cathie Beams 03/05/2012, 2:08 PM

## 2012-03-07 ENCOUNTER — Encounter: Payer: Self-pay | Admitting: Oncology

## 2012-03-07 NOTE — Progress Notes (Signed)
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 She had restaging scans per the protocol prior to this visit.  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 She was treated with lumpectomy and 18 node axillary node dissection, adjuvant adriamycin/cytoxan followed by CMF chemotherapy, local radiation, tamoxifen from April 2003 thru Dec 2003 then arimidex from Dec 2004 thru June 2011. The arimidex had been continued for that long duration due to high risk features, stopped as there had been no evidence of disease and with known osteoporosis. She was clinically stable and labs were normal other than very slight decrease in hemoglobin when I saw her in Jan 2013. She developed acute pain in left hip in mid June which did not respond to pain medication by rheumatologist, then MRI hip 09-20-11 showed metastatic disease with pathologic fracture of femoral neck. She was admitted 7-3 thru 09-26-11, with nailing by Dr Victorino Dike on 09-24-11. Pathology from the surgery confirmed metastatic breast carcinoma, ER+ 100%, PR negative and HER 2 negative by [facility]. She has left ureteral stent by Dr Retta Diones 11-01-11 for obstructive hydronephrosis. She received radiation 3000 cGy in 10 fractions to left femur 7-24 thru 10-28-11 by Dr Roselind Messier. Bolero 4 study required waiting 2 weeks beyond completion of RT to begin, and was started 11-12-11. Everolimus dose was reduced due to epistaxis; stomatitis has not been a problem on present dose. Scans detailed below show further improvement in disease, without new areas or other  progression.    Patient has had increased shortness of breath with minimal exertion such as walking in house, improves with rest. She has no cough, no chest pain, very slight ankle swelling, no fever or other symptoms of infection, no sinus congestion or drainage. She had slight epistaxis this am, stopped with Afrin spray x 2, no other bleeding. She has felt more pain from back around to abdomen, controlled with 2.5 - 3 hydrocodone 5/325 in 24 hours. She denies other specific pain. She had pruitic rash on arms and legs after CT contrast, improved with benadryl, no respiratory symptoms with this. Appetite is fairly good. She has had some minimal swelling in LE without tenderness. Remainder of 10 point Review of Systems negative.  Objective:  Vital signs in last 24 hours:  BP 125/69  Pulse 80  Temp 98 F (36.7 C) (Oral)  Resp 28 O2 sat 92% seated and dropped to 88% walking short distance, with patient obviously dyspneic then. Serial vitals done today per protocol   HEENT:PERRLA, extra ocular movement intact, sclera clear, anicteric and oropharynx clear, no lesions. No alopecia LymphaticsCervical, supraclavicular, and axillary nodes normal. Resp: respiratory rate increased, no cough, no use of accessory muscles. Lungs clear to A & P Cardio: regular rate and rhythm, no gallop, clear heart sounds GI: soft, non-tender; bowel sounds normal; no masses,  no organomegaly Extremities: trace pedal edema without cords or tenderness Neuro:no sensory deficits noted Skin without rash or ecchymosis  ECOG PS 2-3 Lab Results:  Results for orders placed in visit on 03/03/12  CBC WITH DIFFERENTIAL      Component Value Range   WBC 5.5  3.9 - 10.3 10e3/uL  NEUT# 3.5  1.5 - 6.5 10e3/uL   HGB 12.3  11.6 - 15.9 g/dL   HCT 16.1  09.6 - 04.5 %   Platelets 198  145 - 400 10e3/uL   MCV 88.2  79.5 - 101.0 fL   MCH 29.6  25.1 - 34.0 pg   MCHC 33.6  31.5 - 36.0 g/dL   RBC 4.09  8.11 - 9.14 10e6/uL   RDW  14.2  11.2 - 14.5 %   lymph# 1.1  0.9 - 3.3 10e3/uL   MONO# 0.5  0.1 - 0.9 10e3/uL   Eosinophils Absolute 0.3  0.0 - 0.5 10e3/uL   Basophils Absolute 0.1  0.0 - 0.1 10e3/uL   NEUT% 64.2  38.4 - 76.8 %   LYMPH% 20.7  14.0 - 49.7 %   MONO% 8.4  0.0 - 14.0 %   EOS% 5.2  0.0 - 7.0 %   BASO% 1.5  0.0 - 2.0 %  PHOSPHORUS      Component Value Range   Phosphorus 1.8 (*) 2.3 - 4.6 mg/dL  RESEARCH LABS      Component Value Range   Research Labs Collected.    CK      Component Value Range   Total CK 56  7 - 177 U/L  BILIRUBIN, DIRECT      Component Value Range   Bilirubin, Direct 0.2  0.0 - 0.3 mg/dL  LIPID PANEL      Component Value Range   Cholesterol 318 (*) 0 - 200 mg/dL   Triglycerides 782 (*) <150 mg/dL   HDL 53  >95 mg/dL   Total CHOL/HDL Ratio 6.0     VLDL 61 (*) 0 - 40 mg/dL   LDL Cholesterol 621 (*) 0 - 99 mg/dL  CANCER ANTIGEN 30.86      Component Value Range   CA 27.29 224 (*) 0 - 39 U/mL  COMPREHENSIVE METABOLIC PANEL (CC13)      Component Value Range   Sodium 142  136 - 145 mEq/L   Potassium 3.7  3.5 - 5.1 mEq/L   Chloride 109 (*) 98 - 107 mEq/L   CO2 24  22 - 29 mEq/L   Glucose 133 (*) 70 - 99 mg/dl   BUN 57.8  7.0 - 46.9 mg/dL   Creatinine 0.8  0.6 - 1.1 mg/dL   Total Bilirubin 6.29  0.20 - 1.20 mg/dL   Alkaline Phosphatase 72  40 - 150 U/L   AST 34  5 - 34 U/L   ALT 20  0 - 55 U/L   Total Protein 6.6  6.4 - 8.3 g/dL   Albumin 3.3 (*) 3.5 - 5.0 g/dL   Calcium 9.5  8.4 - 52.8 mg/dL  URIC ACID (UX32)      Component Value Range   Uric Acid, Serum 3.1  2.6 - 7.4 mg/dl    CA 4401 from [birth year] was 331  Studies/Results: MRI abdomen 03-02-12 Comparison: MRI 01/02/2012 and 11/07/2011.  Findings:  Anterior hepatic dome lesion: 9 x 6 mm.  Medial segment left hepatic lobe lesion: 26 x 19 mm  Posterior segment right hepatic lobe: 11.5 x 9.5 mm.  Inferior right hepatic lobe lesion: 16 x 15 mm.  No new lesions. Stable diffuse low signal intense in the liver and   spleen suggesting iron deposition. Stable splenic cyst. The  pancreas is normal. No adrenal gland or kidney abnormalities are  identified. The stomach, duodenum, small bowel and colon grossly  normal. No mesenteric or retroperitoneal  mass or adenopathy.  Resolution of previously measured adenopathy.  Stable diffuse osseous metastatic disease.  IMPRESSION:  1. Further regression of hepatic metastatic disease.  2. No measurable abdominal lymphadenopathy.  3. Stable splenic cyst.  4. Stable osseous metastatic disease.   MRI PELVIS WITHOUT AND WITH CONTRAST 02-29-12 Technique: Multiplanar multisequence MR imaging of the pelvis was  performed both before and after administration of intravenous  contrast.  Contrast: 12mL MULTIHANCE GADOBENATE DIMEGLUMINE 529 MG/ML IV SOLN  Comparison: 01/02/2012.  Findings: Stable diffuse hepatic metastatic disease. No new  lesions or pathologic fracture. Stable left hip hardware.  There is a small amount of free pelvic fluid. No pelvic mass or  adenopathy.  IMPRESSION:  1. Diffuse osseous metastatic disease, not significantly changed.  2. No findings for acute pathologic fracture or new disease.  3. No significant intrapelvic abnormalities.   CT Chest 02-27-12 Comparison: Chest CT 02/01/2012.  Findings: All of the index lesions are in the liver and this is a  chest CT scan. The entire liver is not included on the study.  The chest wall is unremarkable and stable. No breast masses,  supraclavicular or axillary adenopathy. There are stable surgical  changes involving the left breast. The bony thorax is stable.  There is diffuse lytic and sclerotic bone disease.  The heart is normal in size. No pericardial effusion. No  mediastinal or hilar lymphadenopathy. The aorta is normal in  caliber. No dissection. The esophagus is grossly normal.  Examination of the lung parenchyma demonstrates stable surgical and  probable radiation changes involving the left  lung apex. There are  areas of stable subpleural scarring change involving the anterior  aspect of the left upper lobe in the posterior lateral aspect of  the right upper lobe likely due to radiation field. No worrisome  pulmonary nodules to suggest pulmonary metastatic disease. There  is bibasilar subpleural atelectasis.  Examination of the upper abdomen demonstrates multiple metastatic  hepatic lesions.  Anterior hepatic dome lesion 6.5 mm  Medial segment left hepatic lobe lesion: 27 mm  Posterior segment right hepatic lobe lesion: 10 mm  The other lesions are not seen on the study.  Stable 14 mm splenic lesion.  IMPRESSION:  1. Stable left apical scarring changes.  2. Probable radiation changes involving the left upper chest  anteriorly in the right midchest posteriorly.  3. Bibasilar subpleural atelectasis.  4. No metastatic pulmonary lesions.  5. Stable diffuse osseous metastatic disease.  6. Interval decrease in size of the hepatic metastatic lesions  NUCLEAR MEDICINE WHOLE BODY BONE SCINTIGRAPHY 03-01-12 Technique: Whole body anterior and posterior images were obtained  approximately 3 hours after intravenous injection of  radiopharmaceutical.  Radiopharmaceutical: CURIE TC-MDP TECHNETIUM TC 48M  MEDRONATE IV KIT  Comparison: Bone scan dated 01/05/2012 and a CT scan dated  10/10/2011  Findings: There are numerous metastatic lesions in the ribs, spine,  and skull. Subtle areas of increased activity in the hips and  pelvic bones are noted.  There has been a general decrease in the intensity of the activity  in all of the metastatic lesions. There are no new lesions.  IMPRESSION:  1. No new metastatic lesions.  2. Appreciable decrease in the activity of all of the demonstrated  metastases.    Medications: I have reviewed the patient's current medications. She has refused flu vaccine on last visits. With significant shortness of breath, she has been instructed to  hold both everolimus and letrozole as we evaluate.  Patient was  sent from [facility] to have LE venous dopplers (negative) and then for VQ scan (low probability PE).  We will arrange home O2 if possible, echocardiogram and pulmonary evaluation. Particular concern is pneumonitis related to everolimus; but could be other problem including infectious.   Assessment/Plan: 1.Metastatic hormone positive breast cancer: Scans document continued improvement with present regimen in areas of disease, however respiratory issues are significant. Plan as above. I will see her back 03-09-12 or sooner if needed. 2.rash from CT IV contrast despite benadryl prior. Will get VQ scan in preference to CT angio chest as this is urgent today.  3.left ureteral stent in  4.osteoporosis: predated present aromatase inhibitor, now also on IV bisphosphonate  5.history of anxiety/depression/ insomnia  6. DNR patient's request but does want treatment short of that.    Francene Mcerlean P, MD   03/07/2012, 1:59 PM

## 2012-03-08 ENCOUNTER — Telehealth: Payer: Self-pay

## 2012-03-08 ENCOUNTER — Ambulatory Visit: Payer: Medicare Other

## 2012-03-08 NOTE — Telephone Encounter (Signed)
Left message for patient that the echocardiogram showed good heart functioning per Dr. Darrold Span.  Requested that Kelli [name] call back to let us know how she is feeling.

## 2012-03-09 ENCOUNTER — Ambulatory Visit (HOSPITAL_BASED_OUTPATIENT_CLINIC_OR_DEPARTMENT_OTHER): Payer: Medicare Other

## 2012-03-09 ENCOUNTER — Other Ambulatory Visit: Payer: Self-pay

## 2012-03-09 ENCOUNTER — Ambulatory Visit (HOSPITAL_BASED_OUTPATIENT_CLINIC_OR_DEPARTMENT_OTHER): Payer: Medicare Other | Admitting: Oncology

## 2012-03-09 ENCOUNTER — Encounter: Payer: Self-pay | Admitting: Oncology

## 2012-03-09 ENCOUNTER — Telehealth: Payer: Self-pay | Admitting: Oncology

## 2012-03-09 VITALS — BP 140/79 | HR 102 | Temp 98.2°F | Resp 20 | Ht 64.0 in | Wt 137.6 lb

## 2012-03-09 DIAGNOSIS — M810 Age-related osteoporosis without current pathological fracture: Secondary | ICD-10-CM

## 2012-03-09 DIAGNOSIS — C7952 Secondary malignant neoplasm of bone marrow: Secondary | ICD-10-CM

## 2012-03-09 DIAGNOSIS — C50419 Malignant neoplasm of upper-outer quadrant of unspecified female breast: Secondary | ICD-10-CM

## 2012-03-09 DIAGNOSIS — C7951 Secondary malignant neoplasm of bone: Secondary | ICD-10-CM

## 2012-03-09 DIAGNOSIS — C50919 Malignant neoplasm of unspecified site of unspecified female breast: Secondary | ICD-10-CM | POA: Insufficient documentation

## 2012-03-09 DIAGNOSIS — R0602 Shortness of breath: Secondary | ICD-10-CM

## 2012-03-09 MED ORDER — HEPARIN SOD (PORK) LOCK FLUSH 100 UNIT/ML IV SOLN
500.0000 [IU] | Freq: Once | INTRAVENOUS | Status: DC | PRN
  Filled 2012-03-09: qty 5

## 2012-03-09 MED ORDER — SODIUM CHLORIDE 0.9 % IV SOLN
Freq: Once | INTRAVENOUS | Status: AC
  Administered 2012-03-09: 13:00:00 via INTRAVENOUS

## 2012-03-09 MED ORDER — ZOLEDRONIC ACID 4 MG/5ML IV CONC
4.0000 mg | Freq: Once | INTRAVENOUS | Status: AC
  Administered 2012-03-09: 4 mg via INTRAVENOUS
  Filled 2012-03-09: qty 5

## 2012-03-09 MED ORDER — HYDROCODONE-ACETAMINOPHEN 5-325 MG PO TABS: ORAL_TABLET | ORAL | Status: DC

## 2012-03-09 MED ORDER — DICLOFENAC SODIUM 1 % TD GEL: TRANSDERMAL | Status: DC

## 2012-03-09 NOTE — Progress Notes (Signed)
OFFICE PROGRESS NOTE   03/09/2012   Physicians:J.Hewitt, J.Kinard, J.Beekman, W.Elkins, M.Altheimer, S.Dahlstedt, C.Ezzard Standing, new consultation with Dr [name] 03-19-12.   INTERVAL HISTORY:  Patient is seen, together with husband, in follow up of shortness of breath which was very symptomatic when I saw her on 03-03-12 and has improved since then with everolimus and letrozole held. She has been evaluated with LE dopplers, VQ, and echocardiogram all unremarkable, had CT chest just prior to visit last week without obvious change, and is set up to see Dr Seth Bake.Sood on 03-19-12. She is on Bolero 4 trial for her metastatic breast cancer with everolimus and letrozole, and is also due monthly zometa today. She has been able to tell improvement in dyspnea since at least Dec 14-15.  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 She was treated with lumpectomy and 18 node axillary dissection, adjuvant adriamycin/cytoxan followed by [facility] chemotherapy, local radiation, tamoxifen from April 2003 thru Dec 2003 then arimidex from Dec 2004 thru June 2011. The arimidex had been continued for that duration due to high risk features, stopped as there had been no evidence of disease and with known osteoporosis. She was clinically stable and labs were normal other than very slight decrease in hemoglobin when I saw her in Jan 2013. She developed acute pain in left hip in mid June which did not respond to pain medication by rheumatologist, then MRI hip 09-20-11 showed metastatic disease with pathologic fracture of femoral neck. She was admitted 7-3 thru 09-26-11, with nailing by Dr Victorino Dike on 09-24-11. Pathology from the surgery confirmed metastatic breast carcinoma, ER+ 100%, PR negative and HER 2 negative by [facility]. She has left ureteral stent by Dr Retta Diones 11-01-11 for obstructive hydronephrosis. She received radiation  3000 cGy in 10 fractions to left femur 7-24 thru 10-28-11 by Dr Roselind Messier. Bolero 4 study required waiting 2 weeks beyond completion of RT to begin, and was started 11-12-11. Everolimus dose was reduced due to epistaxis; stomatitis has not been a problem on present dose. Restaging scans done per protocol earlier this month (bone scan, CT chest, MRI abdomen and pelvis) all show further improvement in metastatic disease, without new areas or other progression.  Patient was able to walk into office today using cane; she was able to climb stairs in home and went about regular activities at home yesterday. Her insurance refused to cover short term home O2 even when so symptomatic last week, so she does not have that available. She denies cough or chest pain. She has some discomfort low back unchanged, and some with LLE where she had pinning of pathologic hip fx. Samples of voltaren gel from another physician have helped left knee and she can continue this prn. Appetite is good. She has had no fever or symptoms of infection. She has had no epistaxis or other bleeding at least in past few days. She notices slight swelling left ankle by end of day. Remainder of 10 point Review of Systems negative.  ECOG PS back to 1-2 Objective:  Vital signs in last 24 hours:  BP 140/79  Pulse 102  Temp 98.2 F (36.8 C) (Oral)  Resp 20  Ht 5\' 4"  (1.626 m)  Wt 137 lb 9.6 oz (62.415 kg)  BMI 23.62 kg/m2  Weight is up 1 lb. Looks much more comfortable, not dyspneic in exam room O2 saturation 95% at rest and 91 %  walking.   HEENT:PERRLA, sclera clear, anicteric, oropharynx clear, no lesions and neck supple with midline trachea No alopecia LymphaticsCervical, supraclavicular, and axillary nodes normal. Resp: clear to auscultation bilaterally and normal percussion bilaterally Cardio: regular rate and rhythm GI: soft, not tender, normal bowel sounds Extremities: extremities normal, atraumatic, no cyanosis or edema Neuro:no  sensory deficits noted Back not tender to palpation Skin without rash or ecchymosis Lab Results:  Results for orders placed in visit on 03/03/12  CBC WITH DIFFERENTIAL      Component Value Range   WBC 5.5  3.9 - 10.3 10e3/uL   NEUT# 3.5  1.5 - 6.5 10e3/uL   HGB 12.3  11.6 - 15.9 g/dL   HCT 16.1  09.6 - 04.5 %   Platelets 198  145 - 400 10e3/uL   MCV 88.2  79.5 - 101.0 fL   MCH 29.6  25.1 - 34.0 pg   MCHC 33.6  31.5 - 36.0 g/dL   RBC 4.09  8.11 - 9.14 10e6/uL   RDW 14.2  11.2 - 14.5 %   lymph# 1.1  0.9 - 3.3 10e3/uL   MONO# 0.5  0.1 - 0.9 10e3/uL   Eosinophils Absolute 0.3  0.0 - 0.5 10e3/uL   Basophils Absolute 0.1  0.0 - 0.1 10e3/uL   NEUT% 64.2  38.4 - 76.8 %   LYMPH% 20.7  14.0 - 49.7 %   MONO% 8.4  0.0 - 14.0 %   EOS% 5.2  0.0 - 7.0 %   BASO% 1.5  0.0 - 2.0 %  PHOSPHORUS      Component Value Range   Phosphorus 1.8 (*) 2.3 - 4.6 mg/dL  RESEARCH LABS      Component Value Range   Research Labs Collected.    CK      Component Value Range   Total CK 56  7 - 177 U/L  BILIRUBIN, DIRECT      Component Value Range   Bilirubin, Direct 0.2  0.0 - 0.3 mg/dL  LIPID PANEL      Component Value Range   Cholesterol 318 (*) 0 - 200 mg/dL   Triglycerides 782 (*) <150 mg/dL   HDL 53  >95 mg/dL   Total CHOL/HDL Ratio 6.0     VLDL 61 (*) 0 - 40 mg/dL   LDL Cholesterol 621 (*) 0 - 99 mg/dL  CANCER ANTIGEN 30.86      Component Value Range   CA 27.29 224 (*) 0 - 39 U/mL  COMPREHENSIVE METABOLIC PANEL (CC13)      Component Value Range   Sodium 142  136 - 145 mEq/L   Potassium 3.7  3.5 - 5.1 mEq/L   Chloride 109 (*) 98 - 107 mEq/L   CO2 24  22 - 29 mEq/L   Glucose 133 (*) 70 - 99 mg/dl   BUN 57.8  7.0 - 46.9 mg/dL   Creatinine 0.8  0.6 - 1.1 mg/dL   Total Bilirubin 6.29  0.20 - 1.20 mg/dL   Alkaline Phosphatase 72  40 - 150 U/L   AST 34  5 - 34 U/L   ALT 20  0 - 55 U/L   Total Protein 6.6  6.4 - 8.3 g/dL   Albumin 3.3 (*) 3.5 - 5.0 g/dL   Calcium 9.5  8.4 - 52.8 mg/dL  URIC  ACID (UX32)      Component Value Range   Uric Acid, Serum 3.1  2.6 - 7.4 mg/dl     Studies/Results: NUCLEAR MEDICINE VENTILATION -  PERFUSION LUNG SCAN 03-03-12 Technique: Ventilation images were obtained in multiple  projections using inhaled aerosol technetium 99 M DTPA. Perfusion  images were obtained in multiple projections after intravenous  injection of Tc-67m MAA.  Radiopharmaceuticals: Tc-32m DTPA aerosol and 5.2 mCi Tc-27m  MAA.  Comparison: Chest radiograph 03/03/2012  Findings:  Ventilation: No focal ventilation defect.  Perfusion: No wedge shaped peripheral perfusion defects to  suggest acute pulmonary embolism  IMPRESSION:  Very low probability for acute pulmonary embolism.   Echocardiogram 03-05-12 LV EF: 55% - 60% Study Conclusions  - Left ventricle: The cavity size was normal. Wall thickness was increased in a pattern of mild LVH. Systolic function was normal. The estimated ejection fraction was in the range of 55% to 60%. Doppler parameters are consistent with abnormal left ventricular relaxation (grade 1 diastolic dysfunction). - Mitral valve: Mild regurgitation. - Pericardium, extracardiac: A trivial pericardial effusion was identified.    LE venous dopplers 03-03-12 - No evidence of deep vein thrombosis involving the right lower extremity. - No evidence of deep vein thrombosis involving the left lower extremity. - No evidence of Baker's cyst on the right or left.   Medications: I have reviewed the patient's current medications. She will resume letrozole as no DVT or PE; she will continue to hold the everolimus at least until she is seen by pulmonary on 03-19-12. I do not see any indication for steroids now as respiratory symptoms have improved clinically. On study we may be able to resume everolimus at lower dose even with the previous dose reduction due to epistaxis. As we are holding everolimus, we have not begun any medication for increased  lipids. She will receive zometa on schedule today.  Assessment/Plan: 1.Metastatic breast carcinoma: on Bolero 4 study, everolimus held since 03-03-12 due to dyspnea concerning for pneumonitis from that drug. Dyspnea already improved, tho not resolved, with pulmonary consultation upcoming. Will resume letrozole and continue zometa. She has responded so well with Rx to date that I would be in favor of resuming everolimus at reduced dose if pulmonologist agrees. 2.left ureteral stent in 3.osteoporosis: on IV bisphosphonate for metastatic disease now 4.DNR but support to that point 5.history of anxiety, depression, insomnia 6.previous epistaxis from everolimus: much improved with dose adjustment  LIVESAY,LENNIS P, MD   03/09/2012, 10:52 AM

## 2012-03-09 NOTE — Telephone Encounter (Signed)
gv pt husband appt schedule for January 2014.

## 2012-03-19 ENCOUNTER — Ambulatory Visit (INDEPENDENT_AMBULATORY_CARE_PROVIDER_SITE_OTHER): Payer: Medicare Other | Admitting: Pulmonary Disease

## 2012-03-19 ENCOUNTER — Encounter: Payer: Self-pay | Admitting: Pulmonary Disease

## 2012-03-19 VITALS — BP 122/62 | HR 86 | Temp 98.6°F | Ht 64.0 in | Wt 136.0 lb

## 2012-03-19 DIAGNOSIS — R0902 Hypoxemia: Secondary | ICD-10-CM

## 2012-03-19 DIAGNOSIS — R0609 Other forms of dyspnea: Secondary | ICD-10-CM

## 2012-03-19 DIAGNOSIS — R0600 Dyspnea, unspecified: Secondary | ICD-10-CM | POA: Insufficient documentation

## 2012-03-19 NOTE — Patient Instructions (Signed)
Will arrange for Echocardiogram and Breathing test (PFT) Will arrange for home oxygen set up Will arrange for overnight oxygen test Follow up in 3 weeks

## 2012-03-19 NOTE — Progress Notes (Deleted)
  Subjective:    Patient ID: Kelli [name], female    DOB: Jul 23, 1940, 71 y.o.   MRN: [id number]  HPI    Review of Systems  Constitutional: Negative for fever and unexpected weight change.  HENT: Positive for nosebleeds, congestion and sneezing. Negative for ear pain, sore throat, rhinorrhea, trouble swallowing, dental problem, postnasal drip and sinus pressure.   Eyes: Negative for redness and itching.  Respiratory: Positive for shortness of breath. Negative for cough, chest tightness and wheezing.   Cardiovascular: Positive for palpitations and leg swelling.  Gastrointestinal: Negative for nausea and vomiting.  Genitourinary: Negative for dysuria.  Musculoskeletal: Positive for joint swelling.  Skin: Positive for rash.  Neurological: Negative for headaches.  Hematological: Bruises/bleeds easily.  Psychiatric/Behavioral: Positive for dysphoric mood. The patient is nervous/anxious.        Objective:   Physical Exam        Assessment & Plan:

## 2012-03-19 NOTE — Progress Notes (Signed)
Chief Complaint  Patient presents with  . Pulmonary Consult    referred by Dr. Darrold Span    History of Present Illness: Kelli [name] is a 71 y.o. female never smoker for evaluation of dyspnea.  She has history of metastatic breast cancer.  She was initially treated with lumpectomy and nod dissection followed by adriamycin/cytoxan, local radiation and tamoxifen in 2003.  She was then switched to arimidex until 2011.  She developed pathologic hip fracture in 2013 from metastatic breast cancer.  She was enrolled in breast cancer research protocol (Bolero 4 trial) 11/12/11 with everolimus and letrozole.  After starting these she developed problems with her breathing.  These have since been stopped, but her dyspnea has persisted.   She gets winded with minimal activity.  She also gets soreness in her mid-chest at times.  She denies cough, wheeze, or sputum.  She has not had fever, sweats, skin rashes,or leg swelling.  There is no history of thrombo-embolic disease.  She denies prior history of pneumonia or exposure to tuberculosis.  Tests: CT chest 02/27/12 >> post XRT changes Lt apex V/Q scan 03/03/12 >> Very low probability for PE Doppler legs 03/03/12 >> no DVT Rt or Lt legs Echo 03/05/12 >> mild LVH, EF 55 to 60%, grade 1 diastolic dysfx, mild MR 03/19/12 SpO2 room air with exertion 74% >> 2 liters oxygen started   Past Medical History  Diagnosis Date  . Thyroid disease IN 1982    HX IODINE-131 ABLATION FOR HYPERTHYROIDISM  . Osteoporosis   . Vitamin D insufficiency   . Chronic fatigue   . Depression   . Anxiety   . Insomnia   . History of chemotherapy     Adriamycin/cytoxan followed by [facility]  08/2000/tamoxifen 06/2001-02/2003,then arimidex 02/2003-08/2009  . 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 History of radiation therapy 12/28/00-02/12/01    left breast  With Dr. Jamie Kato  . Hypothyroidism   . Fatigue   . Short of breath on exertion   . Breast cancer, left JUNE OF 2002      LEFT BREAST, ER/PR +, HER 2 -  . 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 Metastatic cancer to liver 09/24/11    CURRENTLY RECEIVING RADIATION  . Radiation 10/15/11-10/28/2011    left proximal leg    Past Surgical History  Procedure Date  . Cholecystectomy 1970  . Femur im nail 09/24/2011 FEMURAL NECK FX/ INTERTROCHANTERIC PATHOLOGIC FX    Procedure: INTRAMEDULLARY (IM) NAIL FEMORAL;  Surgeon: Toni Arthurs, MD;  Location: [facility];  Service: Orthopedics;  Laterality: Left;  . Bone biopsy 09/25/2011    left medullary canal femur reaming=Metastatic Carcinoma  . Hysteroscopy w/d&c 01-11-2003    REMOVAL POLYP  . Left partial mastectomy / sentinel lymph node bx 08-28-2000    LEFT BREAST CANCER    Current Outpatient Prescriptions on File Prior to Visit  Medication Sig Dispense Refill  . diclofenac sodium (VOLTAREN) 1 % GEL Appy to left knee daily to twice a day as needed.  100 g  1  . diphenhydrAMINE (BENADRYL) 25 MG tablet Take 25 mg by mouth every 6 (six) hours as needed. 1-2 tablets as needed for itching      . FLUoxetine (PROZAC) 20 MG capsule Take 1 capsule (20 mg total) by mouth daily.  90 capsule  PRN  . HYDROcodone-acetaminophen (NORCO) 5-325 MG per tablet TAKE ONE TAB EVERY FOUR TO SIX HOURS PRN PAIN  40 tablet  0  . letrozole (FEMARA) 2.5 MG tablet Take 1 tablet (2.5 mg total) by mouth daily.  30 tablet  5  . levothyroxine (SYNTHROID, LEVOTHROID) 75 MCG tablet Take 75 mcg by mouth every morning.       . loperamide (IMODIUM A-D) 2 MG tablet Take 2 mg by mouth 4 (four) times daily as needed.      Marland Kitchen LORazepam (ATIVAN) 0.5 MG tablet Take 0.5 mg by mouth every 8 (eight) hours as needed. For anxiety      . sodium chloride (OCEAN) 0.65 % nasal spray Place 1 spray into the nose as needed (to keep nasal passages moist). Use every 1-2 hours while awake to keep nasal passages moist.  30 mL  12  . zolpidem (AMBIEN) 5 MG tablet Take 1 tablet (5 mg total) by mouth at  bedtime as needed.  30 tablet  1  . Investigational everolimus (RAD001) 5 MG tablet [employer] [id number] Take 1 tablet by mouth daily. Take with a glass of water.  70 tablet  0    Allergies  Allergen Reactions  . Other     "MYCIN" DRUGS  . Sulfa Antibiotics     No family history on file.  History  Substance Use Topics  . Smoking status: Never Smoker   . Smokeless tobacco: Never Used  . Alcohol Use: No    Review of Systems  Constitutional: Negative for fever and unexpected weight change.  HENT: Positive for nosebleeds, congestion and sneezing. Negative for ear pain, sore throat, rhinorrhea, trouble swallowing, dental problem, postnasal drip and sinus pressure.   Eyes: Negative for redness and itching.  Respiratory: Positive for shortness of breath. Negative for cough, chest tightness and wheezing.   Cardiovascular: Positive for palpitations and leg swelling.  Gastrointestinal: Negative for nausea and vomiting.  Genitourinary: Negative for dysuria.  Musculoskeletal: Positive for joint swelling.  Skin: Positive for rash.  Neurological: Negative for headaches.  Hematological: Bruises/bleeds easily.  Psychiatric/Behavioral: Positive for dysphoric mood. The patient is nervous/anxious.    Physical Exam: Filed Vitals:   03/19/12 1533  BP: 122/62  Pulse: 86  Temp: 98.6 F (37 C)  TempSrc: Oral  Height: 5\' 4"  (1.626 m)  Weight: 136 lb (61.689 kg)  SpO2: 96%    Current Encounter SPO2  03/19/12 1533 96%  10/30/11 1253 95%  10/30/11 1215 94%  10/30/11 1200 100%  10/30/11 1145 100%  10/30/11 0940 97%  10/30/11 1253 95%  10/30/11 1215 94%  10/30/11 1200 100%  10/30/11 1145 100%  10/30/11 0940 97%    Wt Readings from Last 3 Encounters:  03/19/12 136 lb (61.689 kg)  03/09/12 137 lb 9.6 oz (62.415 kg)  02/04/12 136 lb 6.4 oz (61.871 kg)    Body mass index is 23.34 kg/(m^2).   General - No distress ENT - No sinus tenderness, no oral exudate, no LAN, no  thyromegaly, TM clear, pupils equal/reactive Cardiac - s1s2 regular with extra beats, no murmur, pulses symmetric Chest - No wheeze/rales/dullness, good air entry, normal respiratory excursion Back - No focal tenderness Abd - Soft, non-tender, no organomegaly, + bowel sounds Ext - No edema Neuro - Normal strength, cranial nerves intact Skin - No rashes Psych - Normal mood, and behavior    Lab Results  Component Value Date   WBC 5.5 03/03/2012   HGB 12.3 03/03/2012   HCT 36.6 03/03/2012   MCV 88.2 03/03/2012   PLT 198 03/03/2012    Lab Results  Component Value Date   CREATININE  0.8 03/03/2012   BUN 12.0 03/03/2012   NA 142 03/03/2012   K 3.7 03/03/2012   CL 109* 03/03/2012   CO2 24 03/03/2012    Lab Results  Component Value Date   ALT 20 03/03/2012   AST 34 03/03/2012   ALKPHOS 72 03/03/2012   BILITOT 0.70 03/03/2012    Lab Results  Component Value Date   TSH 0.545 08/19/2005    ECG 03/19/12 >> sinus rhythm with PAC's  Assessment/Plan:  [name], MD [facility] Pager:  [phone] 03/19/2012, 4:07 PM

## 2012-03-25 ENCOUNTER — Ambulatory Visit (HOSPITAL_COMMUNITY): Payer: Medicare Other | Attending: Cardiology | Admitting: Radiology

## 2012-03-25 ENCOUNTER — Other Ambulatory Visit (HOSPITAL_COMMUNITY): Payer: Medicare Other

## 2012-03-25 ENCOUNTER — Other Ambulatory Visit (HOSPITAL_COMMUNITY): Payer: Self-pay | Admitting: *Deleted

## 2012-03-25 ENCOUNTER — Telehealth: Payer: Self-pay | Admitting: Pulmonary Disease

## 2012-03-25 ENCOUNTER — Encounter (HOSPITAL_COMMUNITY): Payer: Self-pay | Admitting: *Deleted

## 2012-03-25 DIAGNOSIS — R0602 Shortness of breath: Secondary | ICD-10-CM

## 2012-03-25 DIAGNOSIS — R0600 Dyspnea, unspecified: Secondary | ICD-10-CM

## 2012-03-25 DIAGNOSIS — R0609 Other forms of dyspnea: Secondary | ICD-10-CM | POA: Insufficient documentation

## 2012-03-25 DIAGNOSIS — C801 Malignant (primary) neoplasm, unspecified: Secondary | ICD-10-CM | POA: Insufficient documentation

## 2012-03-25 DIAGNOSIS — R0902 Hypoxemia: Secondary | ICD-10-CM

## 2012-03-25 DIAGNOSIS — C50919 Malignant neoplasm of unspecified site of unspecified female breast: Secondary | ICD-10-CM | POA: Insufficient documentation

## 2012-03-25 DIAGNOSIS — R0989 Other specified symptoms and signs involving the circulatory and respiratory systems: Secondary | ICD-10-CM | POA: Insufficient documentation

## 2012-03-25 NOTE — Progress Notes (Signed)
Patient ID: Kelli [name], female   DOB: 11-09-1940, 72 y.o.   MRN: [id number] 22G IV started in R hand- site unremarkable - for a bubble study. Bonnita Levan, RN

## 2012-03-25 NOTE — Telephone Encounter (Signed)
Called, spoke with Marcelle Smiling in the echo lab.  States pt had a complete echo on on 03/05/12 at [facility].  Calling to let Dr. Craige Cotta know that because of this, she did a limited with bubble.  Per Marcelle Smiling, this is FYI only.  Will route to VS.

## 2012-03-25 NOTE — Progress Notes (Signed)
Limited Echo with Agitated Saline.

## 2012-03-26 NOTE — Telephone Encounter (Signed)
Will sign and forward as FYI per protocol.

## 2012-03-28 ENCOUNTER — Other Ambulatory Visit: Payer: Self-pay | Admitting: Oncology

## 2012-03-30 ENCOUNTER — Telehealth: Payer: Self-pay | Admitting: Pulmonary Disease

## 2012-03-30 DIAGNOSIS — R0902 Hypoxemia: Secondary | ICD-10-CM | POA: Insufficient documentation

## 2012-03-30 NOTE — Assessment & Plan Note (Signed)
She has dyspnea with exertion.  She was noted to have significant oxygen desaturation with exertion in office today.  While this may explain her dyspnea, I do not have a good explanation for why she is hypoxic with exertion.  She had recent CT chest and V/Q scan which were relatively unremarkable.  She had recent Echo which also was unrevealing.  Will arrange for home oxygen set up.  Will arrange for Echo with bubble study to assess for shunt.  Will arrange for pulmonary function testing also.

## 2012-03-30 NOTE — Telephone Encounter (Signed)
Echo with bubble study 03/25/12 >> Bubble study using agitated saline shows no evidence of any shunt.   Will have my nurse inform patient that Echo with bubble study was normal.  No evidence for shunting of blood past lungs.  No change to current treatment plan.

## 2012-03-30 NOTE — Assessment & Plan Note (Addendum)
Will arrange for home oxygen set up with 2 liters with exertion.  Will arrange for ONO on room air, and then determine if she needs to use oxygen at night also.

## 2012-03-31 ENCOUNTER — Encounter: Payer: Self-pay | Admitting: Oncology

## 2012-03-31 ENCOUNTER — Encounter: Payer: Self-pay | Admitting: *Deleted

## 2012-03-31 ENCOUNTER — Other Ambulatory Visit: Payer: Self-pay | Admitting: *Deleted

## 2012-03-31 ENCOUNTER — Other Ambulatory Visit (HOSPITAL_BASED_OUTPATIENT_CLINIC_OR_DEPARTMENT_OTHER): Payer: Medicare Other | Admitting: Lab

## 2012-03-31 ENCOUNTER — Telehealth: Payer: Self-pay | Admitting: Pulmonary Disease

## 2012-03-31 ENCOUNTER — Ambulatory Visit (HOSPITAL_BASED_OUTPATIENT_CLINIC_OR_DEPARTMENT_OTHER): Payer: Medicare Other | Admitting: Oncology

## 2012-03-31 VITALS — BP 115/67 | HR 101 | Temp 98.2°F | Resp 20 | Ht 64.0 in | Wt 138.3 lb

## 2012-03-31 DIAGNOSIS — C787 Secondary malignant neoplasm of liver and intrahepatic bile duct: Secondary | ICD-10-CM

## 2012-03-31 DIAGNOSIS — R0902 Hypoxemia: Secondary | ICD-10-CM

## 2012-03-31 DIAGNOSIS — M810 Age-related osteoporosis without current pathological fracture: Secondary | ICD-10-CM

## 2012-03-31 DIAGNOSIS — C50419 Malignant neoplasm of upper-outer quadrant of unspecified female breast: Secondary | ICD-10-CM

## 2012-03-31 DIAGNOSIS — C7951 Secondary malignant neoplasm of bone: Secondary | ICD-10-CM

## 2012-03-31 DIAGNOSIS — C50919 Malignant neoplasm of unspecified site of unspecified female breast: Secondary | ICD-10-CM

## 2012-03-31 DIAGNOSIS — C7952 Secondary malignant neoplasm of bone marrow: Secondary | ICD-10-CM

## 2012-03-31 LAB — COMPREHENSIVE METABOLIC PANEL
Albumin: 3.4 g/dL — ABNORMAL LOW (ref 3.5–5.2)
Alkaline Phosphatase: 90 U/L (ref 39–117)
BUN: 12 mg/dL (ref 6–23)
CO2: 26 mEq/L (ref 19–32)
Glucose, Bld: 122 mg/dL — ABNORMAL HIGH (ref 70–99)
Potassium: 3.5 mEq/L (ref 3.5–5.3)
Total Protein: 6.3 g/dL (ref 6.0–8.3)

## 2012-03-31 LAB — LIPID PANEL
Cholesterol: 247 mg/dL — ABNORMAL HIGH (ref 0–200)
HDL: 56 mg/dL (ref >39)
LDL Cholesterol: 148 mg/dL — ABNORMAL HIGH (ref 0–99)
Total CHOL/HDL Ratio: 4.4 ratio
Triglycerides: 215 mg/dL — ABNORMAL HIGH (ref <150)
VLDL: 43 mg/dL — ABNORMAL HIGH (ref 0–40)

## 2012-03-31 LAB — URIC ACID: Uric Acid, Serum: 4 mg/dL (ref 2.4–7.0)

## 2012-03-31 LAB — CBC WITH DIFFERENTIAL/PLATELET
BASO%: 0.6 % (ref 0.0–2.0)
Basophils Absolute: 0.1 10e3/uL (ref 0.0–0.1)
EOS%: 1.7 % (ref 0.0–7.0)
Eosinophils Absolute: 0.2 10e3/uL (ref 0.0–0.5)
HCT: 37.2 % (ref 34.8–46.6)
HGB: 12.7 g/dL (ref 11.6–15.9)
LYMPH%: 13.5 % — ABNORMAL LOW (ref 14.0–49.7)
MCH: 31.5 pg (ref 25.1–34.0)
MCHC: 34 g/dL (ref 31.5–36.0)
MCV: 92.7 fL (ref 79.5–101.0)
MONO#: 0.5 10e3/uL (ref 0.1–0.9)
MONO%: 6.1 % (ref 0.0–14.0)
NEUT#: 6.9 10e3/uL — ABNORMAL HIGH (ref 1.5–6.5)
NEUT%: 78.1 % — ABNORMAL HIGH (ref 38.4–76.8)
Platelets: 200 10e3/uL (ref 145–400)
RBC: 4.02 10e6/uL (ref 3.70–5.45)
RDW: 17.9 % — ABNORMAL HIGH (ref 11.2–14.5)
WBC: 8.8 10e3/uL (ref 3.9–10.3)
lymph#: 1.2 10e3/uL (ref 0.9–3.3)

## 2012-03-31 LAB — BILIRUBIN, DIRECT: Bilirubin, Direct: 0.2 mg/dL (ref 0.0–0.3)

## 2012-03-31 LAB — CK: Total CK: 39 U/L (ref 7–177)

## 2012-03-31 MED ORDER — TEMAZEPAM 15 MG PO CAPS: ORAL_CAPSULE | ORAL | Status: DC

## 2012-03-31 NOTE — Progress Notes (Unsigned)
03/31/12 at 1:17pm - [employer], Bolero 4 End of Treatment visit- The pt was into the cancer center today for her cycle 6, day 1 assessments.  The pt confirmed that she was fasting before her blood draw this am.  The pt confirmed that she re-started her letrozole on 03/09/12.  The pt's vitals were taken after the pt was seated for 5 minutes.  The pt's concomitant medication log was reviewed with the pt, and she specifically denies any new medication changes.  She does report some mild constipation, and she is taking Sennakot S (8.6/50mg ) which she said she has taken in the past.  She said that she started on her intermittent oxygen at home on 03/19/12 after she was seen by Dr. Craige Cotta, pulmonologist.  The pt was seen and examined today by Dr. Darrold Span.  The pt's ECOG is 2.  The pt said that she read over her consent last night with her husband.  She said that she has experienced a lot of the side effects from everolimus noted in the consent.  She said that she really doesn't feel comfortable going back on the everolimus.  The physician said that she would not re-start the everolimus today because the pt is still having dyspnea on exertion.  The physician asked the nurse if the study would allow the pt to remain off of the everolimus longer so that the MD could review the results of the pulmonary function tests scheduled for 04/09/12.  The research nurse emailed Trudie Buckler, monitor, regarding the pt's request.  The pt is aware that today may be end the of her everolimus treatment.  The pt verbalized understanding.  The pt will see the pulmonologist next week for further evaluation of her hypoxia.    04/01/12 at 12:56pm - The research nurse received an email from [facility], clinical trial head, stating the pt could "remain on letrozole as part of the study".  Corinne said this would not be considered the pt's EOT.  The nurse notified Dr. Darrold Span regarding the permanent discontinuation of the everolimus and the pt's  continuation of letrozole monotherapy on the study.    04/05/12 at 10am- The research nurse and the pt's physician discussed the pt's participation in the study.  The physician states she wants the pt to remain on letrozole, but she wants the pt to come off of the study.  The physician states that she would not order scans every 8 weeks if the pt was just receiving letrozole.  The physician asked the research nurse to discuss all of the options with the pt when she comes in on 04/06/12 for her Zometa infusion.    04/06/12 at 1:10pm- The research nurse spoke to the pt and her husband this afternoon after her Zometa infusion.  The pt was told that Dr. Darrold Span wants her to remain on letrozole but to receive her letrozole off-study.  The pt was informed that Dr. Darrold Span would not perform scans every 8 weeks if she is off-study.  The pt was told that since she is off-study now that she will need end of treatment scans.  The pt was reluctant to have these final scans performed.  She said that she just had scans done in December.  The nurse explained that it has been greater than 4 weeks since her last scans and the study requires a disease response when the study drugs are discontinued.  The pt asked if the study would pay for the scans since her physician would  not order them this early.  The research nurse will discuss with Alleen Borne, [employer] monitor.  The pt said that she may very well decline any further study assessments.  The research nurse explained that the pt can always refuse or withdraw her consent.  The pt said that she wanted to go home and think about it overnight.  The nurse thanked the patient for participation and support of the study.  The pt apologized for coming off due to her dyspnea.  The nurse will contact the pt tomorrow to see if she has made her decision regarding the end of treatment scans.  The pt said that she definitely does not want scans off-study every 8 weeks.  The nurse also  verified that she started on her new sleeping medication on 03/31/12.  She said that she is continuing to take [place] also for insomnia (reported at baseline).  The pt states that she continues to be have mild fatigue.    04/07/12 at 3:04pm - The pt called the nurse and said that she had decided that she did not want anymore scans on study.  She said that she did not to have her EOT scans and she did not want imaging every 8 weeks.  The pt was informed that the study requests this information since she is coming off of study not for disease progression.  She said that she wanted to withdraw her consent from anymore study procedures.  She states the nurse can obtain data on her survival and any new treatments.  The pt was thanked for her support of this study.  The pt was told that she has the right to refuse any study procedure.  The research nurse spoke at length with Alleen Borne, [employer] study monitor (on-site today for monitoring visit), regarding this pt's withdrawal of consent.  Yolanda emailed the study team to see how to enter the EOT data in the [facility].    04/09/12 at 10:12am - The research emailed Alleen Borne, site monitor, to inquire about the pt's end of treatment assessments.  The monitor was informed that the pt did not want any further scans performed for study purposes.  The pt has an afternoon appointment with Dr. Craige Cotta for further evaluation of her dyspnea and hypoxia.    04/13/12 at 1:40pm - The research nurse and Dr. Darrold Span reviewed Dr. Evlyn Courier notes from 04/09/12.  Dr. Darrold Span was asked if she felt the pt's "dyspnea / hypoxia" was a serious adverse event in her opinion.  The MD was given section 8.2 of the protocol regarding the definitions of a serious adverse event.  Dr. Darrold Span read the section carefully and stated that the pt's dyspnea did not fulfill any of the SAE definitions in her opinion.  Therefore, the pt's dyspnea will continue to be recorded in the AE section of the [facility].  The  research nurse will continue to monitor the pt's adverse events and report them accordingly.

## 2012-03-31 NOTE — Progress Notes (Signed)
OFFICE PROGRESS NOTE   03/31/2012   Physicians:J.Hewitt, J.Kinard, J.Beekman, W.Elkins, M.Altheimer, S.Dahlstedt, C.Ezzard Standing, V. Sood    INTERVAL HISTORY:   Patient is seen, together with husband, in continuing attention to her metastatic breast carcinoma, on Bolero 4 study with everolimus and letrozole since 11-12-11, and additionally receiving zometa. Everolimus was initially dose reduced due to epistaxis and most recently has been held since 03-03-13 due to dyspnea; letrozole was held briefly at that time but resumed on 03-09-13. Respiratory symptoms have improved, tho she still notices shortness of breath with more strenuous activities. She has seen Dr Craige Cotta in consultation, with echo bubble study normal and PFTs pending next week.  As she has been off everolimus x 4 weeks now, she will have to come off study unless we get an extension to allow the PFTs; patient states that she is very reluctant to consider resuming everolimus even at further reduced dose. 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 She was treated with lumpectomy and 18 node axillary dissection, adjuvant adriamycin/cytoxan followed by [facility] chemotherapy, local radiation, tamoxifen from April 2003 thru Dec 2003 then arimidex from Dec 2004 thru June 2011. The arimidex had been continued for that duration due to high risk features, stopped as there had been no evidence of disease and with known osteoporosis. She was clinically stable and labs were normal other than very slight decrease in hemoglobin when I saw her in Jan 2013. She developed acute pain in left hip in mid June which did not respond to pain medication by rheumatologist, then MRI hip 09-20-11 showed metastatic disease with pathologic fracture of femoral neck. She was admitted 7-3 thru 09-26-11, with nailing by Dr Victorino Dike on 09-24-11. Pathology from the surgery confirmed  metastatic breast carcinoma, ER+ 100%, PR negative and HER 2 negative by [facility]. She has left ureteral stent by Dr Retta Diones 11-01-11 for obstructive hydronephrosis. She received radiation 3000 cGy in 10 fractions to left femur 7-24 thru 10-28-11 by Dr Roselind Messier. Bolero 4 study required waiting 2 weeks beyond completion of RT to begin, and was started 11-12-11. Everolimus dose was reduced due to epistaxis; stomatitis has not been a problem on present dose. Restaging scans done per protocol in Dec 2013  (bone scan, CT chest, MRI abdomen and pelvis) all show further improvement in metastatic disease, without new areas or other progression.  Patient is now back with all activities that she was tolerating prior to the respiratory problems. She had different pain left hip/LLQ last pm, possibly from carrying 20+ lb dog. She has had no increase in cough, no different chest pain, no GI symptoms, no bleeding including epistaxis, no LE swelling, no fever or symptoms of infection. She wants to try a different sleeping medication as her insurance will cover only total 90 tablets of ambien for 2014. Remainder of 10 point Review of Systems negative.  ECOG PS 2  Objective:  Vital signs in last 24 hours:  BP 115/67  Pulse 101  Temp 98.2 F (36.8 C) (Oral)  Resp 20  Ht 5\' 4"  (1.626 m)  Wt 138 lb 4.8 oz (62.732 kg)  BMI 23.74 kg/m2 Ambulatory without assistance, able to get on and off exam table, respirations not labored RA. Alert, looks comfortable.   HEENT:PERRLA, sclera clear, anicteric and oropharynx clear, no lesions No alopecia. No bleeding. No JVD LymphaticsCervical, supraclavicular, and axillary nodes normal. Resp: clear to  auscultation bilaterally and normal percussion bilaterally Cardio: regular rate and rhythm GI: soft, non-tender; bowel sounds normal; no masses,  no organomegaly Extremities: extremities normal, atraumatic, no cyanosis or edema Neuro:no sensory deficits noted Breasts: left with  unchanged tissue defect at lumpectomy scar and no dominant mass or other findings of concern. Right without findings of concern. Nothing in either axilla. No central catheter  Lab Results:  Results for orders placed in visit on 03/31/12  CBC WITH DIFFERENTIAL      Component Value Range   WBC 8.8  3.9 - 10.3 10e3/uL   NEUT# 6.9 (*) 1.5 - 6.5 10e3/uL   HGB 12.7  11.6 - 15.9 g/dL   HCT 16.1  09.6 - 04.5 %   Platelets 200  145 - 400 10e3/uL   MCV 92.7  79.5 - 101.0 fL   MCH 31.5  25.1 - 34.0 pg   MCHC 34.0  31.5 - 36.0 g/dL   RBC 4.09  8.11 - 9.14 10e6/uL   RDW 17.9 (*) 11.2 - 14.5 %   lymph# 1.2  0.9 - 3.3 10e3/uL   MONO# 0.5  0.1 - 0.9 10e3/uL   Eosinophils Absolute 0.2  0.0 - 0.5 10e3/uL   Basophils Absolute 0.1  0.0 - 0.1 10e3/uL   NEUT% 78.1 (*) 38.4 - 76.8 %   LYMPH% 13.5 (*) 14.0 - 49.7 %   MONO% 6.1  0.0 - 14.0 %   EOS% 1.7  0.0 - 7.0 %   BASO% 0.6  0.0 - 2.0 %  PHOSPHORUS      Component Value Range   Phosphorus 3.0  2.3 - 4.6 mg/dL  BILIRUBIN, DIRECT      Component Value Range   Bilirubin, Direct 0.2  0.0 - 0.3 mg/dL  LIPID PANEL      Component Value Range   Cholesterol 247 (*) 0 - 200 mg/dL   Triglycerides 782 (*) <150 mg/dL   HDL 56  >95 mg/dL   Total CHOL/HDL Ratio 4.4     VLDL 43 (*) 0 - 40 mg/dL   LDL Cholesterol 621 (*) 0 - 99 mg/dL  CK      Component Value Range   Total CK 39  7 - 177 U/L  COMPREHENSIVE METABOLIC PANEL      Component Value Range   Sodium 138  135 - 145 mEq/L   Potassium 3.5  3.5 - 5.3 mEq/L   Chloride 101  96 - 112 mEq/L   CO2 26  19 - 32 mEq/L   Glucose, Bld 122 (*) 70 - 99 mg/dL   BUN 12  6 - 23 mg/dL   Creatinine, Ser 3.08  0.50 - 1.10 mg/dL   Total Bilirubin 0.9  0.3 - 1.2 mg/dL   Alkaline Phosphatase 90  39 - 117 U/L   AST 39 (*) 0 - 37 U/L   ALT 23  0 - 35 U/L   Total Protein 6.3  6.0 - 8.3 g/dL   Albumin 3.4 (*) 3.5 - 5.2 g/dL   Calcium 9.4  8.4 - 65.7 mg/dL  URIC ACID      Component Value Range   Uric Acid, Serum 4.0   2.4 - 7.0 mg/dL    Lipids, CK, UA all per study Studies/Results:  No results found.  Medications: I have reviewed the patient's current medications. Will try restoril 15 mg at hs instead of ambien.   Will not resume everolimus at least until after PFTs, tho patient may refuse then and we  also may not be able to keep her on study even if she does not refuse, tho research RN will communicate with study chair in this regard.  She will have zometa as scheduled 04-06-12  Note she refused flu vaccine and will need tamiflu if symptoms of influenza  Assessment/Plan: 1.Metastatic breast carcinoma: on Bolero 4 study, everolimus held since 03-03-12 due to dyspnea concerning for pneumonitis from that drug, improved. Pulmonary evaluation still in process. Continuing letrozole and zometa 2.left ureteral stent in  3.osteoporosis: on IV bisphosphonate for metastatic disease now  4.DNR but support to that point  5.history of anxiety, depression, insomnia  6.previous epistaxis from everolimus, improved with dose reduction.  Will set up further visits depending on whether or not she can continue on study.  Reece Packer, MD   03/31/2012, 8:13 PM

## 2012-03-31 NOTE — Progress Notes (Signed)
VS taken 1 minute apart

## 2012-03-31 NOTE — Telephone Encounter (Signed)
RA ONO 03/19/12 >> Test time 12 hrs 40 min.  Baseline SpO2 90%, low SpO2 80%.  Spent 2 hrs 18 min with SpO2 < 88%.  Will have my nurse inform pt that oxygen level is low at night also.  She will need to wear 2 liters oxygen while asleep as well as with exertion.  Will repeat ONO with 2 liters oxygen (I have sent order to [facility]).

## 2012-04-01 NOTE — Telephone Encounter (Signed)
I spoke with patient about results and she verbalized understanding and had no questions 

## 2012-04-01 NOTE — Telephone Encounter (Signed)
lmtcb x1 

## 2012-04-06 ENCOUNTER — Ambulatory Visit (HOSPITAL_BASED_OUTPATIENT_CLINIC_OR_DEPARTMENT_OTHER): Payer: Medicare Other

## 2012-04-06 VITALS — BP 121/66 | HR 87 | Temp 97.3°F | Resp 20

## 2012-04-06 DIAGNOSIS — C7951 Secondary malignant neoplasm of bone: Secondary | ICD-10-CM

## 2012-04-06 DIAGNOSIS — C50419 Malignant neoplasm of upper-outer quadrant of unspecified female breast: Secondary | ICD-10-CM

## 2012-04-06 DIAGNOSIS — M810 Age-related osteoporosis without current pathological fracture: Secondary | ICD-10-CM

## 2012-04-06 MED ORDER — ZOLEDRONIC ACID 4 MG/100ML IV SOLN
4.0000 mg | Freq: Once | INTRAVENOUS | Status: AC
  Administered 2012-04-06: 4 mg via INTRAVENOUS
  Filled 2012-04-06: qty 100

## 2012-04-06 NOTE — Patient Instructions (Addendum)
[facility] Discharge Instructions for Patients Receiving Chemotherapy  Today you received the following chemotherapy agents zometa  To help prevent nausea and vomiting after your treatment, we encourage you to take your nausea medication  and take it as often as prescribed   If you develop nausea and vomiting that is not controlled by your nausea medication, call the clinic. If it is after clinic hours your family physician or the after hours number for the clinic or go to the Emergency Department.   BELOW ARE SYMPTOMS THAT SHOULD BE REPORTED IMMEDIATELY:  *FEVER GREATER THAN 100.5 F  *CHILLS WITH OR WITHOUT FEVER  NAUSEA AND VOMITING THAT IS NOT CONTROLLED WITH YOUR NAUSEA MEDICATION  *UNUSUAL SHORTNESS OF BREATH  *UNUSUAL BRUISING OR BLEEDING  TENDERNESS IN MOUTH AND THROAT WITH OR WITHOUT PRESENCE OF ULCERS  *URINARY PROBLEMS  *BOWEL PROBLEMS  UNUSUAL RASH Items with * indicate a potential emergency and should be followed up as soon as possible.  One of the nurses will contact you 24 hours after your treatment. Please let the nurse know about any problems that you may have experienced. Feel free to call the clinic you have any questions or concerns. The clinic phone number is [phone].   I have been informed and understand all the instructions given to me. I know to contact the clinic, my physician, or go to the Emergency Department if any problems should occur. I do not have any questions at this time, but understand that I may call the clinic during office hours   should I have any questions or need assistance in obtaining follow up care.    __________________________________________  _____________  __________ Signature of Patient or Authorized Representative            Date                   Time    __________________________________________ Nurse's Signature    

## 2012-04-07 ENCOUNTER — Telehealth: Payer: Self-pay

## 2012-04-07 NOTE — Telephone Encounter (Signed)
Nikki called to inform this nurse that Ms. Vanzandt does not want anymore scans and is withdrawing from the study. The appt. for 04-21-12 with Dr. Darrold Span can be cancelled and rescheduled  to ~feb 8 th and have Zometa set up the same day. Sent Pof to scheduling to arrange appointments.

## 2012-04-07 NOTE — Telephone Encounter (Signed)
Message copied by Lorine Bears on Wed Apr 07, 2012  3:02 PM ------      Message from: [place], Juanita Craver      Created: Sun Apr 04, 2012  3:45 PM       Lowella Bandy __  I do not want to keep her on study with just letrozole as I do not expect that she will need as many scans as we would have to do on study; I do plan to keep her on letrozole off study. Could you please let her know. Unless you need any other specific return visit dates, I will just see her back when she is due zometa again, which will be ~ Feb 11.            Sallye Ober --  If Research does not need a different apt date, please set up 30 min apt with me + cbc cmet + zometa ~ Feb 11            Thanks      Lennis

## 2012-04-09 ENCOUNTER — Telehealth: Payer: Self-pay | Admitting: *Deleted

## 2012-04-09 ENCOUNTER — Ambulatory Visit (INDEPENDENT_AMBULATORY_CARE_PROVIDER_SITE_OTHER): Payer: Medicare Other | Admitting: Pulmonary Disease

## 2012-04-09 ENCOUNTER — Telehealth: Payer: Self-pay | Admitting: Oncology

## 2012-04-09 ENCOUNTER — Encounter: Payer: Self-pay | Admitting: Pulmonary Disease

## 2012-04-09 ENCOUNTER — Ambulatory Visit (INDEPENDENT_AMBULATORY_CARE_PROVIDER_SITE_OTHER)
Admission: RE | Admit: 2012-04-09 | Discharge: 2012-04-09 | Disposition: A | Discharge status: Home / Self Care | Payer: Medicare Other | Source: Ambulatory Visit | Attending: Pulmonary Disease | Admitting: Pulmonary Disease

## 2012-04-09 VITALS — BP 130/76 | HR 95 | Temp 98.6°F | Ht 64.0 in | Wt 136.0 lb

## 2012-04-09 DIAGNOSIS — R0609 Other forms of dyspnea: Secondary | ICD-10-CM

## 2012-04-09 DIAGNOSIS — R0989 Other specified symptoms and signs involving the circulatory and respiratory systems: Secondary | ICD-10-CM

## 2012-04-09 DIAGNOSIS — R0600 Dyspnea, unspecified: Secondary | ICD-10-CM

## 2012-04-09 DIAGNOSIS — R0902 Hypoxemia: Secondary | ICD-10-CM

## 2012-04-09 LAB — PULMONARY FUNCTION TEST

## 2012-04-09 NOTE — Progress Notes (Signed)
Chief Complaint  Patient presents with  . Follow-up    w/ PFT. Has good and bad days with breathing, very little dry cough. no wheezing, chest tx. can not tell much difference in her breathing when she uses the oxygen    History of Present Illness: Kelli [name] is a 72 y.o. female never smoker with dyspnea and hypoxia.  She his here to review her PFT.  She did not bring her oxygen with her.  Her oxygen level was low on arrival to office.  She has not been using her oxygen consistently.  She wasn't sure what supplemental oxygen was doing for her.   TESTS: CT chest 02/27/12 >> post XRT changes Lt apex  V/Q scan 03/03/12 >> Very low probability for PE  Doppler legs 03/03/12 >> no DVT Rt or Lt legs  Echo 03/05/12 >> mild LVH, EF 55 to 60%, grade 1 diastolic dysfx, mild MR  03/19/12 SpO2 room air with exertion 74% >> 2 liters oxygen started RA ONO 03/19/12 >> Test time 12 hrs 40 min. Baseline SpO2 90%, low SpO2 80%. Spent 2 hrs 18 min with SpO2 < 88%. Echo with bubble study 03/25/12 >>no shunt PFT 04/09/12 >> FEV1 2.08 (103%), FEV1% 73, TLC 4.22 (88%), DLCO 66%, no BD  Past Medical History  Diagnosis Date  . Thyroid disease IN 1982    HX IODINE-131 ABLATION FOR HYPERTHYROIDISM  . Osteoporosis   . Vitamin D insufficiency   . Chronic fatigue   . Depression   . Anxiety   . Insomnia   . History of chemotherapy     Adriamycin/cytoxan followed by [facility]  08/2000/tamoxifen 06/2001-02/2003,then arimidex 02/2003-08/2009  . 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 History of radiation therapy 12/28/00-02/12/01    left breast  With Dr. Jamie Kato  . Hypothyroidism   . Fatigue   . Short of breath on exertion   . Breast cancer, left JUNE OF 2002    LEFT BREAST, ER/PR +, HER 2 -  . 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 Metastatic cancer to liver 09/24/11    CURRENTLY RECEIVING RADIATION  . Radiation 10/15/11-10/28/2011    left proximal leg    Past Surgical  History  Procedure Date  . Cholecystectomy 1970  . Femur im nail 09/24/2011 FEMURAL NECK FX/ INTERTROCHANTERIC PATHOLOGIC FX    Procedure: INTRAMEDULLARY (IM) NAIL FEMORAL;  Surgeon: Toni Arthurs, MD;  Location: [facility];  Service: Orthopedics;  Laterality: Left;  . Bone biopsy 09/25/2011    left medullary canal femur reaming=Metastatic Carcinoma  . Hysteroscopy w/d&c 01-11-2003    REMOVAL POLYP  . Left partial mastectomy / sentinel lymph node bx 08-28-2000    LEFT BREAST CANCER    Outpatient Encounter Prescriptions as of 04/09/2012  Medication Sig Dispense Refill  . diclofenac sodium (VOLTAREN) 1 % GEL Appy to left knee daily to twice a day as needed.  100 g  1  . diphenhydrAMINE (BENADRYL) 25 MG tablet Take 25 mg by mouth every 6 (six) hours as needed. 1-2 tablets as needed for itching      . FLUoxetine (PROZAC) 20 MG capsule Take 1 capsule (20 mg total) by mouth daily.  90 capsule  PRN  . HYDROcodone-acetaminophen (NORCO) 5-325 MG per tablet TAKE ONE TAB EVERY FOUR TO SIX HOURS PRN PAIN  40 tablet  0  . letrozole (FEMARA) 2.5 MG tablet Take 1 tablet (2.5 mg total) by mouth daily.  30 tablet  5  . levothyroxine (SYNTHROID, LEVOTHROID) 75 MCG tablet Take 75 mcg by mouth every morning.       . loperamide (IMODIUM A-D) 2 MG tablet Take 2 mg by mouth 4 (four) times daily as needed.      Marland Kitchen LORazepam (ATIVAN) 0.5 MG tablet Take 0.5 mg by mouth every 8 (eight) hours as needed. For anxiety      . sodium chloride (OCEAN) 0.65 % nasal spray Place 1 spray into the nose as needed (to keep nasal passages moist). Use every 1-2 hours while awake to keep nasal passages moist.  30 mL  12  . temazepam (RESTORIL) 15 MG capsule Take 1-2 tablets at bedtime as needed for sleep  30 capsule  1  . zolpidem (AMBIEN) 5 MG tablet Take 1 tablet (5 mg total) by mouth at bedtime as needed.  30 tablet  1  . [DISCONTINUED] Investigational everolimus (RAD001) 5 MG tablet Novartis [id number] Take 1 tablet by mouth daily. Take  with a glass of water.  70 tablet  0    Allergies  Allergen Reactions  . Other     "MYCIN" DRUGS  . Sulfa Antibiotics     Physical Exam:  Filed Vitals:   04/09/12 1325  Temp: 98.6 F (37 C)  TempSrc: Oral  Height: 5\' 4"  (1.626 m)  Weight: 136 lb (61.689 kg)     Current Encounter SPO2  03/19/12 1533 96%  10/30/11 1253 95%  10/30/11 1215 94%  10/30/11 1200 100%  10/30/11 1145 100%  10/30/11 0940 97%  10/30/11 1253 95%  10/30/11 1215 94%  10/30/11 1200 100%  10/30/11 1145 100%  10/30/11 0940 97%     Body mass index is 23.34 kg/(m^2).   Wt Readings from Last 2 Encounters:  04/09/12 136 lb (61.689 kg)  03/31/12 138 lb 4.8 oz (62.732 kg)     General - No distress ENT - No sinus tenderness, no oral exudate, no LAN Cardiac - s1s2 regular, no murmur Chest - No wheeze/rales/dullness Back - No focal tenderness Abd - Soft, non-tender Ext - No edema Neuro - Normal strength Skin - No rashes Psych - normal mood, and behavior   Assessment/Plan:  [name], MD [facility] Pager:  [phone] 04/09/2012, 1:28 PM

## 2012-04-09 NOTE — Telephone Encounter (Signed)
l/m with 2/11 appt,email to mw to add tx ,per louise 2/10 11 ok       anne

## 2012-04-09 NOTE — Assessment & Plan Note (Addendum)
Explained the benefit of using supplemental oxygen.  She is to continue 2 liters with exertion and sleep.  Will arrange for portable oxygen and portable pulse oximeter.  Will arrange for ONO on 2 liters.

## 2012-04-09 NOTE — Assessment & Plan Note (Signed)
She has isolated diffusion defect on PFT.  Remainder of evaluation has been unrevealing.  Will repeat chest xray and monitor symptoms status.

## 2012-04-09 NOTE — Progress Notes (Signed)
PFT done today. 

## 2012-04-09 NOTE — Patient Instructions (Signed)
Chest xray today  Follow up in 6 weeks 

## 2012-04-09 NOTE — Telephone Encounter (Signed)
Per staff message and POF I have scheduled appts.  JMW  

## 2012-04-13 ENCOUNTER — Telehealth: Payer: Self-pay | Admitting: Pulmonary Disease

## 2012-04-13 NOTE — Telephone Encounter (Signed)
04/09/2012  *RADIOLOGY REPORT*  Clinical Data: Shortness of breath.  CHEST - 2 VIEW   Comparison: 03/03/2012   Findings: Heart is normal size.  No focal airspace opacities or effusions.  Healing bilateral rib fractures, stable.  Surgical clips in the left axilla.  Probable prior left to mastectomy.   IMPRESSION: No acute cardiopulmonary disease.    Original Report Authenticated By: Charlett Nose, M.D.     Will have my nurse inform patient that CXR was normal.  No change to current treatment plan.

## 2012-04-13 NOTE — Telephone Encounter (Signed)
lmtcb x1 

## 2012-04-15 NOTE — Telephone Encounter (Signed)
lmomtcb x 2  

## 2012-04-16 NOTE — Telephone Encounter (Signed)
I spoke with patient about results and she verbalized understanding and had no questions 

## 2012-04-16 NOTE — Telephone Encounter (Signed)
Returning call can be reached at [phone].Kelli [name]

## 2012-04-20 ENCOUNTER — Telehealth: Payer: Self-pay | Admitting: Pulmonary Disease

## 2012-04-20 NOTE — Telephone Encounter (Signed)
ONO with 2 liters 04/17/12 >> Test time 11 hrs 38 min.  Mean SpO2 97%, low SpO2 low 93%.    Will have my nurse inform pt that oxygen test looked good with 2 liters oxygen.  No change to current set up.

## 2012-04-21 ENCOUNTER — Ambulatory Visit: Payer: Medicare Other | Admitting: Oncology

## 2012-04-21 ENCOUNTER — Other Ambulatory Visit: Payer: Medicare Other | Admitting: Lab

## 2012-04-21 NOTE — Telephone Encounter (Signed)
I spoke with patient about results and she verbalized understanding and had no questions 

## 2012-04-21 NOTE — Telephone Encounter (Signed)
lmomtcb x1 for pt 

## 2012-04-28 ENCOUNTER — Telehealth: Payer: Self-pay | Admitting: *Deleted

## 2012-04-28 NOTE — Telephone Encounter (Signed)
Faxed refill request for Fluoxetine back to [facility] with instructions from md re:  Pharmacy needs to contact pt's primary md for refill of meds ( as per Dr. Darrold Span ). [facility]  Fax    [phone].

## 2012-04-29 ENCOUNTER — Encounter: Payer: Self-pay | Admitting: Pulmonary Disease

## 2012-04-30 ENCOUNTER — Ambulatory Visit: Payer: Medicare Other | Admitting: Oncology

## 2012-04-30 ENCOUNTER — Other Ambulatory Visit: Payer: Medicare Other | Admitting: Lab

## 2012-05-04 ENCOUNTER — Telehealth: Payer: Self-pay | Admitting: *Deleted

## 2012-05-04 ENCOUNTER — Ambulatory Visit (HOSPITAL_BASED_OUTPATIENT_CLINIC_OR_DEPARTMENT_OTHER): Payer: Medicare Other

## 2012-05-04 ENCOUNTER — Other Ambulatory Visit (HOSPITAL_BASED_OUTPATIENT_CLINIC_OR_DEPARTMENT_OTHER): Payer: Medicare Other | Admitting: Lab

## 2012-05-04 ENCOUNTER — Other Ambulatory Visit: Payer: Self-pay | Admitting: *Deleted

## 2012-05-04 ENCOUNTER — Telehealth: Payer: Self-pay | Admitting: Oncology

## 2012-05-04 ENCOUNTER — Ambulatory Visit (HOSPITAL_BASED_OUTPATIENT_CLINIC_OR_DEPARTMENT_OTHER): Payer: Medicare Other | Admitting: Oncology

## 2012-05-04 ENCOUNTER — Encounter: Payer: Self-pay | Admitting: *Deleted

## 2012-05-04 VITALS — BP 131/63 | HR 111 | Temp 97.2°F | Resp 18 | Ht 64.0 in | Wt 135.4 lb

## 2012-05-04 DIAGNOSIS — C7951 Secondary malignant neoplasm of bone: Secondary | ICD-10-CM

## 2012-05-04 DIAGNOSIS — C50419 Malignant neoplasm of upper-outer quadrant of unspecified female breast: Secondary | ICD-10-CM

## 2012-05-04 DIAGNOSIS — C50919 Malignant neoplasm of unspecified site of unspecified female breast: Secondary | ICD-10-CM

## 2012-05-04 DIAGNOSIS — C787 Secondary malignant neoplasm of liver and intrahepatic bile duct: Secondary | ICD-10-CM

## 2012-05-04 DIAGNOSIS — M810 Age-related osteoporosis without current pathological fracture: Secondary | ICD-10-CM

## 2012-05-04 DIAGNOSIS — C7952 Secondary malignant neoplasm of bone marrow: Secondary | ICD-10-CM

## 2012-05-04 LAB — CBC WITH DIFFERENTIAL/PLATELET
EOS%: 1.2 % (ref 0.0–7.0)
Eosinophils Absolute: 0.1 10*3/uL (ref 0.0–0.5)
MCV: 95 fL (ref 79.5–101.0)
MONO%: 8 % (ref 0.0–14.0)
NEUT#: 4.7 10*3/uL (ref 1.5–6.5)
RBC: 3.81 10*6/uL (ref 3.70–5.45)
RDW: 17.4 % — ABNORMAL HIGH (ref 11.2–14.5)

## 2012-05-04 LAB — COMPREHENSIVE METABOLIC PANEL (CC13)
ALT: 24 U/L (ref 0–55)
AST: 38 U/L — ABNORMAL HIGH (ref 5–34)
Albumin: 3.4 g/dL — ABNORMAL LOW (ref 3.5–5.0)
Alkaline Phosphatase: 93 U/L (ref 40–150)
Glucose: 100 mg/dl — ABNORMAL HIGH (ref 70–99)
Potassium: 4 mEq/L (ref 3.5–5.1)
Sodium: 140 mEq/L (ref 136–145)
Total Protein: 6.5 g/dL (ref 6.4–8.3)

## 2012-05-04 MED ORDER — FLUOXETINE HCL 20 MG PO CAPS: 20.0000 mg | ORAL_CAPSULE | Freq: Every day | ORAL | Status: DC

## 2012-05-04 MED ORDER — SODIUM CHLORIDE 0.9 % IV SOLN
Freq: Once | INTRAVENOUS | Status: AC
  Administered 2012-05-04: 15:00:00 via INTRAVENOUS

## 2012-05-04 MED ORDER — ZOLEDRONIC ACID 4 MG/100ML IV SOLN
4.0000 mg | Freq: Once | INTRAVENOUS | Status: AC
  Administered 2012-05-04: 4 mg via INTRAVENOUS
  Filled 2012-05-04: qty 100

## 2012-05-04 NOTE — Patient Instructions (Addendum)
Zoledronic Acid injection (Hypercalcemia, Oncology) What is this medicine? ZOLEDRONIC ACID (ZOE le dron ik AS id) lowers the amount of calcium loss from bone. It is used to treat too much calcium in your blood from cancer. It is also used to prevent complications of cancer that has spread to the bone. This medicine may be used for other purposes; ask your health care provider or pharmacist if you have questions. What should I tell my health care provider before I take this medicine? They need to know if you have any of these conditions: -aspirin-sensitive asthma -dental disease -kidney disease -an unusual or allergic reaction to zoledronic acid, other medicines, foods, dyes, or preservatives -pregnant or trying to get pregnant -breast-feeding How should I use this medicine? This medicine is for infusion into a vein. It is given by a health care professional in a hospital or clinic setting. Talk to your pediatrician regarding the use of this medicine in children. Special care may be needed. Overdosage: If you think you have taken too much of this medicine contact a poison control center or emergency room at once. NOTE: This medicine is only for you. Do not share this medicine with others. What if I miss a dose? It is important not to miss your dose. Call your doctor or health care professional if you are unable to keep an appointment. What may interact with this medicine? -certain antibiotics given by injection -NSAIDs, medicines for pain and inflammation, like ibuprofen or naproxen -some diuretics like bumetanide, furosemide -teriparatide -thalidomide This list may not describe all possible interactions. Give your health care provider a list of all the medicines, herbs, non-prescription drugs, or dietary supplements you use. Also tell them if you smoke, drink alcohol, or use illegal drugs. Some items may interact with your medicine. What should I watch for while using this medicine? Visit  your doctor or health care professional for regular checkups. It may be some time before you see the benefit from this medicine. Do not stop taking your medicine unless your doctor tells you to. Your doctor may order blood tests or other tests to see how you are doing. Women should inform their doctor if they wish to become pregnant or think they might be pregnant. There is a potential for serious side effects to an unborn child. Talk to your health care professional or pharmacist for more information. You should make sure that you get enough calcium and vitamin D while you are taking this medicine. Discuss the foods you eat and the vitamins you take with your health care professional. Some people who take this medicine have severe bone, joint, and/or muscle pain. This medicine may also increase your risk for a broken thigh bone. Tell your doctor right away if you have pain in your upper leg or groin. Tell your doctor if you have any pain that does not go away or that gets worse. What side effects may I notice from receiving this medicine? Side effects that you should report to your doctor or health care professional as soon as possible: -allergic reactions like skin rash, itching or hives, swelling of the face, lips, or tongue -anxiety, confusion, or depression -breathing problems -changes in vision -feeling faint or lightheaded, falls -jaw burning, cramping, pain -muscle cramps, stiffness, or weakness -trouble passing urine or change in the amount of urine Side effects that usually do not require medical attention (report to your doctor or health care professional if they continue or are bothersome): -bone, joint, or muscle pain -  fever -hair loss -irritation at site where injected -loss of appetite -nausea, vomiting -stomach upset -tired This list may not describe all possible side effects. Call your doctor for medical advice about side effects. You may report side effects to FDA at  1-800-FDA-1088. Where should I keep my medicine? This drug is given in a hospital or clinic and will not be stored at home. NOTE: This sheet is a summary. It may not cover all possible information. If you have questions about this medicine, talk to your doctor, pharmacist, or health care provider.  2013, [employer]. (09/06/2010 9:06:58 AM)  

## 2012-05-04 NOTE — Telephone Encounter (Signed)
Per staff message and POF I have scheduled appts.  JMW  

## 2012-05-04 NOTE — Telephone Encounter (Signed)
Gave pt appt for lab and MD, for March and April 2014, emailed Marcelino Duster regarding chemo

## 2012-05-04 NOTE — Progress Notes (Signed)
05/04/12 at 2:17pm - [employer]art[employer]/Bolero 4 - 28 day Safety Follow Up Visit- The pt was into the cancer center today for her follow up visit after stopping study drug, letrozole, on 04/06/12.  The pt states she is doing well.  The pt was seen and evaluated by Dr. Darrold Span today.  The pt confirmed that she is continuing to take her off-study letrozole 2.5mg  dose daily.  The pt reports the following adverse events as ongoing at this vist:  fatigue, intermittent nausea, and intermittent dyspnea.  She states that her constipation has resolved, and she is no longer taking her Sennokot S.  She specifically denies any coughing, new respiratory problems and pain.  She does report the following new adverse events: mild palpitations (started "1 week ago") and hot flashes (grade 2) that started "1 month ago" per pt report.   She also has noted some tachycardic episodes at home that started about a week ago.  The pt's concomitant medication log was reviewed with the patient today.  She reports the following medications as ongoing:  Prozac, synthroid, ativan, ambien, sodium chloride nasal spray, norco/vicodin, oxygen therapy, and restoril.   She denies the need for the diphenhydramine.  The pt is receiving her monthly Zometa infusion today.  The pt is aware that she will be followed for survival and for any new antineoplastic treatments.  The pt thanked the research nurse for allowing her to take part in the research study.  The pt said that she regrets that she had to come off of the everolimus.  The pt was thanked for her continued support of this study.

## 2012-05-04 NOTE — Progress Notes (Signed)
OFFICE PROGRESS NOTE   05/04/2012   Physicians:J.Hewitt, J.Kinard, J.Beekman, W.Elkins, M.Altheimer, S.Dahlstedt, C.Ezzard Standing, V. Sood    INTERVAL HISTORY:   Patient is seen, together with husband, in continuing attention to her metastatic breast cancer involving bone and liver, continuing letrozole and zometa off Bolero 4 study. Dr Craige Cotta is following for pulmonary symptoms which are thought secondary to previous everolimus.  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 She was treated with lumpectomy and 18 node axillary dissection, adjuvant adriamycin/cytoxan followed by [facility] chemotherapy, local radiation, tamoxifen from April 2003 thru Dec 2003 then arimidex from Dec 2004 thru June 2011. The arimidex had been continued for that duration due to high risk features, stopped as there had been no evidence of disease and with known osteoporosis. She was clinically stable and labs were normal other than very slight decrease in hemoglobin when I saw her in Jan 2013. She developed acute pain in left hip in June 2013, with MRI hip 09-20-11 showing metastatic disease with pathologic fracture of femoral neck, with nailing by Dr Victorino Dike on 09-24-11. Pathology confirmed metastatic breast carcinoma, ER+ 100%, PR negative and HER 2 negative by [facility]. She had left ureteral stent by Dr Retta Diones 11-01-11 for obstructive hydronephrosis. She received radiation 3000 cGy in 10 fractions to left femur 7-24 thru 10-28-11 by Dr Roselind Messier. Bolero 4 study was started 11-12-11. Everolimus dose was reduced due to epistaxis. Restaging scans done per protocol in Dec 2013 (bone scan, CT chest, MRI abdomen and pelvis) all showed further improvement in metastatic disease, without other progression. Everolimus was discontinued after 03-03-12 due to dyspnea. Respiratory problems were evaluated with VQ, echocardiogram and bubble echo, and  PFTs by Dr Craige Cotta.Dyspnea has improved significantly but has not completely resolved, with O2 desaturation with exertion documented in mid January. Patient has [place] O2 at home, which she is to use with exertion and sleep, tho she is very reluctant to use this. Home finger O2 monitor is consistently >95%, however she tells me that she "huffs and puffs" and her heart rate goes up to 110 - 150 including after showering this AM.   Otherwise feeling very well. Very slight blood from nose in AMs when stuffy. No other bleeding, No cough or chest pain. No other pain and has not used pain medication in ~ a week. Appetite good, bowels moving regularly, no fever or symptoms of infection, energy good, no swelling LE. She was not able to sleep with 15 mg restoril but will try this at 30 mg si nce insurance is limiting ambien. Remainder of 10 point Review of Systems negative.  Objective:  Vital signs in last 24 hours:  BP 131/63  Pulse 111  Temp(Src) 97.2 F (36.2 C) (Oral)  Resp 18  Ht 5\' 4"  (1.626 m)  Wt 135 lb 6.4 oz (61.417 kg)  BMI 23.23 kg/m2  Alert, talkative, looks comfortable in exam room.  HEENT:PERRLA, sclera clear, anicteric and oropharynx clear, no lesions LymphaticsCervical, supraclavicular, and axillary nodes normal. Resp: clear to auscultation bilaterally and normal percussion bilaterally Cardio: regular rate and rhythm GI: soft, non-tender; bowel sounds normal; no masses,  no organomegaly Extremities: extremities normal, atraumatic, no cyanosis or edema Neuro:nonfocal Breasts: left with lateral tissue defect, no nodularity or other findings of concern, left axilla and LUE not remarkable. Right without dominant mass, skin or nipple findings, axilla benign. Portacath without erythema or tenderness,  used for zometa today.  Lab Results:  Results for orders placed in visit on 05/04/12  CBC WITH DIFFERENTIAL      Result Value Range   WBC 7.6  3.9 - 10.3 10e3/uL   NEUT# 4.7  1.5 - 6.5  10e3/uL   HGB 12.1  11.6 - 15.9 g/dL   HCT 16.1  09.6 - 04.5 %   Platelets 149  145 - 400 10e3/uL   MCV 95.0  79.5 - 101.0 fL   MCH 31.8  25.1 - 34.0 pg   MCHC 33.4  31.5 - 36.0 g/dL   RBC 4.09  8.11 - 9.14 10e6/uL   RDW 17.4 (*) 11.2 - 14.5 %   lymph# 2.2  0.9 - 3.3 10e3/uL   MONO# 0.6  0.1 - 0.9 10e3/uL   Eosinophils Absolute 0.1  0.0 - 0.5 10e3/uL   Basophils Absolute 0.0  0.0 - 0.1 10e3/uL   NEUT% 61.2  38.4 - 76.8 %   LYMPH% 29.3  14.0 - 49.7 %   MONO% 8.0  0.0 - 14.0 %   EOS% 1.2  0.0 - 7.0 %   BASO% 0.3  0.0 - 2.0 %    CMET with glucose 100, AST 38 and alb 3.4 otherwise normal  Studies/Results:  No results found.  Medications: I have reviewed the patient's current medications. She will continue letrozole and zometa  Assessment/Plan: 1.metastatic breast carcinoma to liver and bone: off Bolero 4 study due to pulmonary complications of everolimus. Continuing letrozole with monthly zometa. She will have zometa in 4 weeks and I will see her with zometa in 8 weeks, or sooner if needed. She will need stat chemistries likely from PAC days of zometa if these have not been done otherwise prior to each treatment.  2.PAC in 3.rash to IV contrast despite benadryl 4. left ureteral stent in : will send most recent scan information (Dec) to Dr Retta Diones; I believe decision re stent change to be based on scans ~ Feb, however no scans now as she is off study. 5.osteoporosis: now on IV bisphosphonate with the aromatase inhibitor 6.DNR but support to that point  Reece Packer, MD   05/04/2012, 1:42 PM

## 2012-05-06 ENCOUNTER — Encounter: Payer: Self-pay | Admitting: Oncology

## 2012-05-10 ENCOUNTER — Telehealth: Payer: Self-pay | Admitting: Oncology

## 2012-05-10 NOTE — Telephone Encounter (Signed)
Called pt and left message regarding labs and and MD on 06/01/12 advised pt to get calendar for APril 201

## 2012-05-20 ENCOUNTER — Other Ambulatory Visit: Payer: Self-pay | Admitting: *Deleted

## 2012-05-20 DIAGNOSIS — C7951 Secondary malignant neoplasm of bone: Secondary | ICD-10-CM

## 2012-05-20 MED ORDER — TEMAZEPAM 15 MG PO CAPS: ORAL_CAPSULE | ORAL | Status: DC

## 2012-05-21 ENCOUNTER — Ambulatory Visit: Payer: Medicare Other | Admitting: Pulmonary Disease

## 2012-06-01 ENCOUNTER — Other Ambulatory Visit: Payer: Medicare Other | Admitting: Lab

## 2012-06-01 ENCOUNTER — Ambulatory Visit: Payer: Medicare Other

## 2012-06-01 ENCOUNTER — Telehealth: Payer: Self-pay

## 2012-06-01 NOTE — Telephone Encounter (Signed)
Left message for Kelli [name] that she can call back and ask for scheduling to r/s her zometa which she missed today.

## 2012-06-02 ENCOUNTER — Telehealth: Payer: Self-pay | Admitting: Oncology

## 2012-06-04 ENCOUNTER — Ambulatory Visit (HOSPITAL_BASED_OUTPATIENT_CLINIC_OR_DEPARTMENT_OTHER): Payer: Medicare Other

## 2012-06-04 ENCOUNTER — Other Ambulatory Visit (HOSPITAL_BASED_OUTPATIENT_CLINIC_OR_DEPARTMENT_OTHER): Payer: Medicare Other | Admitting: Lab

## 2012-06-04 VITALS — BP 117/52 | HR 73 | Temp 98.2°F

## 2012-06-04 DIAGNOSIS — C50919 Malignant neoplasm of unspecified site of unspecified female breast: Secondary | ICD-10-CM

## 2012-06-04 DIAGNOSIS — C50419 Malignant neoplasm of upper-outer quadrant of unspecified female breast: Secondary | ICD-10-CM

## 2012-06-04 DIAGNOSIS — C7951 Secondary malignant neoplasm of bone: Secondary | ICD-10-CM

## 2012-06-04 LAB — BASIC METABOLIC PANEL (CC13)
Calcium: 9.2 mg/dL (ref 8.4–10.4)
Creatinine: 0.8 mg/dL (ref 0.6–1.1)
Glucose: 99 mg/dl (ref 70–99)
Sodium: 141 mEq/L (ref 136–145)

## 2012-06-04 LAB — CBC WITH DIFFERENTIAL/PLATELET
EOS%: 0.9 % (ref 0.0–7.0)
MCH: 32.6 pg (ref 25.1–34.0)
MCV: 97.8 fL (ref 79.5–101.0)
MONO%: 8.7 % (ref 0.0–14.0)
RBC: 3.45 10*6/uL — ABNORMAL LOW (ref 3.70–5.45)
RDW: 16.9 % — ABNORMAL HIGH (ref 11.2–14.5)

## 2012-06-04 LAB — CANCER ANTIGEN 27.29: CA 27.29: 299 U/mL — ABNORMAL HIGH (ref 0–39)

## 2012-06-04 MED ORDER — ZOLEDRONIC ACID 4 MG/5ML IV CONC
4.0000 mg | Freq: Once | INTRAVENOUS | Status: AC
  Administered 2012-06-04: 4 mg via INTRAVENOUS
  Filled 2012-06-04: qty 5

## 2012-06-04 NOTE — Patient Instructions (Addendum)
Zoledronic Acid injection (Hypercalcemia, Oncology) What is this medicine? ZOLEDRONIC ACID (ZOE le dron ik AS id) lowers the amount of calcium loss from bone. It is used to treat too much calcium in your blood from cancer. It is also used to prevent complications of cancer that has spread to the bone. This medicine may be used for other purposes; ask your health care provider or pharmacist if you have questions. What should I tell my health care provider before I take this medicine? They need to know if you have any of these conditions: -aspirin-sensitive asthma -dental disease -kidney disease -an unusual or allergic reaction to zoledronic acid, other medicines, foods, dyes, or preservatives -pregnant or trying to get pregnant -breast-feeding How should I use this medicine? This medicine is for infusion into a vein. It is given by a health care professional in a hospital or clinic setting. Talk to your pediatrician regarding the use of this medicine in children. Special care may be needed. Overdosage: If you think you have taken too much of this medicine contact a poison control center or emergency room at once. NOTE: This medicine is only for you. Do not share this medicine with others. What if I miss a dose? It is important not to miss your dose. Call your doctor or health care professional if you are unable to keep an appointment. What may interact with this medicine? -certain antibiotics given by injection -NSAIDs, medicines for pain and inflammation, like ibuprofen or naproxen -some diuretics like bumetanide, furosemide -teriparatide -thalidomide This list may not describe all possible interactions. Give your health care provider a list of all the medicines, herbs, non-prescription drugs, or dietary supplements you use. Also tell them if you smoke, drink alcohol, or use illegal drugs. Some items may interact with your medicine. What should I watch for while using this medicine? Visit  your doctor or health care professional for regular checkups. It may be some time before you see the benefit from this medicine. Do not stop taking your medicine unless your doctor tells you to. Your doctor may order blood tests or other tests to see how you are doing. Women should inform their doctor if they wish to become pregnant or think they might be pregnant. There is a potential for serious side effects to an unborn child. Talk to your health care professional or pharmacist for more information. You should make sure that you get enough calcium and vitamin D while you are taking this medicine. Discuss the foods you eat and the vitamins you take with your health care professional. Some people who take this medicine have severe bone, joint, and/or muscle pain. This medicine may also increase your risk for a broken thigh bone. Tell your doctor right away if you have pain in your upper leg or groin. Tell your doctor if you have any pain that does not go away or that gets worse. What side effects may I notice from receiving this medicine? Side effects that you should report to your doctor or health care professional as soon as possible: -allergic reactions like skin rash, itching or hives, swelling of the face, lips, or tongue -anxiety, confusion, or depression -breathing problems -changes in vision -feeling faint or lightheaded, falls -jaw burning, cramping, pain -muscle cramps, stiffness, or weakness -trouble passing urine or change in the amount of urine Side effects that usually do not require medical attention (report to your doctor or health care professional if they continue or are bothersome): -bone, joint, or muscle pain -  fever -hair loss -irritation at site where injected -loss of appetite -nausea, vomiting -stomach upset -tired This list may not describe all possible side effects. Call your doctor for medical advice about side effects. You may report side effects to FDA at  1-800-FDA-1088. Where should I keep my medicine? This drug is given in a hospital or clinic and will not be stored at home. NOTE: This sheet is a summary. It may not cover all possible information. If you have questions about this medicine, talk to your doctor, pharmacist, or health care provider.  2013, [employer]. (09/06/2010 9:06:58 AM)  

## 2012-06-22 ENCOUNTER — Other Ambulatory Visit: Payer: Self-pay | Admitting: *Deleted

## 2012-06-22 DIAGNOSIS — C7951 Secondary malignant neoplasm of bone: Secondary | ICD-10-CM

## 2012-06-22 MED ORDER — TEMAZEPAM 15 MG PO CAPS: ORAL_CAPSULE | ORAL | Status: DC

## 2012-06-27 ENCOUNTER — Other Ambulatory Visit: Payer: Self-pay | Admitting: Oncology

## 2012-06-27 DIAGNOSIS — C50919 Malignant neoplasm of unspecified site of unspecified female breast: Secondary | ICD-10-CM

## 2012-06-28 ENCOUNTER — Other Ambulatory Visit: Payer: Self-pay | Admitting: Oncology

## 2012-06-29 ENCOUNTER — Other Ambulatory Visit: Payer: Self-pay | Admitting: Oncology

## 2012-06-29 ENCOUNTER — Ambulatory Visit (HOSPITAL_BASED_OUTPATIENT_CLINIC_OR_DEPARTMENT_OTHER): Payer: Medicare Other

## 2012-06-29 ENCOUNTER — Ambulatory Visit (HOSPITAL_BASED_OUTPATIENT_CLINIC_OR_DEPARTMENT_OTHER): Payer: Medicare Other | Admitting: Oncology

## 2012-06-29 ENCOUNTER — Other Ambulatory Visit (HOSPITAL_BASED_OUTPATIENT_CLINIC_OR_DEPARTMENT_OTHER): Payer: Medicare Other | Admitting: Lab

## 2012-06-29 ENCOUNTER — Encounter: Payer: Self-pay | Admitting: Oncology

## 2012-06-29 ENCOUNTER — Ambulatory Visit (HOSPITAL_COMMUNITY)
Admission: RE | Admit: 2012-06-29 | Discharge: 2012-06-29 | Disposition: A | Discharge status: Home / Self Care | Payer: Medicare Other | Source: Ambulatory Visit | Attending: Oncology | Admitting: Oncology

## 2012-06-29 ENCOUNTER — Telehealth: Payer: Self-pay | Admitting: Oncology

## 2012-06-29 VITALS — BP 137/77 | HR 95 | Temp 98.2°F | Resp 18 | Ht 64.0 in | Wt 133.8 lb

## 2012-06-29 DIAGNOSIS — C7951 Secondary malignant neoplasm of bone: Secondary | ICD-10-CM

## 2012-06-29 DIAGNOSIS — M25569 Pain in unspecified knee: Secondary | ICD-10-CM | POA: Insufficient documentation

## 2012-06-29 DIAGNOSIS — C50419 Malignant neoplasm of upper-outer quadrant of unspecified female breast: Secondary | ICD-10-CM

## 2012-06-29 DIAGNOSIS — C50919 Malignant neoplasm of unspecified site of unspecified female breast: Secondary | ICD-10-CM

## 2012-06-29 DIAGNOSIS — C7952 Secondary malignant neoplasm of bone marrow: Secondary | ICD-10-CM | POA: Insufficient documentation

## 2012-06-29 DIAGNOSIS — M1710 Unilateral primary osteoarthritis, unspecified knee: Secondary | ICD-10-CM | POA: Insufficient documentation

## 2012-06-29 DIAGNOSIS — M810 Age-related osteoporosis without current pathological fracture: Secondary | ICD-10-CM

## 2012-06-29 DIAGNOSIS — C787 Secondary malignant neoplasm of liver and intrahepatic bile duct: Secondary | ICD-10-CM

## 2012-06-29 DIAGNOSIS — M899 Disorder of bone, unspecified: Secondary | ICD-10-CM | POA: Insufficient documentation

## 2012-06-29 DIAGNOSIS — M1712 Unilateral primary osteoarthritis, left knee: Secondary | ICD-10-CM

## 2012-06-29 DIAGNOSIS — M898X9 Other specified disorders of bone, unspecified site: Secondary | ICD-10-CM | POA: Insufficient documentation

## 2012-06-29 LAB — CBC WITH DIFFERENTIAL/PLATELET
BASO%: 0.2 % (ref 0.0–2.0)
HCT: 34.9 % (ref 34.8–46.6)
HGB: 11.4 g/dL — ABNORMAL LOW (ref 11.6–15.9)
MCHC: 32.7 g/dL (ref 31.5–36.0)
MONO#: 0.7 10*3/uL (ref 0.1–0.9)
NEUT%: 54.9 % (ref 38.4–76.8)
RDW: 16 % — ABNORMAL HIGH (ref 11.2–14.5)
WBC: 9.2 10*3/uL (ref 3.9–10.3)
lymph#: 3.2 10*3/uL (ref 0.9–3.3)

## 2012-06-29 LAB — COMPREHENSIVE METABOLIC PANEL
ALT: 27 U/L (ref 0–35)
AST: 52 U/L — ABNORMAL HIGH (ref 0–37)
Albumin: 3.5 g/dL (ref 3.5–5.2)
CO2: 26 mEq/L (ref 19–32)
Calcium: 10 mg/dL (ref 8.4–10.5)
Chloride: 102 mEq/L (ref 96–112)
Creatinine, Ser: 0.81 mg/dL (ref 0.50–1.10)
Potassium: 3.8 mEq/L (ref 3.5–5.3)
Total Protein: 6.6 g/dL (ref 6.0–8.3)

## 2012-06-29 LAB — CANCER ANTIGEN 27.29: CA 27.29: 448 U/mL — ABNORMAL HIGH (ref 0–39)

## 2012-06-29 MED ORDER — ZOLPIDEM TARTRATE 5 MG PO TABS: 5.0000 mg | ORAL_TABLET | Freq: Every evening | ORAL | Status: DC | PRN

## 2012-06-29 MED ORDER — ZOLEDRONIC ACID 4 MG/100ML IV SOLN
4.0000 mg | Freq: Once | INTRAVENOUS | Status: AC
  Administered 2012-06-29: 4 mg via INTRAVENOUS
  Filled 2012-06-29: qty 100

## 2012-06-29 NOTE — Progress Notes (Signed)
OFFICE PROGRESS NOTE   06/29/2012   Physicians:J.Hewitt, J.Kinard, J.Beekman, W.Elkins, M.Altheimer, S.Dahlstedt, C.Ezzard Standing, V. 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 She has been feeling very well other than discomfort left knee and chronic difficulty sleeping. She no longer notices any SOB and is not using the home O2. She does not have PAC.  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 She was treated with lumpectomy and 18 node axillary dissection, adjuvant adriamycin/cytoxan followed by [facility] chemotherapy, local radiation, tamoxifen from April 2003 thru Dec 2003 then arimidex from Dec 2004 thru June 2011. The arimidex had been continued for that duration due to high risk features, stopped as there had been no evidence of disease and with known osteoporosis. She was clinically stable and labs were normal other than very slight decrease in hemoglobin when I saw her in Jan 2013. She developed acute pain in left hip in June 2013, with MRI hip 09-20-11 showing metastatic disease with pathologic fracture of femoral neck, with nailing by Dr Victorino Dike on 09-24-11. Pathology confirmed metastatic breast carcinoma, ER+ 100%, PR negative and HER 2 negative by [facility]. She had left ureteral stent by Dr Retta Diones 11-01-11 for obstructive hydronephrosis. She received radiation 3000 cGy in 10 fractions to left femur 7-24 thru 10-28-11 by Dr Roselind Messier. Bolero 4 study was started 11-12-11. Everolimus dose was reduced due to epistaxis. Restaging scans done per protocol in Dec 2013 (bone scan, CT chest, MRI abdomen and pelvis) all showed further improvement in metastatic disease, without other progression. Everolimus was discontinued  after 03-03-12 due to dyspnea. Respiratory problems were evaluated with VQ, echocardiogram and bubble echo, and PFTs by Dr Craige Cotta. As above, she no longer notices any SOB.  Patient notices mild queasiness in afternoons, better with "half of a pain pill" and also uses antiemetic at hs. She has not been taking Protonix recently and will resume that. Left knee has been more uncomfortable and puffy, and some swelling left lower leg at times, no trauma. She denies other bone pain. She is sleeping adequately with Restoril + "half of ambien" at hs. No bleeding. No fever or symptoms of infection. Remainder of 10 point Review of Systems negative.  Objective:  Vital signs in last 24 hours:  BP 137/77  Pulse 95  Temp(Src) 98.2 F (36.8 C) (Oral)  Resp 18  Ht 5\' 4"  (1.626 m)  Wt 133 lb 12.8 oz (60.691 kg)  BMI 22.96 kg/m2  Weight is down ~ one lb. Alert, ambulatory without assistance and otherwise easily mobile, looks good. Talkative and very pleasant.  HEENT:PERRLA, sclera clear, anicteric and oropharynx clear, no lesions LymphaticsCervical, supraclavicular, and axillary nodes normal. Resp: clear to auscultation bilaterally and normal percussion bilaterally Cardio: regular rate and rhythm GI: soft, non-tender; bowel sounds normal; no masses,  no organomegaly Extremities: extremities normal, atraumatic, no cyanosis or edema other than slight swelling medial left knee without erythema or heat. No pitting edema or cords lower legs. Neuro:no sensory deficits noted Breast:left with tissue defect and stable scarring upper and outer quadrants, no dominant mass or skin/nipple findings otherwise bilaterally. No central catheter.  Lab Results: Available after visit: CBC with WBC 9.2, Hgb stable at 11.4 and platelets a little lower  at 121k. CMET normal with exception of nonfasting glucose of 117 and AST 52, this having been 38 and 39 on last 2 labs. CA 2729 also available after visit is higher at 448, this  having been 299 in March and 224 in Dec.   Studies/Results: LEFT KNEE - COMPLETE 4+ VIEW 06-29-12 Comparison: 12/20/2003. MRI 08/10/2008.  Findings: The distal portion of intramedullary rod is seen in the  femur with to distal transverse screws. There is osteopenic  appearance of bones overall. Nonaneurysmal arterial calcifications  are present.  There is narrowing of the medial joint space with marginal  osteophyte formation. No definite joint effusion is seen. There  is minimal posterior patellar marginal osteophyte formation. No  opaque loose body is seen. No chondrocalcinosis is evident.  IMPRESSION:  Previous ORIF of femur. No disruption of hardware is evident.  Osteopenic appearance of bones. Nonaneurysmal arterial  calcifications.  Narrowing of the medial joint space of the the with marginal  osteophyte formation consistent with osteoarthritic changes.  Posterior patellar spurring.    Medications: I have reviewed the patient's current medications.  Assessment/Plan:  1.metastatic breast carcinoma to liver and bone: off Bolero 4 study due to pulmonary complications of everolimus, continuing letrozole with monthly zometa. CA 2729 marker increased and other lab findings as above, tho patient is feeling very well. She will have repeat labs with zometa in 4 weeks and I will see her then.  Obviously if we can tell progression on letrozole will need to change therapy. 2.left knee pain: plain xrays after visit show degenerative changes but no metastatic disease there. She will call Dr Hewitt's office. 3.rash to IV contrast despite benadryl  4. left ureteral stent in : will send most recent scan information (Dec) to Dr Retta Diones; I believe decision re stent change was to be based on scans ~ Feb, however no scans required then as she is off study. She may need follow up appointment set up at that office. 5.osteoporosis: now on IV bisphosphonate with the aromatase inhibitor  6.DNR but  support to that point     Reece Packer, MD   06/29/2012, 2:06 PM

## 2012-06-29 NOTE — Patient Instructions (Signed)
Zoledronic Acid injection (Hypercalcemia, Oncology) What is this medicine? ZOLEDRONIC ACID (ZOE le dron ik AS id) lowers the amount of calcium loss from bone. It is used to treat too much calcium in your blood from cancer. It is also used to prevent complications of cancer that has spread to the bone. This medicine may be used for other purposes; ask your health care provider or pharmacist if you have questions. What should I tell my health care provider before I take this medicine? They need to know if you have any of these conditions: -aspirin-sensitive asthma -dental disease -kidney disease -an unusual or allergic reaction to zoledronic acid, other medicines, foods, dyes, or preservatives -pregnant or trying to get pregnant -breast-feeding How should I use this medicine? This medicine is for infusion into a vein. It is given by a health care professional in a hospital or clinic setting. Talk to your pediatrician regarding the use of this medicine in children. Special care may be needed. Overdosage: If you think you have taken too much of this medicine contact a poison control center or emergency room at once. NOTE: This medicine is only for you. Do not share this medicine with others. What if I miss a dose? It is important not to miss your dose. Call your doctor or health care professional if you are unable to keep an appointment. What may interact with this medicine? -certain antibiotics given by injection -NSAIDs, medicines for pain and inflammation, like ibuprofen or naproxen -some diuretics like bumetanide, furosemide -teriparatide -thalidomide This list may not describe all possible interactions. Give your health care provider a list of all the medicines, herbs, non-prescription drugs, or dietary supplements you use. Also tell them if you smoke, drink alcohol, or use illegal drugs. Some items may interact with your medicine. What should I watch for while using this medicine? Visit  your doctor or health care professional for regular checkups. It may be some time before you see the benefit from this medicine. Do not stop taking your medicine unless your doctor tells you to. Your doctor may order blood tests or other tests to see how you are doing. Women should inform their doctor if they wish to become pregnant or think they might be pregnant. There is a potential for serious side effects to an unborn child. Talk to your health care professional or pharmacist for more information. You should make sure that you get enough calcium and vitamin D while you are taking this medicine. Discuss the foods you eat and the vitamins you take with your health care professional. Some people who take this medicine have severe bone, joint, and/or muscle pain. This medicine may also increase your risk for a broken thigh bone. Tell your doctor right away if you have pain in your upper leg or groin. Tell your doctor if you have any pain that does not go away or that gets worse. What side effects may I notice from receiving this medicine? Side effects that you should report to your doctor or health care professional as soon as possible: -allergic reactions like skin rash, itching or hives, swelling of the face, lips, or tongue -anxiety, confusion, or depression -breathing problems -changes in vision -feeling faint or lightheaded, falls -jaw burning, cramping, pain -muscle cramps, stiffness, or weakness -trouble passing urine or change in the amount of urine Side effects that usually do not require medical attention (report to your doctor or health care professional if they continue or are bothersome): -bone, joint, or muscle pain -  fever -hair loss -irritation at site where injected -loss of appetite -nausea, vomiting -stomach upset -tired This list may not describe all possible side effects. Call your doctor for medical advice about side effects. You may report side effects to FDA at  1-800-FDA-1088. Where should I keep my medicine? This drug is given in a hospital or clinic and will not be stored at home. NOTE: This sheet is a summary. It may not cover all possible information. If you have questions about this medicine, talk to your doctor, pharmacist, or health care provider.  2013, [employer]. (09/06/2010 9:06:58 AM)  

## 2012-06-30 ENCOUNTER — Telehealth: Payer: Self-pay | Admitting: Pulmonary Disease

## 2012-06-30 ENCOUNTER — Telehealth: Payer: Self-pay | Admitting: *Deleted

## 2012-06-30 DIAGNOSIS — R0902 Hypoxemia: Secondary | ICD-10-CM

## 2012-06-30 NOTE — Telephone Encounter (Signed)
Message copied by Carola Rhine A on Wed Jun 30, 2012 11:55 AM ------      Message from: Reece Packer      Created: Tue Jun 29, 2012  5:03 PM       Labs seen and need follow up: please let her know the xray shows arthritis in the knee. She should be able to call Dr Hewitt's office to set up appointment with him, since he knows her well.       ------

## 2012-06-30 NOTE — Telephone Encounter (Signed)
She should come in for home oxygen assessment.

## 2012-06-30 NOTE — Telephone Encounter (Signed)
Pt notified of xray results below. She will get in touch with Dr Victorino Dike.

## 2012-06-30 NOTE — Telephone Encounter (Signed)
Per staff message and POF I have scheduled appts.  JMW  

## 2012-06-30 NOTE — Telephone Encounter (Signed)
i spoke with pt and she stated she has tried using the oxygen. She states she can't tell it is making a difference at all. She wants to d/c the O2. She states she has stopped using it. Please advise Dr. Craige Cotta thanks

## 2012-07-01 NOTE — Telephone Encounter (Signed)
I spoke with pt and she stated she spoke with Dr. Darrold Span (pt oncologists) and she stated she told her if it wasn't helping her then she was fine w/ pt stopping the oxygen. Pt does not want to schedule an OV to come in. Please advise Dr. Craige Cotta thanks

## 2012-07-01 NOTE — Telephone Encounter (Signed)
[email or web address].Jennifer Castillo, CMA  

## 2012-07-01 NOTE — Telephone Encounter (Signed)
Pt returned triage's call & can be reached at [phone].  Kelli [name]

## 2012-07-02 NOTE — Telephone Encounter (Signed)
ATC patient, no answer [id number] 

## 2012-07-02 NOTE — Telephone Encounter (Signed)
Order has been sent and pt is aware. Nothing further was needed

## 2012-07-02 NOTE — Telephone Encounter (Signed)
Patient returning call.

## 2012-07-02 NOTE — Telephone Encounter (Signed)
Please send order to have home oxygen set up discontinued.  Patient can call office to schedule follow up at her discretion if she needs further assistance from pulmonary service.

## 2012-07-05 ENCOUNTER — Telehealth: Payer: Self-pay

## 2012-07-05 NOTE — Telephone Encounter (Signed)
Told Ms. Giampietro the ca-2729 results and plan as noted below by Dr. Darrold Span.

## 2012-07-05 NOTE — Telephone Encounter (Signed)
Message copied by Lorine Bears on Mon Jul 05, 2012  5:26 PM ------      Message from: Reece Packer      Created: Sat Jul 03, 2012  7:13 PM       Labs seen and need follow up: please let her know that the cancer marker was a bit higher at 448, had been 299 in March and 224 in Dec. Since she is feeling well, I am ok repeating the labs next month as planned. If this continues to rise, we may need to change the letrozole treatment. ------

## 2012-07-06 ENCOUNTER — Other Ambulatory Visit: Payer: Self-pay | Admitting: *Deleted

## 2012-07-06 ENCOUNTER — Telehealth: Payer: Self-pay | Admitting: *Deleted

## 2012-07-06 NOTE — Telephone Encounter (Signed)
Message copied by Phillis Knack on Tue Jul 06, 2012  4:33 PM ------      Message from: Reece Packer      Created: Tue Jul 06, 2012  4:15 PM       Re patient upset about CA 2729 and wants to see Dr Darrold Span.        I just saw her last week and I know that she looked good by my exam; I am to see her again on May 7, which is just right for follow up. It would be fine for her to come for labs the day prior to visit, May 6, so that we have results at time of visit (either peripheral draw or from [facility], whichever she prefers); please move labs to day prior to visit if she will.  I'm sorry she is upset from the labs, but this is all a correct plan now. She is welcome to let RN know any particular questions or concerns and we am glad to be in touch with her by phone if so. ------

## 2012-07-06 NOTE — Telephone Encounter (Signed)
Called Kelli [name] back regarding labs/follow up appointment. He is OK changing labs to 5/6 and will see Dr Darrold Span 5/7. Is concerned about increase in lab CA 2729

## 2012-07-06 NOTE — Telephone Encounter (Signed)
Pt's husband left a voice mail that she is upset about the lab (ca 2729)  results and would like to see Dr Darrold Span.

## 2012-07-07 ENCOUNTER — Telehealth: Payer: Self-pay | Admitting: Oncology

## 2012-07-07 NOTE — Telephone Encounter (Signed)
lmonvm advising the pt of her lab appt that has been r/s from 07/28/2012 to 07/27/2012 due to the md ords.

## 2012-07-08 ENCOUNTER — Other Ambulatory Visit: Payer: Self-pay | Admitting: Urology

## 2012-07-12 ENCOUNTER — Encounter: Payer: Self-pay | Admitting: *Deleted

## 2012-07-16 ENCOUNTER — Other Ambulatory Visit: Payer: Self-pay | Admitting: *Deleted

## 2012-07-16 DIAGNOSIS — C50912 Malignant neoplasm of unspecified site of left female breast: Secondary | ICD-10-CM

## 2012-07-16 MED ORDER — HYDROCODONE-ACETAMINOPHEN 5-325 MG PO TABS: ORAL_TABLET | ORAL | Status: DC

## 2012-07-20 ENCOUNTER — Encounter (HOSPITAL_BASED_OUTPATIENT_CLINIC_OR_DEPARTMENT_OTHER): Payer: Self-pay | Admitting: *Deleted

## 2012-07-20 NOTE — Progress Notes (Signed)
NPO AFTER MN. ARRIVES AT 0715. NEEDS ISTAT. WILL TAKE SYNTHROID AM OF SURG W/ SIP OF WATER.

## 2012-07-21 ENCOUNTER — Other Ambulatory Visit: Payer: Self-pay | Admitting: *Deleted

## 2012-07-21 DIAGNOSIS — C50912 Malignant neoplasm of unspecified site of left female breast: Secondary | ICD-10-CM

## 2012-07-21 MED ORDER — LORAZEPAM 0.5 MG PO TABS: 0.5000 mg | ORAL_TABLET | Freq: Three times a day (TID) | ORAL | Status: DC | PRN

## 2012-07-21 NOTE — H&P (Signed)
H&P  Chief Complaint: Left kidney blockage  History of Present Illness: Kelli [name] is a 72 y.o. year old female who presents for cystoscopy and left double-J stent exchange. Her history is outline below:  She was originally sent by Dr. Darrold Span in late July 2013 for further management of left hydronephrosis. The patient's history of breast cancer dates back to approximately 2010. She was treated then for nodal positive disease. In early July 2013, she was found to have a left femoral fracture, which was fixed by Dr. Earlie Counts. She was found to have multiple sites of metastatic disease within the abdomen, as well as her pathologic fracture. She was found to have left hydronephrosis secondary to retroperitoneal recurrence as well. She has had a PET scan performed. She underwent cysto and left double J stent placement on 10/30/2011.  Interval history:  She came in in [facility] April 2014 for followup ultrasound. She is having some left lower quadrant discomfort. She has had no gross hematuria. She was recently told that she her markers for breast cancer were elevated, and she may need further adjuvant therapy. Ultrasound revealed recurrent left hydronephrosis.  Past Medical History  Diagnosis Date  . Thyroid disease IN 1982    HX IODINE-131 ABLATION FOR HYPERTHYROIDISM  . Osteoporosis   . Vitamin D insufficiency   . Chronic fatigue   . Depression   . Anxiety   . Insomnia   . History of chemotherapy     Adriamycin/cytoxan followed by [facility]  08/2000/tamoxifen 06/2001-02/2003,then arimidex 02/2003-08/2009  . 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 History of radiation therapy 12/28/00-02/12/01    left breast  With Dr. Jamie Kato  . Hypothyroidism   . Fatigue   . Short of breath on exertion   . Breast cancer, left JUNE OF 2002    LEFT BREAST, ER/PR +, HER 2 -  . 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 Metastatic cancer to liver 09/24/11    CURRENTLY RECEIVING  RADIATION  . Radiation 10/15/11-10/28/2011    left proximal leg  . Hydronephrosis, left     SECONDARY TO RETROPERITONEAL METS    Past Surgical History  Procedure Laterality Date  . Cholecystectomy  1970  . Femur im nail  09/24/2011     Procedure: INTRAMEDULLARY (IM) NAIL FEMORAL;  Surgeon: Toni Arthurs, MD;  Location: [facility];  Service: Orthopedics;  Laterality: Left;  FEMURAL NECK RX/ INTERTROCHANTERIC PATHOLOGIC FX  . Bone biopsy  09/25/2011    left medullary canal femur reaming=Metastatic Carcinoma  . Hysteroscopy w/d&c  01-11-2003    REMOVAL POLYP  . Left partial mastectomy / sentinel lymph node bx  08-28-2000    LEFT BREAST CANCER  . Cysto/ left retrograde pyleogram/  left ureteral stent placement  10-30-2011    Home Medications:  No prescriptions prior to admission    Allergies:  Allergies  Allergen Reactions  . Contrast Media (Iodinated Diagnostic Agents) Rash    GIVE BENADRYL PRIOR  . Other Nausea And Vomiting and Rash    "MYCIN" DRUGS  . Sulfa Antibiotics Nausea And Vomiting and Rash    No family history on file.  Social History:  reports that she has never smoked. She has never used smokeless tobacco. She reports that she does not drink alcohol or use illicit drugs.  ROS: A complete review of systems was performed.  All systems are negative except for pertinent findings as noted.  Physical Exam:  Vital signs in last 24  hours:   General:  Alert and oriented, No acute distress HEENT: Normocephalic, atraumatic Neck: No JVD or lymphadenopathy Cardiovascular: Regular rate and rhythm Lungs: Clear bilaterally Abdomen: Soft, nontender, nondistended, no abdominal masses Back: No CVA tenderness Extremities: No edema Neurologic: Grossly intact  Laboratory Data:  No results found for this or any previous visit (from the past 24 hour(s)). No results found for this or any previous visit (from the past 240 hour(s)). Creatinine: No results found for this basename:  CREATININE,  in the last 168 hours  Radiologic Imaging: No results found.  Impression/Assessment:  Malignant left hydronephrosis  Plan:  Cystoscopy, left double J stent exchange  Chelsea Aus 07/21/2012, 8:34 PM  Bertram Millard. Yamilka Lopiccolo MD

## 2012-07-22 ENCOUNTER — Encounter (HOSPITAL_BASED_OUTPATIENT_CLINIC_OR_DEPARTMENT_OTHER)
Admission: RE | Disposition: A | Discharge status: Home / Self Care | Payer: Self-pay | Source: Ambulatory Visit | Attending: Urology

## 2012-07-22 ENCOUNTER — Ambulatory Visit (HOSPITAL_BASED_OUTPATIENT_CLINIC_OR_DEPARTMENT_OTHER)
Admission: RE | Admit: 2012-07-22 | Discharge: 2012-07-22 | Disposition: A | Discharge status: Home / Self Care | Payer: Medicare Other | Source: Ambulatory Visit | Attending: Urology | Admitting: Urology

## 2012-07-22 ENCOUNTER — Ambulatory Visit (HOSPITAL_BASED_OUTPATIENT_CLINIC_OR_DEPARTMENT_OTHER): Payer: Medicare Other | Admitting: Anesthesiology

## 2012-07-22 ENCOUNTER — Encounter (HOSPITAL_BASED_OUTPATIENT_CLINIC_OR_DEPARTMENT_OTHER): Payer: Self-pay | Admitting: Anesthesiology

## 2012-07-22 ENCOUNTER — Encounter (HOSPITAL_BASED_OUTPATIENT_CLINIC_OR_DEPARTMENT_OTHER): Payer: Self-pay | Admitting: *Deleted

## 2012-07-22 DIAGNOSIS — M810 Age-related osteoporosis without current pathological fracture: Secondary | ICD-10-CM | POA: Insufficient documentation

## 2012-07-22 DIAGNOSIS — N362 Urethral caruncle: Secondary | ICD-10-CM | POA: Insufficient documentation

## 2012-07-22 DIAGNOSIS — C7951 Secondary malignant neoplasm of bone: Secondary | ICD-10-CM | POA: Insufficient documentation

## 2012-07-22 DIAGNOSIS — Z853 Personal history of malignant neoplasm of breast: Secondary | ICD-10-CM | POA: Insufficient documentation

## 2012-07-22 DIAGNOSIS — Z888 Allergy status to other drugs, medicaments and biological substances status: Secondary | ICD-10-CM | POA: Insufficient documentation

## 2012-07-22 DIAGNOSIS — Z91041 Radiographic dye allergy status: Secondary | ICD-10-CM | POA: Insufficient documentation

## 2012-07-22 DIAGNOSIS — G4730 Sleep apnea, unspecified: Secondary | ICD-10-CM | POA: Insufficient documentation

## 2012-07-22 DIAGNOSIS — C786 Secondary malignant neoplasm of retroperitoneum and peritoneum: Secondary | ICD-10-CM | POA: Insufficient documentation

## 2012-07-22 DIAGNOSIS — Z9221 Personal history of antineoplastic chemotherapy: Secondary | ICD-10-CM | POA: Insufficient documentation

## 2012-07-22 DIAGNOSIS — E039 Hypothyroidism, unspecified: Secondary | ICD-10-CM | POA: Insufficient documentation

## 2012-07-22 DIAGNOSIS — C787 Secondary malignant neoplasm of liver and intrahepatic bile duct: Secondary | ICD-10-CM | POA: Insufficient documentation

## 2012-07-22 DIAGNOSIS — Z882 Allergy status to sulfonamides status: Secondary | ICD-10-CM | POA: Insufficient documentation

## 2012-07-22 DIAGNOSIS — C779 Secondary and unspecified malignant neoplasm of lymph node, unspecified: Secondary | ICD-10-CM | POA: Insufficient documentation

## 2012-07-22 DIAGNOSIS — N1330 Unspecified hydronephrosis: Secondary | ICD-10-CM | POA: Insufficient documentation

## 2012-07-22 DIAGNOSIS — Z923 Personal history of irradiation: Secondary | ICD-10-CM | POA: Insufficient documentation

## 2012-07-22 HISTORY — DX: Unspecified hydronephrosis: N13.30

## 2012-07-22 HISTORY — PX: CYSTOSCOPY W/ URETERAL STENT PLACEMENT: SHX1429

## 2012-07-22 LAB — POCT I-STAT 4, (NA,K, GLUC, HGB,HCT)
Glucose, Bld: 125 mg/dL — ABNORMAL HIGH (ref 70–99)
Hemoglobin: 10.9 g/dL — ABNORMAL LOW (ref 12.0–15.0)
Potassium: 3.9 mEq/L (ref 3.5–5.1)

## 2012-07-22 SURGERY — CYSTOSCOPY, FLEXIBLE, WITH STENT REPLACEMENT
Anesthesia: General | Site: Ureter | Laterality: Left | Wound class: Clean Contaminated

## 2012-07-22 MED ORDER — ACETAMINOPHEN 650 MG RE SUPP
650.0000 mg | RECTAL | Status: DC | PRN
  Filled 2012-07-22: qty 1

## 2012-07-22 MED ORDER — EPHEDRINE SULFATE 50 MG/ML IJ SOLN
INTRAMUSCULAR | Status: DC | PRN
  Administered 2012-07-22: 10 mg via INTRAVENOUS

## 2012-07-22 MED ORDER — ONDANSETRON HCL 4 MG/2ML IJ SOLN
4.0000 mg | Freq: Four times a day (QID) | INTRAMUSCULAR | Status: DC | PRN
  Filled 2012-07-22: qty 2

## 2012-07-22 MED ORDER — PROMETHAZINE HCL 25 MG/ML IJ SOLN
6.2500 mg | INTRAMUSCULAR | Status: DC | PRN
  Filled 2012-07-22: qty 1

## 2012-07-22 MED ORDER — FENTANYL CITRATE 0.05 MG/ML IJ SOLN
INTRAMUSCULAR | Status: DC | PRN
  Administered 2012-07-22: 25 ug via INTRAVENOUS
  Administered 2012-07-22: 50 ug via INTRAVENOUS

## 2012-07-22 MED ORDER — LACTATED RINGERS IV SOLN
INTRAVENOUS | Status: DC
  Administered 2012-07-22: 08:00:00 via INTRAVENOUS
  Administered 2012-07-22: 100 mL/h via INTRAVENOUS
  Filled 2012-07-22: qty 1000

## 2012-07-22 MED ORDER — SODIUM CHLORIDE 0.9 % IJ SOLN
3.0000 mL | Freq: Two times a day (BID) | INTRAMUSCULAR | Status: DC
  Filled 2012-07-22: qty 3

## 2012-07-22 MED ORDER — LIDOCAINE HCL (CARDIAC) 20 MG/ML IV SOLN
INTRAVENOUS | Status: DC | PRN
  Administered 2012-07-22: 75 mg via INTRAVENOUS

## 2012-07-22 MED ORDER — SODIUM CHLORIDE 0.9 % IJ SOLN
3.0000 mL | INTRAMUSCULAR | Status: DC | PRN
  Filled 2012-07-22: qty 3

## 2012-07-22 MED ORDER — PROPOFOL 10 MG/ML IV BOLUS
INTRAVENOUS | Status: DC | PRN
  Administered 2012-07-22: 150 mg via INTRAVENOUS
  Administered 2012-07-22: 50 mg via INTRAVENOUS

## 2012-07-22 MED ORDER — DEXAMETHASONE SODIUM PHOSPHATE 4 MG/ML IJ SOLN
INTRAMUSCULAR | Status: DC | PRN
  Administered 2012-07-22: 8 mg via INTRAVENOUS

## 2012-07-22 MED ORDER — ONDANSETRON HCL 4 MG/2ML IJ SOLN
INTRAMUSCULAR | Status: DC | PRN
  Administered 2012-07-22: 4 mg via INTRAVENOUS

## 2012-07-22 MED ORDER — FENTANYL CITRATE 0.05 MG/ML IJ SOLN
25.0000 ug | INTRAMUSCULAR | Status: DC | PRN
  Filled 2012-07-22: qty 1

## 2012-07-22 MED ORDER — SODIUM CHLORIDE 0.9 % IV SOLN
250.0000 mL | INTRAVENOUS | Status: DC | PRN
  Filled 2012-07-22: qty 250

## 2012-07-22 MED ORDER — STERILE WATER FOR IRRIGATION IR SOLN
Status: DC | PRN
  Administered 2012-07-22: 3000 mL via INTRAVESICAL

## 2012-07-22 MED ORDER — OXYCODONE HCL 5 MG PO TABS
5.0000 mg | ORAL_TABLET | ORAL | Status: DC | PRN
  Filled 2012-07-22: qty 2

## 2012-07-22 MED ORDER — CEFAZOLIN SODIUM 1-5 GM-% IV SOLN
1.0000 g | INTRAVENOUS | Status: DC
  Filled 2012-07-22: qty 50

## 2012-07-22 MED ORDER — MORPHINE SULFATE 2 MG/ML IJ SOLN
1.0000 mg | INTRAMUSCULAR | Status: DC | PRN
  Filled 2012-07-22: qty 1

## 2012-07-22 MED ORDER — CEPHALEXIN 500 MG PO CAPS: 500.0000 mg | ORAL_CAPSULE | Freq: Two times a day (BID) | ORAL | Status: DC

## 2012-07-22 MED ORDER — ACETAMINOPHEN 325 MG PO TABS
650.0000 mg | ORAL_TABLET | ORAL | Status: DC | PRN
  Filled 2012-07-22: qty 2

## 2012-07-22 SURGICAL SUPPLY — 19 items
ADAPTER CATH URET PLST 4-6FR (CATHETERS) IMPLANT
ADPR CATH URET STRL DISP 4-6FR (CATHETERS)
BAG DRAIN URO-CYSTO SKYTR STRL (DRAIN) ×2 IMPLANT
BAG DRN UROCATH (DRAIN) ×1
CANISTER SUCT LVC 12 LTR MEDI- (MISCELLANEOUS) IMPLANT
CATH INTERMIT  6FR 70CM (CATHETERS) IMPLANT
CLOTH BEACON ORANGE TIMEOUT ST (SAFETY) ×2 IMPLANT
DRAPE CAMERA CLOSED 9X96 (DRAPES) ×2 IMPLANT
GLOVE BIO SURGEON STRL SZ8 (GLOVE) ×2 IMPLANT
GLOVE BIOGEL M 7.0 STRL (GLOVE) ×2 IMPLANT
GLOVE INDICATOR 7.0 STRL GRN (GLOVE) ×2 IMPLANT
GOWN PREVENTION PLUS LG XLONG (DISPOSABLE) ×2 IMPLANT
GOWN STRL REIN XL XLG (GOWN DISPOSABLE) ×2 IMPLANT
GUIDEWIRE 0.038 PTFE COATED (WIRE) IMPLANT
GUIDEWIRE ANG ZIPWIRE 038X150 (WIRE) IMPLANT
GUIDEWIRE STR DUAL SENSOR (WIRE) IMPLANT
NS IRRIG 500ML POUR BTL (IV SOLUTION) IMPLANT
PACK CYSTOSCOPY (CUSTOM PROCEDURE TRAY) ×2 IMPLANT
STENT CONTOUR 7FRX24 (STENTS) ×2 IMPLANT

## 2012-07-22 NOTE — Anesthesia Postprocedure Evaluation (Signed)
  Anesthesia Post-op Note  Patient: Kelli [name]  Procedure(s) Performed: Procedure(s) (LRB): CYSTOSCOPY WITH STENT REPLACEMENT (Left)  Patient Location: PACU  Anesthesia Type: General  Level of Consciousness: awake and alert   Airway and Oxygen Therapy: Patient Spontanous Breathing  Post-op Pain: mild  Post-op Assessment: Post-op Vital signs reviewed, Patient's Cardiovascular Status Stable, Respiratory Function Stable, Patent Airway and No signs of Nausea or vomiting  Last Vitals:  Filed Vitals:   07/22/12 1009  BP:   Pulse: 83  Temp:   Resp: 19    Post-op Vital Signs: stable   Complications: No apparent anesthesia complications

## 2012-07-22 NOTE — Transfer of Care (Signed)
Immediate Anesthesia Transfer of Care Note  Immediate Anesthesia Transfer of Care Note  Patient: Kelli [name]  Procedure(s) Performed: Procedure(s) (LRB): CYSTOSCOPY WITH STENT REPLACEMENT (Left)  Patient Location: Patient transported to PACU with oxygen via face mask at 4 Liters / Min  Anesthesia Type: General  Level of Consciousness: awake and alert   Airway & Oxygen Therapy: Patient Spontanous Breathing and Patient connected to face mask oxygen  Post-op Assessment: Report given to PACU RN and Post -op Vital signs reviewed and stable  Post vital signs: Reviewed and stable  Dentition: Teeth and oropharynx remain in pre-op condition  Complications: No apparent anesthesia complications

## 2012-07-22 NOTE — Op Note (Signed)
Preoperative diagnosis: Malignant hydronephrosis of left kidney  Postoperative diagnosis: Same   Procedure: Cystoscopy, exchange of left double-J stent-with placement of new 7 French by 24 cm contour stent without tether    Surgeon: Bertram Millard. Caleyah Jr, M.D.   Anesthesia: Gen.   Complications: None  Specimen(s): None  Drain(s): 24 cm x 7 French contour stent  Indications: 72 year-old female with metastatic breast cancer, with an indwelling left double-J stent which was placed approximately 8 months ago. Recent followup renal ultrasound revealed recurrent left hydronephrosis. She presents at this time for double-J stent change   Technique and findings: the patient was properly identified in the holding area, she was marked appropriately, and taken to the operating room where general anesthetic was administered with the LMA. She had received preoperative IV antibiotics. She was placed in the dorsolithotomy position, genitalia and perineum were prepped and draped. Proper timeout was then performed.  I noticed prior to the cystoscopy that there was slight bleeding from the urethral meatus, most likely from a caruncle. I passed, quite easily, the 22 [place] panendoscope. Systematic inspection of the bladder revealed normal findings without mucosal/urothelial abnormalities. The left ureteral orifice was normal with the double-J stent protruding through it with a nice curl but there were encrustations on the stent. This was grasped and brought out through the urethral meatus. I then threaded a sensor-tip guidewire through the stent, and using fluoroscopic guidance advanced the guidewire into the left renal pelvis where good curl was seen. Over top of the guidewire, after the old stent was removed, I placed a new 7 [place] by 24 cm contour stent. After the stent was pushed up into the kidney, with the guidewire removed good curls were seen proximally and distally using fluoroscopic and cystoscopic  guidance. The bladder was drained. The scope was removed. I placed a 4 x 4 gauze over the urethral meatus, as there was mild bleeding from the caruncle. The patient tolerated procedure well. She was awakened and taken to the PACU in stable condition.  I spoke with the patient's husband following the procedure. He was wondering if the abdominal pain would improve following stent placement. I told him that if the hydronephrosis was causing the pain, this should relieve the pain. However, the patient may have an extrarenal source for her abdominal pain. This will be followed up in the near future by Dr. Darrold Span.

## 2012-07-22 NOTE — Anesthesia Procedure Notes (Signed)
Procedure Name: LMA Insertion Date/Time: 07/22/2012 8:44 AM Performed by: Fran Lowes Pre-anesthesia Checklist: Patient identified, Emergency Drugs available, Suction available and Patient being monitored Patient Re-evaluated:Patient Re-evaluated prior to inductionOxygen Delivery Method: Circle System Utilized Preoxygenation: Pre-oxygenation with 100% oxygen Intubation Type: IV induction Ventilation: Mask ventilation without difficulty LMA: LMA inserted LMA Size: 4.0 Number of attempts: 1 Airway Equipment and Method: bite block Placement Confirmation: positive ETCO2 Tube secured with: Tape Dental Injury: Teeth and Oropharynx as per pre-operative assessment

## 2012-07-22 NOTE — Anesthesia Preprocedure Evaluation (Addendum)
Anesthesia Evaluation  Patient identified by MRN, date of birth, ID band Patient awake  General Assessment Comment: Thyroid disease  IN 1982       HX IODINE-131 ABLATION FOR HYPERTHYROIDISM   .  Osteoporosis     .  Vitamin D insufficiency     .  Chronic fatigue     .  Depression     .  Anxiety     .  Insomnia     .  History of chemotherapy         Adriamycin/cytoxan followed by [facility]  08/2000/tamoxifen 06/2001-02/2003,then arimidex 02/2003-08/2009   .  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  History of radiation therapy  12/28/00-02/12/01       left breast  With Dr. Jamie Kato   .  Hypothyroidism     .  Fatigue     .  Short of breath on exertion     .  Breast cancer, left  JUNE OF 2002       LEFT BREAST, ER/PR +, HER 2 -   .  Metastasis to bone  09/24/11       left femur=bone Metastatic Carcinoma   .  Metastatic cancer to liver  09/24/11       CURRENTLY RECEIVING RADIATION   .  Radiation  10/15/11-10/28/2011       left proximal leg   .  Hydronephrosis, left         SECONDARY TO RETROPERITONEAL METS          Reviewed: Allergy & Precautions, H&P , NPO status , Patient's Chart, lab work & pertinent test results  Airway Mallampati: II TM Distance: >3 FB Neck ROM: Full    Dental no notable dental hx.    Pulmonary shortness of breath, with exertion and Long-Term Oxygen Therapy,  Off oxygen for a month now. She believes that her previous SOB was secondary to a research drug she was on, that has since been discontinued. breath sounds clear to auscultation  Pulmonary exam normal       Cardiovascular negative cardio ROS  Rhythm:Regular Rate:Normal  Most recent ECG and CXR reviewed.   Neuro/Psych PSYCHIATRIC DISORDERS Anxiety Depression negative neurological ROS     GI/Hepatic negative GI ROS, Neg liver ROS,   Endo/Other  Hypothyroidism   Renal/GU Renal disease  negative genitourinary   Musculoskeletal negative  musculoskeletal ROS (+)   Abdominal   Peds negative pediatric ROS (+)  Hematology negative hematology ROS (+)   Anesthesia Other Findings   Reproductive/Obstetrics negative OB ROS                          Anesthesia Physical Anesthesia Plan  ASA: III  Anesthesia Plan: General   Post-op Pain Management:    Induction: Intravenous  Airway Management Planned: LMA  Additional Equipment:   Intra-op Plan:   Post-operative Plan: Extubation in OR  Informed Consent: I have reviewed the patients History and Physical, chart, labs and discussed the procedure including the risks, benefits and alternatives for the proposed anesthesia with the patient or authorized representative who has indicated his/her understanding and acceptance.   Dental advisory given  Plan Discussed with: CRNA  Anesthesia Plan Comments: (Elevated liver enzymes. Avoid IV tylenol. Did fine in August 2013 for similar procedure.)       Anesthesia Quick Evaluation

## 2012-07-23 ENCOUNTER — Encounter (HOSPITAL_BASED_OUTPATIENT_CLINIC_OR_DEPARTMENT_OTHER): Payer: Self-pay | Admitting: Urology

## 2012-07-27 ENCOUNTER — Other Ambulatory Visit: Payer: Self-pay | Admitting: *Deleted

## 2012-07-27 ENCOUNTER — Encounter: Payer: Self-pay | Admitting: *Deleted

## 2012-07-27 ENCOUNTER — Other Ambulatory Visit: Payer: Self-pay | Admitting: Oncology

## 2012-07-27 ENCOUNTER — Other Ambulatory Visit (HOSPITAL_BASED_OUTPATIENT_CLINIC_OR_DEPARTMENT_OTHER): Payer: Medicare Other | Admitting: Lab

## 2012-07-27 DIAGNOSIS — C50419 Malignant neoplasm of upper-outer quadrant of unspecified female breast: Secondary | ICD-10-CM

## 2012-07-27 DIAGNOSIS — C7951 Secondary malignant neoplasm of bone: Secondary | ICD-10-CM

## 2012-07-27 DIAGNOSIS — C7952 Secondary malignant neoplasm of bone marrow: Secondary | ICD-10-CM

## 2012-07-27 LAB — CBC WITH DIFFERENTIAL/PLATELET
BASO%: 0.6 % (ref 0.0–2.0)
Basophils Absolute: 0 10*3/uL (ref 0.0–0.1)
EOS%: 1.7 % (ref 0.0–7.0)
HGB: 10.5 g/dL — ABNORMAL LOW (ref 11.6–15.9)
MCH: 34.6 pg — ABNORMAL HIGH (ref 25.1–34.0)
MCHC: 34.3 g/dL (ref 31.5–36.0)
MCV: 101 fL (ref 79.5–101.0)
MONO%: 9.1 % (ref 0.0–14.0)
RBC: 3.02 10*6/uL — ABNORMAL LOW (ref 3.70–5.45)
RDW: 16.6 % — ABNORMAL HIGH (ref 11.2–14.5)
lymph#: 3.3 10*3/uL (ref 0.9–3.3)

## 2012-07-27 LAB — COMPREHENSIVE METABOLIC PANEL (CC13)
AST: 55 U/L — ABNORMAL HIGH (ref 5–34)
Albumin: 2.9 g/dL — ABNORMAL LOW (ref 3.5–5.0)
Alkaline Phosphatase: 114 U/L (ref 40–150)
Glucose: 136 mg/dl — ABNORMAL HIGH (ref 70–99)
Potassium: 3.7 mEq/L (ref 3.5–5.1)
Sodium: 140 mEq/L (ref 136–145)
Total Bilirubin: 1.38 mg/dL — ABNORMAL HIGH (ref 0.20–1.20)
Total Protein: 6.2 g/dL — ABNORMAL LOW (ref 6.4–8.3)

## 2012-07-27 MED ORDER — PANTOPRAZOLE SODIUM 40 MG PO TBEC: 40.0000 mg | DELAYED_RELEASE_TABLET | Freq: Every day | ORAL | Status: DC

## 2012-07-27 MED ORDER — TEMAZEPAM 15 MG PO CAPS: 15.0000 mg | ORAL_CAPSULE | Freq: Every evening | ORAL | Status: DC | PRN

## 2012-07-27 NOTE — Progress Notes (Signed)
07/27/12 at 11:32am  Bolero 4 survival follow-up information - The pt was into the cancer center this am for her lab work before her scheduled MD appointment tomorrow.  The pt confirmed that she is indeed still taking her off-study letrozole daily.  She said that her CA 27.29 has been increasing, and Dr. Darrold Span will decide tomorrow if she needs to begin a new antineoplastic treatment.  The pt stated she had an elective stent replacement last week.  She said that Dr. Darrold Span wanted this stent replaced before she begins any new treatment.   The research nurse gave the pt and her husband some emotional support.    07/28/12 at 4:05pm - The pt was in the cancer center today for her MD appointment to discuss her increasing CA 27.29.  Dr. Darrold Span stopped the pt's letrozole today.  Dr. Darrold Span advised the pt to get a port placement for her weekly taxol.  The pt is reluctant to proceed with this new treatment.  The research nurse encouraged the pt to "take it one day at a time".  The research nurse explained that a port is a wonderful device and will make her treatments much easier.  The pt stated that she took her last off-study letrozole yesterday, 07/27/12.  The pt will start her weekly taxol after her port is placed in the next few days.  The research nurse will continue to monitor the pt's status and will continue to offer the pt emotional support.

## 2012-07-28 ENCOUNTER — Telehealth: Payer: Self-pay | Admitting: Oncology

## 2012-07-28 ENCOUNTER — Ambulatory Visit (HOSPITAL_BASED_OUTPATIENT_CLINIC_OR_DEPARTMENT_OTHER): Payer: Medicare Other | Admitting: Oncology

## 2012-07-28 ENCOUNTER — Ambulatory Visit (HOSPITAL_BASED_OUTPATIENT_CLINIC_OR_DEPARTMENT_OTHER): Payer: Medicare Other

## 2012-07-28 ENCOUNTER — Encounter: Payer: Self-pay | Admitting: Oncology

## 2012-07-28 ENCOUNTER — Other Ambulatory Visit: Payer: Medicare Other | Admitting: Lab

## 2012-07-28 VITALS — BP 129/71 | HR 106 | Temp 97.8°F | Resp 17 | Ht 64.0 in | Wt 134.0 lb

## 2012-07-28 DIAGNOSIS — C7951 Secondary malignant neoplasm of bone: Secondary | ICD-10-CM

## 2012-07-28 DIAGNOSIS — D649 Anemia, unspecified: Secondary | ICD-10-CM

## 2012-07-28 DIAGNOSIS — C50919 Malignant neoplasm of unspecified site of unspecified female breast: Secondary | ICD-10-CM

## 2012-07-28 DIAGNOSIS — C50419 Malignant neoplasm of upper-outer quadrant of unspecified female breast: Secondary | ICD-10-CM

## 2012-07-28 DIAGNOSIS — C787 Secondary malignant neoplasm of liver and intrahepatic bile duct: Secondary | ICD-10-CM

## 2012-07-28 LAB — CANCER ANTIGEN 27.29: CA 27.29: 639 U/mL — ABNORMAL HIGH (ref 0–39)

## 2012-07-28 MED ORDER — ZOLEDRONIC ACID 4 MG/5ML IV CONC
4.0000 mg | Freq: Once | INTRAVENOUS | Status: AC
  Administered 2012-07-28: 4 mg via INTRAVENOUS
  Filled 2012-07-28: qty 5

## 2012-07-28 NOTE — Patient Instructions (Signed)
Zoledronic Acid injection (Hypercalcemia, Oncology) What is this medicine? ZOLEDRONIC ACID (ZOE le dron ik AS id) lowers the amount of calcium loss from bone. It is used to treat too much calcium in your blood from cancer. It is also used to prevent complications of cancer that has spread to the bone. This medicine may be used for other purposes; ask your health care provider or pharmacist if you have questions. What should I tell my health care provider before I take this medicine? They need to know if you have any of these conditions: -aspirin-sensitive asthma -dental disease -kidney disease -an unusual or allergic reaction to zoledronic acid, other medicines, foods, dyes, or preservatives -pregnant or trying to get pregnant -breast-feeding How should I use this medicine? This medicine is for infusion into a vein. It is given by a health care professional in a hospital or clinic setting. Talk to your pediatrician regarding the use of this medicine in children. Special care may be needed. Overdosage: If you think you have taken too much of this medicine contact a poison control center or emergency room at once. NOTE: This medicine is only for you. Do not share this medicine with others. What if I miss a dose? It is important not to miss your dose. Call your doctor or health care professional if you are unable to keep an appointment. What may interact with this medicine? -certain antibiotics given by injection -NSAIDs, medicines for pain and inflammation, like ibuprofen or naproxen -some diuretics like bumetanide, furosemide -teriparatide -thalidomide This list may not describe all possible interactions. Give your health care provider a list of all the medicines, herbs, non-prescription drugs, or dietary supplements you use. Also tell them if you smoke, drink alcohol, or use illegal drugs. Some items may interact with your medicine. What should I watch for while using this medicine? Visit  your doctor or health care professional for regular checkups. It may be some time before you see the benefit from this medicine. Do not stop taking your medicine unless your doctor tells you to. Your doctor may order blood tests or other tests to see how you are doing. Women should inform their doctor if they wish to become pregnant or think they might be pregnant. There is a potential for serious side effects to an unborn child. Talk to your health care professional or pharmacist for more information. You should make sure that you get enough calcium and vitamin D while you are taking this medicine. Discuss the foods you eat and the vitamins you take with your health care professional. Some people who take this medicine have severe bone, joint, and/or muscle pain. This medicine may also increase your risk for a broken thigh bone. Tell your doctor right away if you have pain in your upper leg or groin. Tell your doctor if you have any pain that does not go away or that gets worse. What side effects may I notice from receiving this medicine? Side effects that you should report to your doctor or health care professional as soon as possible: -allergic reactions like skin rash, itching or hives, swelling of the face, lips, or tongue -anxiety, confusion, or depression -breathing problems -changes in vision -feeling faint or lightheaded, falls -jaw burning, cramping, pain -muscle cramps, stiffness, or weakness -trouble passing urine or change in the amount of urine Side effects that usually do not require medical attention (report to your doctor or health care professional if they continue or are bothersome): -bone, joint, or muscle pain -  fever -hair loss -irritation at site where injected -loss of appetite -nausea, vomiting -stomach upset -tired This list may not describe all possible side effects. Call your doctor for medical advice about side effects. You may report side effects to FDA at  1-800-FDA-1088. Where should I keep my medicine? This drug is given in a hospital or clinic and will not be stored at home. NOTE: This sheet is a summary. It may not cover all possible information. If you have questions about this medicine, talk to your doctor, pharmacist, or health care provider.  2013, [employer]. (09/06/2010 9:06:58 AM)  

## 2012-07-28 NOTE — Telephone Encounter (Signed)
Gave pt appt for MD visit only, emailed Michelle regarding chemo, labs not ordered due to not sure when is the port will be placed, once port is put in labs will be ordered, pt aware that we are waiting for the port to be place by IR

## 2012-07-28 NOTE — Progress Notes (Signed)
OFFICE PROGRESS NOTE   07/28/2012   Physicians:J.Hewitt, J.Kinard, J.Beekman, W.Elkins, M.Altheimer, S.Dahlstedt, C.Ezzard Standing, V. Sood    INTERVAL HISTORY:   Patient is seen, together with husband, in continuing attention to her metastatic breast carcinoma involving bone and liver, receiving letrozole and zometa since she came off of Bolero 4 trial with additional everolimus. CA 2729 and other labs show progression on present regimen, and patient is noticing some symptoms. Last scans on Bolero 4 protocol were in Dec and last PET in July 2013. She does not have PAC, however peripheral IV access is difficult and limited to RUE. She is due monthly zometa today.  Oncologic History History is of T1N1 invasive ductal left breast cancer diagnosed June 2002 with 13 nodes involved including extracapsular extension, ER + 67%, PR + 39%, Her 2 0. She was treated with lumpectomy and 18 node axillary dissection, adjuvant adriamycin/cytoxan followed by [facility] chemotherapy, local radiation, tamoxifen from April 2003 thru Dec 2003 then arimidex from Dec 2004 thru June 2011. The arimidex had been continued for that duration due to high risk features, stopped as there had been no evidence of disease and with known osteoporosis. She was clinically stable and labs were normal other than very slight decrease in hemoglobin when I saw her in Jan 2013. She developed acute pain in left hip in June 2013, with MRI hip 09-20-11 showing metastatic disease with pathologic fracture of femoral neck, with nailing by Dr Victorino Dike on 09-24-11. Pathology confirmed metastatic breast carcinoma, ER+ 100%, PR negative and HER 2 negative by [facility]. She had left ureteral stent by Dr Retta Diones 11-01-11 for obstructive hydronephrosis. She received radiation 3000 cGy in 10 fractions to left femur 7-24 thru 10-28-11 by Dr Roselind Messier. Bolero 4 study was started 11-12-11. Everolimus dose was reduced due to epistaxis. Restaging scans done per protocol in Dec 2013 (bone  scan, CT chest, MRI abdomen and pelvis) all showed further improvement in metastatic disease, without other progression. Everolimus was discontinued after 03-03-12 due to dyspnea. Respiratory problems were evaluated with VQ, echocardiogram and bubble echo, and PFTs by Dr Craige Cotta, and the SOB progressively has improved. She had left ureteral stent changed by Dr Retta Diones on 07-22-2012. CA 2729 had been 354 in July 2013, down to 224 in Dec when everolimus DCd, then was 299 in March 2014, 448 in April and was up to 639 by labs drawn on 07-27-12.   More nausea recently, improved with antiemetic samples from Dr Retta Diones which are not listed in this EMR; she will let us know what medication she is using. Recent intolerance to Cephalexin (used with stent change?) such that she did not complete that course. Initial hematuria after stent change, resolved. Little SOB now. Some pain left hip, using occasional prn pain medication. Bowels ok. No fever or symptoms of infection. Remainder of 10 point Review of Systems negative.  Objective:  Vital signs in last 24 hours:  BP 129/71  Pulse 106  Temp(Src) 97.8 F (36.6 C) (Oral)  Resp 17  Ht 5\' 4"  (1.626 m)  Wt 134 lb (60.782 kg)  BMI 22.99 kg/m2  Easily mobile without assistance. Respirations not labored RA. Alert, appropriate conversation, NAD.  HEENT:PERRLA, extra ocular movement intact, sclera clear, anicteric and oropharynx clear, no lesions. No alopecia. Mucous membranes moist. LymphaticsCervical, supraclavicular, and axillary nodes normal. Resp: clear to auscultation bilaterally and normal percussion bilaterally Cardio: regular rate and rhythm GI: soft, non-tender; bowel sounds normal; no masses,  no organomegaly appreciated. Extremities: extremities normal, atraumatic, no cyanosis  or edema Neuro:no sensory deficits noted Breasts: lumpectomy on left, no dominant mass or other findings of concern bilaterally No cords or bruising RUE. Skin without rash  or ecchymosis otherwise Lab Results:  Results for orders placed in visit on 07/27/12  CBC WITH DIFFERENTIAL      Result Value Range   WBC 8.5  3.9 - 10.3 10e3/uL   NEUT# 4.2  1.5 - 6.5 10e3/uL   HGB 10.5 (*) 11.6 - 15.9 g/dL   HCT 16.1 (*) 09.6 - 04.5 %   Platelets 121 (*) 145 - 400 10e3/uL   MCV 101.0  79.5 - 101.0 fL   MCH 34.6 (*) 25.1 - 34.0 pg   MCHC 34.3  31.5 - 36.0 g/dL   RBC 4.09 (*) 8.11 - 9.14 10e6/uL   RDW 16.6 (*) 11.2 - 14.5 %   lymph# 3.3  0.9 - 3.3 10e3/uL   MONO# 0.8  0.1 - 0.9 10e3/uL   Eosinophils Absolute 0.1  0.0 - 0.5 10e3/uL   Basophils Absolute 0.0  0.0 - 0.1 10e3/uL   NEUT% 49.9  38.4 - 76.8 %   LYMPH% 38.7  14.0 - 49.7 %   MONO% 9.1  0.0 - 14.0 %   EOS% 1.7  0.0 - 7.0 %   BASO% 0.6  0.0 - 2.0 %  CANCER ANTIGEN 27.29      Result Value Range   CA 27.29 639 (*) 0 - 39 U/mL  COMPREHENSIVE METABOLIC PANEL (CC13)      Result Value Range   Sodium 140  136 - 145 mEq/L   Potassium 3.7  3.5 - 5.1 mEq/L   Chloride 105  98 - 107 mEq/L   CO2 25  22 - 29 mEq/L   Glucose 136 (*) 70 - 99 mg/dl   BUN 78.2  7.0 - 95.6 mg/dL   Creatinine 1.0  0.6 - 1.1 mg/dL   Total Bilirubin 2.13 (*) 0.20 - 1.20 mg/dL   Alkaline Phosphatase 114  40 - 150 U/L   AST 55 (*) 5 - 34 U/L   ALT 19  0 - 55 U/L   Total Protein 6.2 (*) 6.4 - 8.3 g/dL   Albumin 2.9 (*) 3.5 - 5.0 g/dL   Calcium 9.6  8.4 - 08.6 mg/dL     Studies/Results:  No results found.  Medications: I have reviewed the patient's current medications.  We have discussed progressive rise in CA 2729, which has been a good marker for treatment of her metastatic disease. She is in agreement with continuing treatment, as it is likely that her disease is still sensitive and as there are a number of chemotherapy agents that can be considered. She will stop letrozole; she will continue zometa including treatment today. Peripheral IV access does not appear adequate for longer term chemotherapy and she agrees to [facility] (had PAS  port with initial chemo, which are no longer placed or used to my knowledge). We have discussed weekly taxol, which hopefully will have tolerable side effects, tho I have also stressed to patient and husband that any treatment will have some side effects. She has had teaching for weekly taxol by RN today.   Assessment/Plan:  1. Metastatic breast carcinoma involving bone and liver: progression on letrozole. Will change to chemotherapy using PAC, likely weekly taxol. Continue zometa monthly. I do not believe that repeat scans now are necessary given other information, and patient is pleased that she does not have to have these now. 2.left ureteral stent recently  changed 3. Mild anemia: this was present prior to finding metastatic disease and improved when disease was better controlled, so likely related; also recent increased urinary tract blood loss. 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 Rash to IV contrast 8.degenerative changes left knee  Patient and husband followed discussion well and were in agreement with plan.   Manahil Vanzile P, MD   07/28/2012, 3:40 PM

## 2012-07-29 ENCOUNTER — Other Ambulatory Visit: Payer: Self-pay | Admitting: Radiology

## 2012-07-30 ENCOUNTER — Other Ambulatory Visit: Payer: Self-pay | Admitting: Radiology

## 2012-07-31 ENCOUNTER — Other Ambulatory Visit: Payer: Self-pay | Admitting: Oncology

## 2012-08-02 ENCOUNTER — Other Ambulatory Visit: Payer: Self-pay | Admitting: Pharmacist

## 2012-08-02 ENCOUNTER — Other Ambulatory Visit: Payer: Self-pay | Admitting: *Deleted

## 2012-08-02 DIAGNOSIS — C50919 Malignant neoplasm of unspecified site of unspecified female breast: Secondary | ICD-10-CM

## 2012-08-02 MED ORDER — DEXAMETHASONE 4 MG PO TABS: ORAL_TABLET | ORAL | Status: DC

## 2012-08-02 MED ORDER — ONDANSETRON 8 MG PO TBDP: ORAL_TABLET | ORAL | Status: DC

## 2012-08-03 ENCOUNTER — Telehealth: Payer: Self-pay | Admitting: *Deleted

## 2012-08-03 NOTE — Telephone Encounter (Signed)
Per staff message and POF I have scheduled appts.  JMW  

## 2012-08-03 NOTE — Telephone Encounter (Signed)
Per staff message and POF I have scheduled appts. Lefty message for new date week of May 26 th. JMW

## 2012-08-04 ENCOUNTER — Encounter (HOSPITAL_COMMUNITY): Payer: Self-pay | Admitting: Pharmacy Technician

## 2012-08-05 ENCOUNTER — Telehealth: Payer: Self-pay | Admitting: *Deleted

## 2012-08-05 NOTE — Telephone Encounter (Signed)
Per staff message and POF I have scheduled appts.  JMW  

## 2012-08-06 ENCOUNTER — Other Ambulatory Visit: Payer: Self-pay

## 2012-08-06 ENCOUNTER — Ambulatory Visit (HOSPITAL_COMMUNITY)
Admission: RE | Admit: 2012-08-06 | Discharge: 2012-08-06 | Disposition: A | Discharge status: Home / Self Care | Payer: Medicare Other | Source: Ambulatory Visit | Attending: Oncology | Admitting: Oncology

## 2012-08-06 ENCOUNTER — Other Ambulatory Visit: Payer: Self-pay | Admitting: Oncology

## 2012-08-06 DIAGNOSIS — C50919 Malignant neoplasm of unspecified site of unspecified female breast: Secondary | ICD-10-CM | POA: Insufficient documentation

## 2012-08-06 DIAGNOSIS — M810 Age-related osteoporosis without current pathological fracture: Secondary | ICD-10-CM | POA: Insufficient documentation

## 2012-08-06 DIAGNOSIS — C786 Secondary malignant neoplasm of retroperitoneum and peritoneum: Secondary | ICD-10-CM | POA: Insufficient documentation

## 2012-08-06 DIAGNOSIS — C7951 Secondary malignant neoplasm of bone: Secondary | ICD-10-CM | POA: Insufficient documentation

## 2012-08-06 DIAGNOSIS — C7952 Secondary malignant neoplasm of bone marrow: Secondary | ICD-10-CM | POA: Insufficient documentation

## 2012-08-06 DIAGNOSIS — Z79899 Other long term (current) drug therapy: Secondary | ICD-10-CM | POA: Insufficient documentation

## 2012-08-06 DIAGNOSIS — E890 Postprocedural hypothyroidism: Secondary | ICD-10-CM | POA: Insufficient documentation

## 2012-08-06 LAB — PROTIME-INR: INR: 1.56 — ABNORMAL HIGH (ref 0.00–1.49)

## 2012-08-06 LAB — CBC
HCT: 30.8 % — ABNORMAL LOW (ref 36.0–46.0)
Hemoglobin: 10.3 g/dL — ABNORMAL LOW (ref 12.0–15.0)
MCH: 33.8 pg (ref 26.0–34.0)
MCHC: 33.4 g/dL (ref 30.0–36.0)

## 2012-08-06 LAB — APTT: aPTT: 47 seconds — ABNORMAL HIGH (ref 24–37)

## 2012-08-06 MED ORDER — VANCOMYCIN HCL IN DEXTROSE 1-5 GM/200ML-% IV SOLN: 1000.0000 mg | INTRAVENOUS | Status: DC

## 2012-08-06 MED ORDER — FENTANYL CITRATE 0.05 MG/ML IJ SOLN
INTRAMUSCULAR | Status: AC
  Filled 2012-08-06: qty 6

## 2012-08-06 MED ORDER — MIDAZOLAM HCL 2 MG/2ML IJ SOLN
INTRAMUSCULAR | Status: AC | PRN
  Administered 2012-08-06 (×4): 1 mg via INTRAVENOUS

## 2012-08-06 MED ORDER — VANCOMYCIN HCL IN DEXTROSE 1-5 GM/200ML-% IV SOLN
INTRAVENOUS | Status: AC
  Filled 2012-08-06: qty 200

## 2012-08-06 MED ORDER — CEFAZOLIN SODIUM-DEXTROSE 2-3 GM-% IV SOLR
INTRAVENOUS | Status: AC
  Filled 2012-08-06: qty 50

## 2012-08-06 MED ORDER — HEPARIN SOD (PORK) LOCK FLUSH 100 UNIT/ML IV SOLN
INTRAVENOUS | Status: AC | PRN
  Administered 2012-08-06: 500 [IU]

## 2012-08-06 MED ORDER — SODIUM CHLORIDE 0.9 % IV SOLN: INTRAVENOUS | Status: DC

## 2012-08-06 MED ORDER — LIDOCAINE HCL 1 % IJ SOLN
INTRAMUSCULAR | Status: AC
  Filled 2012-08-06: qty 20

## 2012-08-06 MED ORDER — MIDAZOLAM HCL 2 MG/2ML IJ SOLN
INTRAMUSCULAR | Status: AC
  Filled 2012-08-06: qty 6

## 2012-08-06 MED ORDER — ONDANSETRON HCL 4 MG/2ML IJ SOLN
4.0000 mg | Freq: Once | INTRAMUSCULAR | Status: AC
  Administered 2012-08-06: 4 mg via INTRAVENOUS

## 2012-08-06 MED ORDER — ONDANSETRON HCL 4 MG/2ML IJ SOLN
INTRAMUSCULAR | Status: AC
  Filled 2012-08-06: qty 2

## 2012-08-06 MED ORDER — CEFAZOLIN SODIUM-DEXTROSE 2-3 GM-% IV SOLR
2.0000 g | INTRAVENOUS | Status: AC
  Administered 2012-08-06: 2 g via INTRAVENOUS

## 2012-08-06 MED ORDER — LIDOCAINE-PRILOCAINE 2.5-2.5 % EX CREA: TOPICAL_CREAM | CUTANEOUS | Status: DC | PRN

## 2012-08-06 MED ORDER — FENTANYL CITRATE 0.05 MG/ML IJ SOLN
INTRAMUSCULAR | Status: AC | PRN
  Administered 2012-08-06 (×4): 50 ug via INTRAVENOUS

## 2012-08-06 NOTE — H&P (Signed)
Kelli [name] is an 72 y.o. female.   Chief Complaint: "I'm getting a port a cath" HPI: Patient with history of metastatic breast carcinoma presents today for port a cath placement for chemotherapy.  Past Medical History  Diagnosis Date  . Thyroid disease IN 1982    HX IODINE-131 ABLATION FOR HYPERTHYROIDISM  . Osteoporosis   . Vitamin D insufficiency   . Chronic fatigue   . Depression   . Anxiety   . Insomnia   . History of chemotherapy     Adriamycin/cytoxan followed by [facility]  08/2000/tamoxifen 06/2001-02/2003,then arimidex 02/2003-08/2009  . 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 History of radiation therapy 12/28/00-02/12/01    left breast  With Dr. Jamie Kato  . Hypothyroidism   . Fatigue   . Short of breath on exertion   . Breast cancer, left JUNE OF 2002    LEFT BREAST, ER/PR +, HER 2 -  . 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 Metastatic cancer to liver 09/24/11    CURRENTLY RECEIVING RADIATION  . Radiation 10/15/11-10/28/2011    left proximal leg  . Hydronephrosis, left     SECONDARY TO RETROPERITONEAL METS    Past Surgical History  Procedure Laterality Date  . Cholecystectomy  1970  . Femur im nail  09/24/2011     Procedure: INTRAMEDULLARY (IM) NAIL FEMORAL;  Surgeon: Toni Arthurs, MD;  Location: [facility];  Service: Orthopedics;  Laterality: Left;  FEMURAL NECK RX/ INTERTROCHANTERIC PATHOLOGIC FX  . Bone biopsy  09/25/2011    left medullary canal femur reaming=Metastatic Carcinoma  . Hysteroscopy w/d&c  01-11-2003    REMOVAL POLYP  . Left partial mastectomy / sentinel lymph node bx  08-28-2000    LEFT BREAST CANCER  . Cysto/ left retrograde pyleogram/  left ureteral stent placement  10-30-2011  . Cystoscopy w/ ureteral stent placement Left 07/22/2012    Procedure: CYSTOSCOPY WITH STENT REPLACEMENT;  Surgeon: Marcine Matar, MD;  Location: [facility];  Service: Urology;  Laterality: Left;    No family history on  file. Social History:  reports that she has never smoked. She has never used smokeless tobacco. She reports that she does not drink alcohol or use illicit drugs.  Allergies:  Allergies  Allergen Reactions  . Cephalexin Nausea Only  . Contrast Media (Iodinated Diagnostic Agents) Rash    GIVE BENADRYL PRIOR  . Other Nausea And Vomiting and Rash    "MYCIN" DRUGS  . Sulfa Antibiotics Nausea And Vomiting and Rash    Current outpatient prescriptions:diclofenac sodium (VOLTAREN) 1 % GEL, Apply 2 g topically 2 (two) times daily as needed (for pain). Apply to knee, Disp: , Rfl: ;  FLUoxetine (PROZAC) 20 MG capsule, Take 20 mg by mouth daily at 12 noon., Disp: , Rfl: ;  HYDROcodone-acetaminophen (NORCO/VICODIN) 5-325 MG per tablet, Take 1 tablet by mouth every 6 (six) hours as needed for pain., Disp: , Rfl:  levothyroxine (SYNTHROID, LEVOTHROID) 75 MCG tablet, Take 75 mcg by mouth every morning. , Disp: , Rfl: ;  LORazepam (ATIVAN) 0.5 MG tablet, Take 1 tablet (0.5 mg total) by mouth every 8 (eight) hours as needed. For anxiety, Disp: 30 tablet, Rfl: 0;  Simethicone (GAS-X PO), Take 1 tablet by mouth daily as needed (for gas). , Disp: , Rfl:  sodium chloride (OCEAN) 0.65 % nasal spray, Place 1 spray into the nose as needed (to keep nasal passages moist). Use every 1-2 hours while awake to  keep nasal passages moist., Disp: 30 mL, Rfl: 12;  zolpidem (AMBIEN) 5 MG tablet, Take 5 mg by mouth at bedtime as needed for sleep., Disp: , Rfl: ;  dexamethasone (DECADRON) 4 MG tablet, TAKE FIVE TABS WITH FOOD 12 HOURS AND 6 HOURS PRIOR TO TAXOL., Disp: 10 tablet, Rfl: 0 ondansetron (ZOFRAN-ODT) 8 MG disintegrating tablet, Take 8 mg by mouth every 12 (twelve) hours as needed for nausea., Disp: , Rfl: ;  temazepam (RESTORIL) 15 MG capsule, Take 1 capsule (15 mg total) by mouth at bedtime as needed for sleep., Disp: 30 capsule, Rfl: 0 Current facility-administered medications:0.9 %  sodium chloride infusion, , Intravenous,  Continuous, D Kevin Malena Timpone, PA-C;  vancomycin (VANCOCIN) IVPB 1000 mg/200 mL premix, 1,000 mg, Intravenous, On Call, D Jeananne Rama, PA-C   Results for orders placed during the hospital encounter of 08/06/12 (from the past 48 hour(s))  APTT     Status: Abnormal   Collection Time    08/06/12  1:15 PM      Result Value Range   aPTT 47 (*) 24 - 37 seconds   Comment:            IF BASELINE aPTT IS ELEVATED,     SUGGEST PATIENT RISK ASSESSMENT     BE USED TO DETERMINE APPROPRIATE     ANTICOAGULANT THERAPY.  CBC     Status: Abnormal   Collection Time    08/06/12  1:15 PM      Result Value Range   WBC 10.5  4.0 - 10.5 K/uL   RBC 3.05 (*) 3.87 - 5.11 MIL/uL   Hemoglobin 10.3 (*) 12.0 - 15.0 g/dL   HCT 11.9 (*) 14.7 - 82.9 %   MCV 101.0 (*) 78.0 - 100.0 fL   MCH 33.8  26.0 - 34.0 pg   MCHC 33.4  30.0 - 36.0 g/dL   RDW 56.2 (*) 13.0 - 86.5 %   Platelets 130 (*) 150 - 400 K/uL  PROTIME-INR     Status: Abnormal   Collection Time    08/06/12  1:15 PM      Result Value Range   Prothrombin Time 18.2 (*) 11.6 - 15.2 seconds   INR 1.56 (*) 0.00 - 1.49   No results found.  Review of Systems  Constitutional: Negative for fever and chills.  Respiratory: Negative for cough and shortness of breath.   Cardiovascular: Negative for chest pain.  Gastrointestinal: Negative for nausea and vomiting.       Occ abd pain  Genitourinary: Positive for urgency and frequency. Negative for dysuria and hematuria.  Musculoskeletal: Negative for back pain.  Neurological: Negative for headaches.  Endo/Heme/Allergies: Does not bruise/bleed easily.    Blood pressure 121/74, pulse 102, temperature 97.9 F (36.6 C), temperature source Oral, resp. rate 20, SpO2 97.00%. Physical Exam  Constitutional: She is oriented to person, place, and time. She appears well-developed and well-nourished.  Cardiovascular: Regular rhythm.   Mildly tachycardic  Respiratory: Effort normal and breath sounds normal.  GI: Soft.  Bowel sounds are normal. There is no tenderness.  Musculoskeletal: Normal range of motion. She exhibits no edema.  Neurological: She is alert and oriented to person, place, and time.     Assessment/Plan Pt with metastatic breast carcinoma. Plan is for placement of a port a cath today for chemotherapy. Details/risks of procedure d/w pt/husband with their understanding and consent.  Jacorey Donaway,D KEVIN 08/06/2012, 1:55 PM

## 2012-08-06 NOTE — Progress Notes (Signed)
CSW received referral regarding patient feeling hopeless and depressed. CSW met with pt to complete assessment. Patient shared she was diagnosed with stage II breast cancer in 2002 completed treatment and was in remission. Patient stated however now the cancer has returned and is stage IV and has gone to her bone, her liver, and had to have a rod placed, and a stent in her kidney. Pt states, "The first time, I was ok and motivated to do treatment, however now I feel helpless and hopeless. If I'm still going to die, maybe I shouldn't go through all the treatment and it was be easier if I would just pass away." Patient stated that at this time she has chosen treatment and does not wish to change her mind on treatment. Patient states, "I still feel hopeless, helpless, mad, bitter, and angry." Patient states she has thought at times,  "what if I take all of my pills". Patient states this was not today.Patient states, "I would never do that though, because I wouldn't want to do that to my husband, my children, my grandchildren, or my dogs." Patient states, "I feel like I'm not any good for my grandchildren though because I get so worn out when I spend time with them." Pt reports symptoms of depression including, despondent, anhedonia, isolating, fatigue, and decreased appetite.    Patient states she is able to contract for safety but would like to speak with another counselor at the cancer center and possibly a psychiatrist to help cope with this new diagnosis of stage IV cancer. Patient plans to return home with pt husband. Pt states she has strong support form her husband, and her children. Patient and CSW discussed risks for suicide and prevention of suicide. Patient provided outpatient resources, a referral to [facility] LCS[facility], and a mobile crisis number. Pt and CSW discussed that if she had thoughts of wanting to take all of her pills or other thoughts of suicide to call 911.   Patient thanked CSW for concern  and support and plans to follow up with cancer center and outpatient referrals provided by CSW.   Marland KitchenNo further Clinical Social Work needs, signing off.   Catha Gosselin, LCSWA  [phone] .08/06/2012 1421pm

## 2012-08-06 NOTE — Procedures (Signed)
Successful placement of right jugular Port.  Tip in lower SVC and ready to use.  No immediate complication.

## 2012-08-06 NOTE — Progress Notes (Signed)
Told Mr. Gerome Apley that the EMLA Cream would be sent to their pharmacy.  Told him to get press and seal to put over medicationto hold it in place. Mr. Zambrana stated that there is a message left for Hector Shade to schedule her treatment later than 0800 on 08-09-12 or another day.  Reminded him of steroid prep to be taken 12 and 6 hours prior to Taxol.  Husband verbalixed understanding.

## 2012-08-09 ENCOUNTER — Ambulatory Visit: Payer: Medicare Other

## 2012-08-09 ENCOUNTER — Other Ambulatory Visit: Payer: Medicare Other | Admitting: Lab

## 2012-08-09 ENCOUNTER — Telehealth: Payer: Self-pay | Admitting: Oncology

## 2012-08-09 ENCOUNTER — Other Ambulatory Visit: Payer: Self-pay | Admitting: Oncology

## 2012-08-10 ENCOUNTER — Telehealth: Payer: Self-pay | Admitting: Oncology

## 2012-08-10 ENCOUNTER — Ambulatory Visit: Payer: Medicare Other

## 2012-08-10 ENCOUNTER — Ambulatory Visit: Payer: Medicare Other | Admitting: Lab

## 2012-08-10 NOTE — Telephone Encounter (Signed)
S/w LL re timing issue - not able to get back w/pt re appt for today in time for pt to take meds. Pt lb/tx moved to 5/22 and appt w/LL moved from 5/21 to 5/27 @ 11am per LL due to this was a post tx f/u. S/w pt spouse re appts for 5/22 and 5/27. Spouse also aware that 5/21 appt is cx'd and pt has no appts today. Pt will get new appts @ 5/27 f/u - spouse aware.

## 2012-08-10 NOTE — Telephone Encounter (Signed)
lmonvm for pt/spouse on both home/cell re appt for today @ 1:30pm. Asked for a return call re message and to let me know if pt can come.

## 2012-08-11 ENCOUNTER — Ambulatory Visit: Payer: Medicare Other | Admitting: Oncology

## 2012-08-12 ENCOUNTER — Other Ambulatory Visit (HOSPITAL_BASED_OUTPATIENT_CLINIC_OR_DEPARTMENT_OTHER): Payer: Medicare Other

## 2012-08-12 ENCOUNTER — Ambulatory Visit (HOSPITAL_BASED_OUTPATIENT_CLINIC_OR_DEPARTMENT_OTHER): Payer: Medicare Other

## 2012-08-12 VITALS — BP 133/57 | HR 68 | Temp 98.0°F | Resp 16

## 2012-08-12 DIAGNOSIS — C801 Malignant (primary) neoplasm, unspecified: Secondary | ICD-10-CM

## 2012-08-12 DIAGNOSIS — C50419 Malignant neoplasm of upper-outer quadrant of unspecified female breast: Secondary | ICD-10-CM

## 2012-08-12 DIAGNOSIS — C50919 Malignant neoplasm of unspecified site of unspecified female breast: Secondary | ICD-10-CM

## 2012-08-12 DIAGNOSIS — Z5111 Encounter for antineoplastic chemotherapy: Secondary | ICD-10-CM

## 2012-08-12 DIAGNOSIS — C7952 Secondary malignant neoplasm of bone marrow: Secondary | ICD-10-CM

## 2012-08-12 DIAGNOSIS — C50912 Malignant neoplasm of unspecified site of left female breast: Secondary | ICD-10-CM

## 2012-08-12 DIAGNOSIS — C787 Secondary malignant neoplasm of liver and intrahepatic bile duct: Secondary | ICD-10-CM

## 2012-08-12 DIAGNOSIS — M84453A Pathological fracture, unspecified femur, initial encounter for fracture: Secondary | ICD-10-CM

## 2012-08-12 DIAGNOSIS — C7951 Secondary malignant neoplasm of bone: Secondary | ICD-10-CM

## 2012-08-12 LAB — CBC WITH DIFFERENTIAL/PLATELET
Basophils Absolute: 0 10*3/uL (ref 0.0–0.1)
EOS%: 0.2 % (ref 0.0–7.0)
Eosinophils Absolute: 0 10*3/uL (ref 0.0–0.5)
HCT: 31.6 % — ABNORMAL LOW (ref 34.8–46.6)
HGB: 10.3 g/dL — ABNORMAL LOW (ref 11.6–15.9)
MCH: 33.3 pg (ref 25.1–34.0)
NEUT%: 63.8 % (ref 38.4–76.8)
lymph#: 3.7 10*3/uL — ABNORMAL HIGH (ref 0.9–3.3)

## 2012-08-12 MED ORDER — SODIUM CHLORIDE 0.9 % IJ SOLN
10.0000 mL | INTRAMUSCULAR | Status: DC | PRN
  Administered 2012-08-12: 10 mL
  Filled 2012-08-12: qty 10

## 2012-08-12 MED ORDER — DEXAMETHASONE SODIUM PHOSPHATE 10 MG/ML IJ SOLN
10.0000 mg | Freq: Once | INTRAMUSCULAR | Status: AC
  Administered 2012-08-12: 10 mg via INTRAVENOUS

## 2012-08-12 MED ORDER — HEPARIN SOD (PORK) LOCK FLUSH 100 UNIT/ML IV SOLN
500.0000 [IU] | Freq: Once | INTRAVENOUS | Status: AC | PRN
  Administered 2012-08-12: 500 [IU]
  Filled 2012-08-12: qty 5

## 2012-08-12 MED ORDER — SODIUM CHLORIDE 0.9 % IV SOLN
Freq: Once | INTRAVENOUS | Status: AC
  Administered 2012-08-12: 13:00:00 via INTRAVENOUS

## 2012-08-12 MED ORDER — DIPHENHYDRAMINE HCL 50 MG/ML IJ SOLN
50.0000 mg | Freq: Once | INTRAMUSCULAR | Status: AC
  Administered 2012-08-12: 50 mg via INTRAVENOUS

## 2012-08-12 MED ORDER — ONDANSETRON 8 MG/50ML IVPB (CHCC)
8.0000 mg | Freq: Once | INTRAVENOUS | Status: AC
  Administered 2012-08-12: 8 mg via INTRAVENOUS

## 2012-08-12 MED ORDER — FAMOTIDINE IN NACL 20-0.9 MG/50ML-% IV SOLN
20.0000 mg | Freq: Once | INTRAVENOUS | Status: AC
  Administered 2012-08-12: 20 mg via INTRAVENOUS

## 2012-08-12 MED ORDER — SODIUM CHLORIDE 0.9 % IV SOLN
80.0000 mg/m2 | Freq: Once | INTRAVENOUS | Status: AC
  Administered 2012-08-12: 132 mg via INTRAVENOUS
  Filled 2012-08-12: qty 22

## 2012-08-12 NOTE — Patient Instructions (Addendum)
[facility] Discharge Instructions for Patients Receiving Chemotherapy  Today you received the following chemotherapy agents Taxol  To help prevent nausea and vomiting after your treatment, we encourage you to take your nausea medication as needed.   If you develop nausea and vomiting that is not controlled by your nausea medication, call the clinic. If it is after clinic hours your family physician or the after hours number for the clinic or go to the Emergency Department.   BELOW ARE SYMPTOMS THAT SHOULD BE REPORTED IMMEDIATELY:  *FEVER GREATER THAN 100.5 F  *CHILLS WITH OR WITHOUT FEVER  NAUSEA AND VOMITING THAT IS NOT CONTROLLED WITH YOUR NAUSEA MEDICATION  *UNUSUAL SHORTNESS OF BREATH  *UNUSUAL BRUISING OR BLEEDING  TENDERNESS IN MOUTH AND THROAT WITH OR WITHOUT PRESENCE OF ULCERS  *URINARY PROBLEMS  *BOWEL PROBLEMS  UNUSUAL RASH Items with * indicate a potential emergency and should be followed up as soon as possible.  One of the nurses will contact you 24 hours after your treatment. Please let the nurse know about any problems that you may have experienced. Feel free to call the clinic you have any questions or concerns. The clinic phone number is [phone].   I have been informed and understand all the instructions given to me. I know to contact the clinic, my physician, or go to the Emergency Department if any problems should occur. I do not have any questions at this time, but understand that I may call the clinic during office hours   should I have any questions or need assistance in obtaining follow up care.    __________________________________________  _____________  __________ Signature of Patient or Authorized Representative            Date                   Time    __________________________________________ Nurse's Signature   Docetaxel injection What is this medicine? DOCETAXEL (doe se TAX el) is a chemotherapy drug. It targets  fast dividing cells, like cancer cells, and causes these cells to die. This medicine is used to treat many types of cancers like breast cancer, certain stomach cancers, head and neck cancer, lung cancer, and prostate cancer. This medicine may be used for other purposes; ask your health care provider or pharmacist if you have questions. What should I tell my health care provider before I take this medicine? They need to know if you have any of these conditions: -infection (especially a virus infection such as chickenpox, cold sores, or herpes) -liver disease -low blood counts, like low white cell, platelet, or red cell counts -an unusual or allergic reaction to docetaxel, polysorbate 80, other chemotherapy agents, other medicines, foods, dyes, or preservatives -pregnant or trying to get pregnant -breast-feeding How should I use this medicine? This drug is given as an infusion into a vein. It is administered in a hospital or clinic by a specially trained health care professional. Talk to your pediatrician regarding the use of this medicine in children. Special care may be needed. Overdosage: If you think you have taken too much of this medicine contact a poison control center or emergency room at once. NOTE: This medicine is only for you. Do not share this medicine with others. What if I miss a dose? It is important not to miss your dose. Call your doctor or health care professional if you are unable to keep an appointment. What may interact with this medicine? -cyclosporine -erythromycin -ketoconazole -medicines  to increase blood counts like filgrastim, pegfilgrastim, sargramostim -vaccines Talk to your doctor or health care professional before taking any of these medicines: -acetaminophen -aspirin -ibuprofen -ketoprofen -naproxen This list may not describe all possible interactions. Give your health care provider a list of all the medicines, herbs, non-prescription drugs, or dietary  supplements you use. Also tell them if you smoke, drink alcohol, or use illegal drugs. Some items may interact with your medicine. What should I watch for while using this medicine? Your condition will be monitored carefully while you are receiving this medicine. You will need important blood work done while you are taking this medicine. This drug may make you feel generally unwell. This is not uncommon, as chemotherapy can affect healthy cells as well as cancer cells. Report any side effects. Continue your course of treatment even though you feel ill unless your doctor tells you to stop. In some cases, you may be given additional medicines to help with side effects. Follow all directions for their use. Call your doctor or health care professional for advice if you get a fever, chills or sore throat, or other symptoms of a cold or flu. Do not treat yourself. This drug decreases your body's ability to fight infections. Try to avoid being around people who are sick. This medicine may increase your risk to bruise or bleed. Call your doctor or health care professional if you notice any unusual bleeding. Be careful brushing and flossing your teeth or using a toothpick because you may get an infection or bleed more easily. If you have any dental work done, tell your dentist you are receiving this medicine. Avoid taking products that contain aspirin, acetaminophen, ibuprofen, naproxen, or ketoprofen unless instructed by your doctor. These medicines may hide a fever. Do not become pregnant while taking this medicine. Women should inform their doctor if they wish to become pregnant or think they might be pregnant. There is a potential for serious side effects to an unborn child. Talk to your health care professional or pharmacist for more information. Do not breast-feed an infant while taking this medicine. What side effects may I notice from receiving this medicine? Side effects that you should report to your  doctor or health care professional as soon as possible: -allergic reactions like skin rash, itching or hives, swelling of the face, lips, or tongue -low blood counts - This drug may decrease the number of white blood cells, red blood cells and platelets. You may be at increased risk for infections and bleeding. -signs of infection - fever or chills, cough, sore throat, pain or difficulty passing urine -signs of decreased platelets or bleeding - bruising, pinpoint red spots on the skin, black, tarry stools, nosebleeds -signs of decreased red blood cells - unusually weak or tired, fainting spells, lightheadedness -breathing problems -fast or irregular heartbeat -low blood pressure -mouth sores -nausea and vomiting -pain, swelling, redness or irritation at the injection site -pain, tingling, numbness in the hands or feet -swelling of the ankle, feet, hands -weight gain Side effects that usually do not require medical attention (report to your prescriber or health care professional if they continue or are bothersome): -bone pain -complete hair loss including hair on your head, underarms, pubic hair, eyebrows, and eyelashes -diarrhea -excessive tearing -changes in the color of fingernails -loosening of the fingernails -nausea -muscle pain -red flush to skin -sweating -weak or tired This list may not describe all possible side effects. Call your doctor for medical advice about side effects. You may  report side effects to FDA at 1-800-FDA-1088. Where should I keep my medicine? This drug is given in a hospital or clinic and will not be stored at home. NOTE: This sheet is a summary. It may not cover all possible information. If you have questions about this medicine, talk to your doctor, pharmacist, or health care provider.  2013, [employer]. (02/21/2008 11:52:10 AM)

## 2012-08-16 ENCOUNTER — Other Ambulatory Visit: Payer: Self-pay | Admitting: Oncology

## 2012-08-17 ENCOUNTER — Ambulatory Visit (HOSPITAL_BASED_OUTPATIENT_CLINIC_OR_DEPARTMENT_OTHER): Payer: Medicare Other | Admitting: Oncology

## 2012-08-17 ENCOUNTER — Ambulatory Visit (HOSPITAL_BASED_OUTPATIENT_CLINIC_OR_DEPARTMENT_OTHER): Payer: Medicare Other

## 2012-08-17 ENCOUNTER — Telehealth: Payer: Self-pay | Admitting: Oncology

## 2012-08-17 ENCOUNTER — Ambulatory Visit (HOSPITAL_BASED_OUTPATIENT_CLINIC_OR_DEPARTMENT_OTHER): Payer: Medicare Other | Admitting: Lab

## 2012-08-17 ENCOUNTER — Encounter: Payer: Self-pay | Admitting: Oncology

## 2012-08-17 VITALS — BP 119/61 | HR 89 | Temp 97.5°F | Resp 18

## 2012-08-17 DIAGNOSIS — C50919 Malignant neoplasm of unspecified site of unspecified female breast: Secondary | ICD-10-CM

## 2012-08-17 DIAGNOSIS — C800 Disseminated malignant neoplasm, unspecified: Secondary | ICD-10-CM

## 2012-08-17 DIAGNOSIS — R040 Epistaxis: Secondary | ICD-10-CM

## 2012-08-17 DIAGNOSIS — C50912 Malignant neoplasm of unspecified site of left female breast: Secondary | ICD-10-CM

## 2012-08-17 DIAGNOSIS — R112 Nausea with vomiting, unspecified: Secondary | ICD-10-CM

## 2012-08-17 DIAGNOSIS — R52 Pain, unspecified: Secondary | ICD-10-CM

## 2012-08-17 LAB — COMPREHENSIVE METABOLIC PANEL (CC13)
AST: 84 U/L — ABNORMAL HIGH (ref 5–34)
Alkaline Phosphatase: 110 U/L (ref 40–150)
BUN: 16.1 mg/dL (ref 7.0–26.0)
Creatinine: 0.8 mg/dL (ref 0.6–1.1)
Potassium: 4.6 mEq/L (ref 3.5–5.1)
Total Bilirubin: 1.44 mg/dL — ABNORMAL HIGH (ref 0.20–1.20)

## 2012-08-17 MED ORDER — SODIUM CHLORIDE 0.9 % IV SOLN
Freq: Once | INTRAVENOUS | Status: AC
  Administered 2012-08-17: 12:00:00 via INTRAVENOUS

## 2012-08-17 MED ORDER — FAMOTIDINE IN NACL 20-0.9 MG/50ML-% IV SOLN
20.0000 mg | Freq: Once | INTRAVENOUS | Status: AC
  Administered 2012-08-17: 20 mg via INTRAVENOUS

## 2012-08-17 MED ORDER — ONDANSETRON 16 MG/50ML IVPB (CHCC)
16.0000 mg | Freq: Once | INTRAVENOUS | Status: AC
  Administered 2012-08-17: 16 mg via INTRAVENOUS

## 2012-08-17 NOTE — Progress Notes (Signed)
OFFICE PROGRESS NOTE   08/17/2012   Physicians:J.Hewitt, J.Kinard, J.Beekman, W.Elkins, M.Altheimer, S.Dahlstedt, C.Ezzard Standing, V. 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 She is also on zometa q 4 weeks, last 08-06-12. Patient has had nausea, poor po intake and general aching since first taxol on 08-06-12, tho she did not contact our office with those problems. She had PAC placed by IR on 08-06-12.  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 She was treated with lumpectomy and 18 node axillary dissection, adjuvant adriamycin/cytoxan followed by [facility] chemotherapy, local radiation, tamoxifen from April 2003 thru Dec 2003 then arimidex from Dec 2004 thru June 2011. The arimidex had been continued for that duration due to high risk features, stopped as there had been no evidence of disease and with known osteoporosis. She was clinically stable and labs were normal other than very slight decrease in hemoglobin when I saw her in Jan 2013. She developed acute pain in left hip in June 2013, with MRI hip 09-20-11 showing metastatic disease with pathologic fracture of femoral neck, with nailing by Dr Victorino Dike on 09-24-11. Pathology confirmed metastatic breast carcinoma, ER+ 100%, PR negative and HER 2 negative by [facility]. She had left ureteral stent by Dr Retta Diones 11-01-11 for obstructive hydronephrosis. She received radiation 3000 cGy in 10 fractions to left femur 7-24 thru 10-28-11 by Dr Roselind Messier. Bolero 4 study was started 11-12-11. Everolimus dose was reduced due to epistaxis. Restaging scans done per protocol in  Dec 2013 (bone scan, CT chest, MRI abdomen and pelvis) all showed further improvement in metastatic disease, without other progression. Everolimus was discontinued after 03-03-12 due to dyspnea. Respiratory problems were evaluated with VQ, echocardiogram and bubble echo, and PFTs by Dr Craige Cotta, and the SOB progressively has improved. She had left ureteral stent changed by Dr Retta Diones on 07-22-2012.  CA 2729 had been 354 in July 2013, down to 224 in Dec when everolimus DCd, then was 299 in March 2014, 448 in April and was up to 639 by labs drawn on 07-27-12. Plan was weekly taxol x3 every 28 days, with continued zometa.   Review of Systems Epistaxis x several hours yesterday (left) and slight bleeding from right nares today. No other bleeding. No fever, no increased SOB or cough. Nausea and taste disturbance, occasional vomiting including at office today, but has not taken antiemetic. Very little po including fluids.  General aching. Soreness across all of anterior chest since PAC was placed on left. No other new or different pain. Remainder of 10 point Review of Systems negative.  Objective:  Vital signs in last 24 hours:  BP 119/61  Pulse 89  Temp(Src) 97.5 F (36.4 C) (Oral)  Resp 18  Patient declined to stand to be weighed, seen in [facility] and again in infusion area receiving IVF. Awake, alert, looks ill. Has packing in right nares.  HEENT:PERRLA, sclera clear, anicteric and oropharynx clear, no lesions LymphaticsCervical, supraclavicular, and axillary nodes normal. Resp: clear to auscultation bilaterally Cardio: regular rate and rhythm GI: soft, not tender, not distended, few bowel sounds Extremities: extremities normal, atraumatic, no cyanosis or  edema Neuro:no sensory deficits noted Breast:left mastectomy Portacath with resolving local ecchymosis, site otherwise not remarkable. No bruising or erythema across anterior chest to left.  Patient reevaluated in infusion area after IV zofran and  with IVF in process. PAC functioning well and patient feeling much better overall.  Lab Results:  Results for orders placed in visit on 08/12/12  CBC WITH DIFFERENTIAL      Result Value Range   WBC 10.9 (*) 3.9 - 10.3 10e3/uL   NEUT# 7.0 (*) 1.5 - 6.5 10e3/uL   HGB 10.3 (*) 11.6 - 15.9 g/dL   HCT 52.8 (*) 41.3 - 24.4 %   Platelets 127 (*) 145 - 400 10e3/uL   MCV 102.3 (*) 79.5 - 101.0 fL   MCH 33.3  25.1 - 34.0 pg   MCHC 32.6  31.5 - 36.0 g/dL   RBC 0.10 (*) 2.72 - 5.36 10e6/uL   RDW 17.1 (*) 11.2 - 14.5 %   lymph# 3.7 (*) 0.9 - 3.3 10e3/uL   MONO# 0.2  0.1 - 0.9 10e3/uL   Eosinophils Absolute 0.0  0.0 - 0.5 10e3/uL   Basophils Absolute 0.0  0.0 - 0.1 10e3/uL   NEUT% 63.8  38.4 - 76.8 %   LYMPH% 34.2  14.0 - 49.7 %   MONO% 1.6  0.0 - 14.0 %   EOS% 0.2  0.0 - 7.0 %   BASO% 0.2  0.0 - 2.0 %   nRBC 0  0 - 0 %     Studies/Results:  No results found.  Medications: I have reviewed the patient's current medications. She has been given 1 liter of NS with IV zofran following visit. I have asked her to use the zofran daily for next couple of days. We will hold further chemo until I see her ~ 08-24-12, then possibly try to continue the low dose taxol with additional IVF etc. Note this taxol was chosen due to generally good side effect profile, as Mrs. Weiland has very low tolerance for side effects. I doubt she would be compliant with oral xeloda. Doxil might be possible, especially with PAC now. Fatigue from gemzar might be difficult.    Assessment/Plan: 1. Metastatic breast carcinoma involving bone and liver: progression on letrozole. Has had difficult week since first low dose taxol 5-22, better with IVF and IV zofran now. Counts ok. Plan a above  Continue zometa monthly. I do not believe that repeat scans now are necessary given other information, and patient is pleased that she does not have to have these now.  2.left ureteral stent recently changed  3. Mild anemia: this was  present prior to finding metastatic disease and improved when disease was better controlled, so likely related; also recent increased urinary tract blood loss. Follow  4.DNR but full support to that point  5.osteoporosis now on IV bisphosphonate for metastatic disease, including pathologic fracture left hip 08-2011. 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 Rash to IV contrast  8.degenerative arthritis left knee 9. PAC in. If soreness across anterior chest persists, may get xray.  Patient and husband are in agreement with plan above.  Nneoma Harral P, MD   08/17/2012, 8:11 PM

## 2012-08-17 NOTE — Telephone Encounter (Signed)
gv pt appt schedule for June.  °

## 2012-08-17 NOTE — Patient Instructions (Addendum)
Dehydration, Adult Dehydration is when you lose more fluids from the body than you take in. Vital organs like the kidneys, brain, and heart cannot function without a proper amount of fluids and salt. Any loss of fluids from the body can cause dehydration.  CAUSES   Vomiting.  Diarrhea.  Excessive sweating.  Excessive urine output.  Fever. SYMPTOMS  Mild dehydration  Thirst.  Dry lips.  Slightly dry mouth. Moderate dehydration  Very dry mouth.  Sunken eyes.  Skin does not bounce back quickly when lightly pinched and released.  Dark urine and decreased urine production.  Decreased tear production.  Headache. Severe dehydration  Very dry mouth.  Extreme thirst.  Rapid, weak pulse (more than 100 beats per minute at rest).  Cold hands and feet.  Not able to sweat in spite of heat and temperature.  Rapid breathing.  Blue lips.  Confusion and lethargy.  Difficulty being awakened.  Minimal urine production.  No tears. DIAGNOSIS  Your caregiver will diagnose dehydration based on your symptoms and your exam. Blood and urine tests will help confirm the diagnosis. The diagnostic evaluation should also identify the cause of dehydration. TREATMENT  Treatment of mild or moderate dehydration can often be done at home by increasing the amount of fluids that you drink. It is best to drink small amounts of fluid more often. Drinking too much at one time can make vomiting worse. Refer to the home care instructions below. Severe dehydration needs to be treated at the hospital where you will probably be given intravenous (IV) fluids that contain water and electrolytes. HOME CARE INSTRUCTIONS   Ask your caregiver about specific rehydration instructions.  Drink enough fluids to keep your urine clear or pale yellow.  Drink small amounts frequently if you have nausea and vomiting.  Eat as you normally do.  Avoid:  Foods or drinks high in sugar.  Carbonated  drinks.  Juice.  Extremely hot or cold fluids.  Drinks with caffeine.  Fatty, greasy foods.  Alcohol.  Tobacco.  Overeating.  Gelatin desserts.  Wash your hands well to avoid spreading bacteria and viruses.  Only take over-the-counter or prescription medicines for pain, discomfort, or fever as directed by your caregiver.  Ask your caregiver if you should continue all prescribed and over-the-counter medicines.  Keep all follow-up appointments with your caregiver. SEEK MEDICAL CARE IF:  You have abdominal pain and it increases or stays in one area (localizes).  You have a rash, stiff neck, or severe headache.  You are irritable, sleepy, or difficult to awaken.  You are weak, dizzy, or extremely thirsty. SEEK IMMEDIATE MEDICAL CARE IF:   You are unable to keep fluids down or you get worse despite treatment.  You have frequent episodes of vomiting or diarrhea.  You have blood or green matter (bile) in your vomit.  You have blood in your stool or your stool looks black and tarry.  You have not urinated in 6 to 8 hours, or you have only urinated a small amount of very dark urine.  You have a fever.  You faint. MAKE SURE YOU:   Understand these instructions.  Will watch your condition.  Will get help right away if you are not doing well or get worse. Document Released: 03/10/2005 Document Revised: 06/02/2011 Document Reviewed: 10/28/2010 Exit[employer] Patient Information 2014 [employer].  

## 2012-08-19 ENCOUNTER — Other Ambulatory Visit: Payer: Medicare Other | Admitting: Lab

## 2012-08-19 ENCOUNTER — Ambulatory Visit: Payer: Medicare Other

## 2012-08-23 ENCOUNTER — Ambulatory Visit: Payer: Medicare Other

## 2012-08-24 ENCOUNTER — Encounter: Payer: Self-pay | Admitting: Oncology

## 2012-08-24 ENCOUNTER — Ambulatory Visit (HOSPITAL_BASED_OUTPATIENT_CLINIC_OR_DEPARTMENT_OTHER): Payer: Medicare Other | Admitting: Oncology

## 2012-08-24 ENCOUNTER — Other Ambulatory Visit: Payer: Self-pay

## 2012-08-24 ENCOUNTER — Ambulatory Visit (HOSPITAL_BASED_OUTPATIENT_CLINIC_OR_DEPARTMENT_OTHER): Payer: Medicare Other | Admitting: Lab

## 2012-08-24 VITALS — BP 123/61 | HR 78 | Temp 97.6°F | Resp 19 | Ht 64.0 in | Wt 136.1 lb

## 2012-08-24 DIAGNOSIS — C7952 Secondary malignant neoplasm of bone marrow: Secondary | ICD-10-CM

## 2012-08-24 DIAGNOSIS — C50912 Malignant neoplasm of unspecified site of left female breast: Secondary | ICD-10-CM

## 2012-08-24 DIAGNOSIS — C787 Secondary malignant neoplasm of liver and intrahepatic bile duct: Secondary | ICD-10-CM

## 2012-08-24 DIAGNOSIS — C50419 Malignant neoplasm of upper-outer quadrant of unspecified female breast: Secondary | ICD-10-CM

## 2012-08-24 DIAGNOSIS — D709 Neutropenia, unspecified: Secondary | ICD-10-CM

## 2012-08-24 LAB — CBC WITH DIFFERENTIAL/PLATELET
Basophils Absolute: 0 10*3/uL (ref 0.0–0.1)
EOS%: 1.8 % (ref 0.0–7.0)
HCT: 28.3 % — ABNORMAL LOW (ref 34.8–46.6)
HGB: 9.1 g/dL — ABNORMAL LOW (ref 11.6–15.9)
MCH: 32.4 pg (ref 25.1–34.0)
MCV: 100.7 fL (ref 79.5–101.0)
MONO%: 14.1 % — ABNORMAL HIGH (ref 0.0–14.0)
NEUT%: 6.5 % — ABNORMAL LOW (ref 38.4–76.8)
Platelets: 105 10*3/uL — ABNORMAL LOW (ref 145–400)

## 2012-08-24 MED ORDER — CIPROFLOXACIN HCL 250 MG PO TABS: 250.0000 mg | ORAL_TABLET | Freq: Two times a day (BID) | ORAL | Status: DC

## 2012-08-24 MED ORDER — SUCRALFATE 1 G PO TABS: 1.0000 g | ORAL_TABLET | Freq: Three times a day (TID) | ORAL | Status: AC

## 2012-08-24 MED ORDER — FILGRASTIM 300 MCG/0.5ML IJ SOLN
300.0000 ug | Freq: Once | INTRAMUSCULAR | Status: AC
  Administered 2012-08-24: 300 ug via SUBCUTANEOUS
  Filled 2012-08-24: qty 0.5

## 2012-08-24 NOTE — Progress Notes (Signed)
OFFICE PROGRESS NOTE   08/24/2012   Physicians:J.Hewitt, J.Kinard, J.Beekman, W.Elkins, M.Altheimer, S.Dahlstedt, C.Ezzard Standing, V. Sood    INTERVAL HISTORY:   Patient is seen, together with husband, in continuing attention to her metastatic breast cancer, now neutropenic day 13 from first taxol, dosed at 80 mg/m2 on 08-12-12. She needed IVF and antiemetics on 5-27 additionally. Since she was here on 5-27, she has felt generally weak and has had epigastric discomfort, but no known fever or symptoms of infection.  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 She was treated with lumpectomy and 18 node axillary dissection, adjuvant adriamycin/cytoxan followed by CMF chemotherapy, local radiation, tamoxifen from April 2003 thru Dec 2003 then arimidex from Dec 2004 thru June 2011. The arimidex had been continued for that duration due to high risk features, stopped as there had been no evidence of disease and with known osteoporosis. She was clinically stable and labs were normal other than very slight decrease in hemoglobin when I saw her in Jan 2013. She developed acute pain in left hip in June 2013, with MRI hip 09-20-11 showing metastatic disease with pathologic fracture of femoral neck, with nailing by Dr Victorino Dike on 09-24-11. Pathology confirmed metastatic breast carcinoma, ER+ 100%, PR negative and HER 2 negative by [facility]. She had left ureteral stent by Dr Retta Diones 11-01-11 for obstructive hydronephrosis. She received radiation 3000 cGy in 10 fractions to left femur 7-24 thru 10-28-11 by Dr Roselind Messier. Bolero 4 study was started 11-12-11. Everolimus dose was reduced due to epistaxis. Restaging scans done per protocol in Dec 2013 (bone scan, CT chest, MRI abdomen and pelvis) all showed further improvement in metastatic disease, without other progression. Everolimus was discontinued  after 03-03-12 due to dyspnea. Respiratory problems were evaluated with VQ, echocardiogram and bubble echo, and PFTs by Dr Craige Cotta, and the SOB progressively has improved. She had left ureteral stent changed by Dr Retta Diones on 07-22-2012. CA 2729 had been 354 in July 2013, down to 224 in Dec when everolimus DCd, then was 299 in March 2014, 448 in April and was up to 639 by labs drawn on 07-27-12. Letrozole was discontinued on 07-28-12 and first taxol given 08-12-12 (80 mg/m2).  Review of Systems General aching from taxol resolved. Drinking some fluids, little appetite. Epigastric uneasiness not significantly improved with zofran yesterday, continues daily protonix, had diarrhea after dulcolax for constipation. No bladder symptoms. No bone pain. Difficulty sleeping even with ambien. No bleeding, does have bruise on RUE from fall in [place] last week. Remainder of 10 point Review of Systems negative/ unchanged..  Objective:  Vital signs in last 24 hours:  BP 123/61  Pulse 78  Temp(Src) 97.6 F (36.4 C) (Oral)  Resp 19  Ht 5\' 4"  (1.626 m)  Wt 136 lb 1.6 oz (61.735 kg)  BMI 23.35 kg/m2 Weight is up 2 lbs. Alert, looks brighter than last week, walked into office.   HEENT:PERRLA, sclera clear, anicteric and oropharynx clear, no lesions No alopecia LymphaticsCervical, supraclavicular, and axillary nodes normal. Resp: clear to auscultation bilaterally and normal percussion bilaterally Cardio: regular rate and rhythm GI: soft, slightly tender epigastrium, some normal BS, hepatomegaly no change, no rub Extremities: extremities normal, atraumatic, no cyanosis or edema Neuro:no sensory deficits noted Skin: resolving 4 cm ecchymosis right upper arm. No rash. Portacath without erythema or tenderness.  Lab Results:  Results for orders placed in  visit on 08/24/12  CBC WITH DIFFERENTIAL      Result Value Range   WBC 5.1  3.9 - 10.3 10e3/uL   NEUT# 0.3 Repeated and Verified (*) 1.5 - 6.5 10e3/uL   HGB 9.1 (*)  11.6 - 15.9 g/dL   HCT 16.1 (*) 09.6 - 04.5 %   Platelets 105 (*) 145 - 400 10e3/uL   MCV 100.7  79.5 - 101.0 fL   MCH 32.4  25.1 - 34.0 pg   MCHC 32.2  31.5 - 36.0 g/dL   RBC 4.09 (*) 8.11 - 9.14 10e6/uL   RDW 16.9 (*) 11.2 - 14.5 %   lymph# 3.9 (*) 0.9 - 3.3 10e3/uL   MONO# 0.7  0.1 - 0.9 10e3/uL   Eosinophils Absolute 0.1  0.0 - 0.5 10e3/uL   Basophils Absolute 0.0  0.0 - 0.1 10e3/uL   NEUT% 6.5 (*) 38.4 - 76.8 %   LYMPH% 77.0 (*) 14.0 - 49.7 %   MONO% 14.1 (*) 0.0 - 14.0 %   EOS% 1.8  0.0 - 7.0 %   BASO% 0.6  0.0 - 2.0 %   nRBC 1 (*) 0 - 0 %     Studies/Results:  No results found.  Medications: I have reviewed the patient's current medications. She will try plain colace for now. She is given neupogen 300 now, and will have this again on 6-4; she will have repeat CBC on 6-5 with neupogen then if ANC <1.5, and repeat CBC/ possible neupogen 6-6 if ANC still <=1.0. She will be on cipro 250 mg bid until ANC recovered. Planned chemo 6-4 has been cancelled.  We have discussed trying single agent taxol again with >=50% dose reduction after counts recover. We will discuss this further when I see her back within the week.  She understands neutropenic precautions and that she is to call if temp >=100.5  Assessment/Plan: 1. Metastatic breast carcinoma involving bone and liver: progression on letrozole after she came off everolimus due to respiratory complications. She has had a difficult time with low dose taxol, including neutropenia now. Plan as above. Continue zometa monthly, due ~ June 13.  2.left ureteral stent followed by urology 3. Mild anemia: this was present prior to finding metastatic disease and improved when disease was better controlled, so likely related; also recent increased urinary tract blood loss. 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 Rash to IV  contrast  8.epigastric symptoms: continue protonix, add carafate 1 gm ac/hs.   Not surprisingly, she is very upset about change in physician at this office, which I have had to tell her about today. Husband is very supportive, as always.  Reece Packer, MD   08/24/2012, 8:42 PM

## 2012-08-24 NOTE — Patient Instructions (Addendum)
Continue protonix daily. We will add carafate (sucralfate) 1 gm 4 x daily, which is usually before meals and bedtime. The Carafate coats your stomach and can help irritation this way. Sometimes it is easiest to dissolve the carafate in a small amount of water and drink it.  Try plain stool softner like colace, without laxative, once or twice daily.  We will also put you on antibiotic cipro (ciprofloxacin) 250 mg twice daily while white blood count is so low. Please start this today. When your white count comes back up, you can stop this medicine.  Call if your temperature >=100.5 or if symptoms of infection.

## 2012-08-25 ENCOUNTER — Other Ambulatory Visit: Payer: Self-pay | Admitting: Oncology

## 2012-08-25 ENCOUNTER — Other Ambulatory Visit: Payer: Medicare Other | Admitting: Lab

## 2012-08-25 ENCOUNTER — Telehealth: Payer: Self-pay | Admitting: *Deleted

## 2012-08-25 ENCOUNTER — Ambulatory Visit: Payer: Medicare Other

## 2012-08-25 ENCOUNTER — Ambulatory Visit (HOSPITAL_BASED_OUTPATIENT_CLINIC_OR_DEPARTMENT_OTHER): Payer: Medicare Other

## 2012-08-25 VITALS — BP 118/49 | HR 102 | Temp 98.7°F

## 2012-08-25 DIAGNOSIS — Z5189 Encounter for other specified aftercare: Secondary | ICD-10-CM

## 2012-08-25 DIAGNOSIS — C50912 Malignant neoplasm of unspecified site of left female breast: Secondary | ICD-10-CM

## 2012-08-25 DIAGNOSIS — C50419 Malignant neoplasm of upper-outer quadrant of unspecified female breast: Secondary | ICD-10-CM

## 2012-08-25 MED ORDER — FILGRASTIM 300 MCG/0.5ML IJ SOLN
300.0000 ug | Freq: Once | INTRAMUSCULAR | Status: AC
  Administered 2012-08-25: 300 ug via SUBCUTANEOUS
  Filled 2012-08-25: qty 0.5

## 2012-08-25 NOTE — Telephone Encounter (Signed)
Per staff message and POF I have scheduled appts.  JMW  

## 2012-08-26 ENCOUNTER — Other Ambulatory Visit: Payer: Self-pay

## 2012-08-26 ENCOUNTER — Other Ambulatory Visit (HOSPITAL_BASED_OUTPATIENT_CLINIC_OR_DEPARTMENT_OTHER): Payer: Medicare Other | Admitting: Lab

## 2012-08-26 ENCOUNTER — Ambulatory Visit: Payer: Medicare Other

## 2012-08-26 DIAGNOSIS — C50912 Malignant neoplasm of unspecified site of left female breast: Secondary | ICD-10-CM

## 2012-08-26 DIAGNOSIS — C50919 Malignant neoplasm of unspecified site of unspecified female breast: Secondary | ICD-10-CM

## 2012-08-26 DIAGNOSIS — C50419 Malignant neoplasm of upper-outer quadrant of unspecified female breast: Secondary | ICD-10-CM

## 2012-08-26 LAB — CBC WITH DIFFERENTIAL/PLATELET
Eosinophils Absolute: 0.1 10*3/uL (ref 0.0–0.5)
MONO#: 2.4 10*3/uL — ABNORMAL HIGH (ref 0.1–0.9)
NEUT#: 6.6 10*3/uL — ABNORMAL HIGH (ref 1.5–6.5)
RBC: 2.61 10*6/uL — ABNORMAL LOW (ref 3.70–5.45)
RDW: 17.6 % — ABNORMAL HIGH (ref 11.2–14.5)
WBC: 15.4 10*3/uL — ABNORMAL HIGH (ref 3.9–10.3)
nRBC: 6 % — ABNORMAL HIGH (ref 0–0)

## 2012-08-26 MED ORDER — DEXAMETHASONE 4 MG PO TABS: ORAL_TABLET | ORAL | Status: DC

## 2012-08-26 NOTE — Progress Notes (Signed)
Patient here for lab and possible injection appointment.  WBC/ANC   15.4/6.6 today which is up from 5.1/0.3 on 08/24/12.  Orders are to give Neupogen if ANC  <1.5.  Injection held and instructed to keep appointments as scheduled 6/11.

## 2012-08-30 ENCOUNTER — Other Ambulatory Visit: Payer: Self-pay | Admitting: Oncology

## 2012-08-30 ENCOUNTER — Telehealth: Payer: Self-pay | Admitting: Oncology

## 2012-08-30 ENCOUNTER — Other Ambulatory Visit: Payer: Self-pay | Admitting: *Deleted

## 2012-08-30 DIAGNOSIS — C7952 Secondary malignant neoplasm of bone marrow: Secondary | ICD-10-CM

## 2012-08-30 DIAGNOSIS — C800 Disseminated malignant neoplasm, unspecified: Secondary | ICD-10-CM

## 2012-08-30 MED ORDER — TEMAZEPAM 15 MG PO CAPS: 15.0000 mg | ORAL_CAPSULE | Freq: Every evening | ORAL | Status: DC | PRN

## 2012-08-30 MED ORDER — LORAZEPAM 0.5 MG PO TABS: 0.5000 mg | ORAL_TABLET | Freq: Four times a day (QID) | ORAL | Status: DC | PRN

## 2012-08-30 MED ORDER — HYDROCODONE-ACETAMINOPHEN 5-325 MG PO TABS: 1.0000 | ORAL_TABLET | Freq: Four times a day (QID) | ORAL | Status: DC | PRN

## 2012-08-30 NOTE — Telephone Encounter (Signed)
called pt and left message regarding appt on 6/10 lab and MD

## 2012-08-31 ENCOUNTER — Ambulatory Visit (HOSPITAL_BASED_OUTPATIENT_CLINIC_OR_DEPARTMENT_OTHER): Payer: Medicare Other | Admitting: Oncology

## 2012-08-31 ENCOUNTER — Encounter: Payer: Self-pay | Admitting: Oncology

## 2012-08-31 ENCOUNTER — Other Ambulatory Visit (HOSPITAL_BASED_OUTPATIENT_CLINIC_OR_DEPARTMENT_OTHER): Payer: Medicare Other

## 2012-08-31 VITALS — BP 131/62 | HR 106 | Temp 98.6°F | Resp 20 | Ht 64.0 in | Wt 140.2 lb

## 2012-08-31 DIAGNOSIS — C800 Disseminated malignant neoplasm, unspecified: Secondary | ICD-10-CM

## 2012-08-31 DIAGNOSIS — C50919 Malignant neoplasm of unspecified site of unspecified female breast: Secondary | ICD-10-CM

## 2012-08-31 DIAGNOSIS — C50912 Malignant neoplasm of unspecified site of left female breast: Secondary | ICD-10-CM

## 2012-08-31 DIAGNOSIS — M7989 Other specified soft tissue disorders: Secondary | ICD-10-CM

## 2012-08-31 DIAGNOSIS — C50419 Malignant neoplasm of upper-outer quadrant of unspecified female breast: Secondary | ICD-10-CM

## 2012-08-31 LAB — COMPREHENSIVE METABOLIC PANEL (CC13)
AST: 46 U/L — ABNORMAL HIGH (ref 5–34)
BUN: 10.9 mg/dL (ref 7.0–26.0)
Calcium: 9.3 mg/dL (ref 8.4–10.4)
Chloride: 107 mEq/L (ref 98–107)
Creatinine: 1 mg/dL (ref 0.6–1.1)
Total Bilirubin: 2.11 mg/dL — ABNORMAL HIGH (ref 0.20–1.20)

## 2012-08-31 LAB — CBC WITH DIFFERENTIAL/PLATELET
BASO%: 0.2 % (ref 0.0–2.0)
Basophils Absolute: 0 10*3/uL (ref 0.0–0.1)
HCT: 26.7 % — ABNORMAL LOW (ref 34.8–46.6)
HGB: 8.6 g/dL — ABNORMAL LOW (ref 11.6–15.9)
MONO#: 1.5 10*3/uL — ABNORMAL HIGH (ref 0.1–0.9)
NEUT%: 46.9 % (ref 38.4–76.8)
WBC: 16.1 10*3/uL — ABNORMAL HIGH (ref 3.9–10.3)
lymph#: 6.9 10*3/uL — ABNORMAL HIGH (ref 0.9–3.3)

## 2012-08-31 NOTE — Progress Notes (Signed)
OFFICE PROGRESS NOTE   08/31/2012   Physicians:J.Hewitt, J.Kinard, J.Beekman, W.Elkins, M.Altheimer, S.Dahlstedt, C.Ezzard Standing, V. 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 She had first taxol (80 mg/m2) on 08-12-12, tolerated poorly with cytopenias and aching (and alopecia) even at this dose. She is no longer neutropenic, however platelets are lower today and she continues to feel badly. She has PAC in.  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 She was treated with lumpectomy and 18 node axillary dissection, adjuvant adriamycin/cytoxan followed by CMF chemotherapy, local radiation, tamoxifen from April 2003 thru Dec 2003 then arimidex from Dec 2004 thru June 2011. The arimidex was  continued for that duration due to high risk features, stopped in June 2011 as there was no evidence of disease and with known osteoporosis. She was clinically stable and labs were normal other than very slight decrease in hemoglobin when I saw her in Jan 2013. She developed acute pain in left hip in June 2013, with MRI hip 09-20-11 showing metastatic disease with pathologic fracture of femoral neck, nailing done by Dr Victorino Dike on 09-24-11. Pathology confirmed metastatic breast carcinoma, ER+ 100%, PR negative and HER 2 negative by [facility]. She had left ureteral stent by Dr Retta Diones 11-01-11 for obstructive hydronephrosis. She received radiation 3000 cGy in 10 fractions to left femur 7-24 thru 10-28-11 by Dr Roselind Messier. Bolero 4 study was started 11-12-11. Everolimus dose was reduced due to epistaxis. Restaging scans done per protocol in Dec 2013 (bone scan, CT chest, MRI abdomen and pelvis) all showed further improvement in metastatic disease, without other progression. Everolimus was  discontinued after 03-03-12 due to dyspnea. Respiratory problems were evaluated with VQ, echocardiogram and bubble echo, and PFTs by Dr Craige Cotta, and the SOB progressively improved off of everolimus. She had left ureteral stent changed by Dr Retta Diones on 07-22-2012. CA 2729 had been 354 in July 2013, down to 224 in Dec 2013 when everolimus DCd, then was 299 in March 2014, 448 in April and was up to 639 by labs drawn on 07-27-12. Letrozole was discontinued on 07-28-12 and first taxol given 08-12-12 (80 mg/m2), not tolerated well as noted above. She is also on zometa, last given 07-28-12.   Review of Systems Fatigue, increased swelling bilateral LE, no overt bleeding or bruising. No fever or symptoms of infection. Some increased shortness of breath without cough or wheezing, no associated chest pain. More nausea, antiemetics helping some, better after eats, still on protonix. Unable to sleep despite meds, upset by physician change at this office. No different pain. No problems with PAC. Has not fallen again. Remainder of 10 point Review of Systems negative.  Objective:  Vital signs in last 24 hours:  BP 131/62  Pulse 106  Temp(Src) 98.6 F (37 C) (Oral)  Resp 20  Ht 5\' 4"  (1.626 m)  Wt 140 lb 3.2 oz (63.594 kg)  BMI 24.05 kg/m2 Weight up 4 lbs, which seems to be LE swelling. Alert, tearful, looks generally weak. Husband very supportive as always.   HEENT:PERRLA, extra ocular movement intact, sclera clear, anicteric and oropharynx clear, no lesions. Partial alopecia so shaved head LymphaticsCervical, supraclavicular, and axillary nodes normal. No inguinal adenopathy Resp: clear to auscultation bilaterally and normal percussion bilaterally Cardio: regular rate and rhythm, clear heart sounds,  no gallop GI: soft, few BS, no increased distension. Not tender epigastrium. hepatomegaly, no rub, not tender. Cannot feel spleen Extremities:2+ edema to lower thighs bilaterally, without cords or erythema Neuro:CN,  motor, sensory, cerebellar grossly nonfocal  Breast exam not repeated today Portacath-without erythema or tenderness  Lab Results:  Results for orders placed in visit on 08/31/12  CBC WITH DIFFERENTIAL      Result Value Range   WBC 16.1 (*) 3.9 - 10.3 10e3/uL   NEUT# 7.6 (*) 1.5 - 6.5 10e3/uL   HGB 8.6 (*) 11.6 - 15.9 g/dL   HCT 11.9 (*) 14.7 - 82.9 %   Platelets 60 (*) 145 - 400 10e3/uL   MCV 103.5 (*) 79.5 - 101.0 fL   MCH 33.3  25.1 - 34.0 pg   MCHC 32.2  31.5 - 36.0 g/dL   RBC 5.62 (*) 1.30 - 8.65 10e6/uL   RDW 18.9 (*) 11.2 - 14.5 %   lymph# 6.9 (*) 0.9 - 3.3 10e3/uL   MONO# 1.5 (*) 0.1 - 0.9 10e3/uL   Eosinophils Absolute 0.1  0.0 - 0.5 10e3/uL   Basophils Absolute 0.0  0.0 - 0.1 10e3/uL   NEUT% 46.9  38.4 - 76.8 %   LYMPH% 43.1  14.0 - 49.7 %   MONO% 9.4  0.0 - 14.0 %   EOS% 0.4  0.0 - 7.0 %   BASO% 0.2  0.0 - 2.0 %   nRBC 3 (*) 0 - 0 %  COMPREHENSIVE METABOLIC PANEL (CC13)      Result Value Range   Sodium 141  136 - 145 mEq/L   Potassium 3.9  3.5 - 5.1 mEq/L   Chloride 107  98 - 107 mEq/L   CO2 23  22 - 29 mEq/L   Glucose 149 (*) 70 - 99 mg/dl   BUN 78.4  7.0 - 69.6 mg/dL   Creatinine 1.0  0.6 - 1.1 mg/dL   Total Bilirubin 2.95 (*) 0.20 - 1.20 mg/dL   Alkaline Phosphatase 137  40 - 150 U/L   AST 46 (*) 5 - 34 U/L   ALT 13  0 - 55 U/L   Total Protein 5.9 (*) 6.4 - 8.3 g/dL   Albumin 2.9 (*) 3.5 - 5.0 g/dL   Calcium 9.3  8.4 - 28.4 mg/dL    Hgb slightly better, plt down from 85 last week, new elevation in bilirubin, alb noted  Studies/Results: As patient was not seen by MD until after vascular lab closed, we will get LE venous dopplers tomorrow, unless more problems prior, in which case she will need ED evaluation.  CXR ordered with increase in SOB  Medications: I have reviewed the patient's current medications.  Assessment/Plan:  1. Metastatic breast carcinoma involving bone and liver: progression on letrozole after she came off everolimus due to  respiratory complications. She has had a difficult time with low dose taxol given 08-12-12 and that may not be best to try to continue, tho she may well have poor tolerance for any chemo. Consider low dose xeloda, tho she may not be willing to take oral chemo, or weekly taxotere, or qowk gemzar or other. With liver involvement and prior hormonal interventions, I do not know that fulvestrant would be helpful, but could consider that if she declines chemo. Continue zometa, which will be given 6-13 even if chemo has to be held then. I will follow up with her this week after CXR and LE dopplers 2.DNR but support to that point and still  treating. 3.PAC in 4. 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 Rash to IV contrast 8. Multifactorial anemia    Kelli [name] P, MD   08/31/2012, 9:01 PM

## 2012-09-01 ENCOUNTER — Other Ambulatory Visit: Payer: Medicare Other | Admitting: Lab

## 2012-09-01 ENCOUNTER — Telehealth: Payer: Self-pay | Admitting: Oncology

## 2012-09-01 ENCOUNTER — Ambulatory Visit (HOSPITAL_BASED_OUTPATIENT_CLINIC_OR_DEPARTMENT_OTHER): Payer: Medicare Other

## 2012-09-01 ENCOUNTER — Ambulatory Visit (HOSPITAL_COMMUNITY)
Admission: RE | Admit: 2012-09-01 | Discharge: 2012-09-01 | Disposition: A | Discharge status: Home / Self Care | Payer: Medicare Other | Source: Ambulatory Visit | Attending: Oncology | Admitting: Oncology

## 2012-09-01 ENCOUNTER — Other Ambulatory Visit: Payer: Self-pay | Admitting: Oncology

## 2012-09-01 ENCOUNTER — Other Ambulatory Visit: Payer: Self-pay

## 2012-09-01 DIAGNOSIS — C50912 Malignant neoplasm of unspecified site of left female breast: Secondary | ICD-10-CM

## 2012-09-01 DIAGNOSIS — C50419 Malignant neoplasm of upper-outer quadrant of unspecified female breast: Secondary | ICD-10-CM

## 2012-09-01 DIAGNOSIS — R0602 Shortness of breath: Secondary | ICD-10-CM | POA: Insufficient documentation

## 2012-09-01 DIAGNOSIS — C7952 Secondary malignant neoplasm of bone marrow: Secondary | ICD-10-CM | POA: Insufficient documentation

## 2012-09-01 DIAGNOSIS — M7989 Other specified soft tissue disorders: Secondary | ICD-10-CM

## 2012-09-01 DIAGNOSIS — J90 Pleural effusion, not elsewhere classified: Secondary | ICD-10-CM | POA: Insufficient documentation

## 2012-09-01 DIAGNOSIS — C7951 Secondary malignant neoplasm of bone: Secondary | ICD-10-CM | POA: Insufficient documentation

## 2012-09-01 DIAGNOSIS — R002 Palpitations: Secondary | ICD-10-CM | POA: Insufficient documentation

## 2012-09-01 DIAGNOSIS — M8448XA Pathological fracture, other site, initial encounter for fracture: Secondary | ICD-10-CM | POA: Insufficient documentation

## 2012-09-01 DIAGNOSIS — C50919 Malignant neoplasm of unspecified site of unspecified female breast: Secondary | ICD-10-CM

## 2012-09-01 MED ORDER — ZOLEDRONIC ACID 4 MG/100ML IV SOLN
4.0000 mg | Freq: Once | INTRAVENOUS | Status: AC
  Administered 2012-09-01: 4 mg via INTRAVENOUS
  Filled 2012-09-01: qty 100

## 2012-09-01 MED ORDER — LORAZEPAM 1 MG PO TABS: 1.0000 mg | ORAL_TABLET | Freq: Four times a day (QID) | ORAL | Status: DC | PRN

## 2012-09-01 MED ORDER — ZOLEDRONIC ACID 4 MG/5ML IV CONC: 4.0000 mg | Freq: Once | INTRAVENOUS | Status: DC

## 2012-09-01 MED ORDER — HEPARIN SOD (PORK) LOCK FLUSH 100 UNIT/ML IV SOLN
500.0000 [IU] | Freq: Once | INTRAVENOUS | Status: AC | PRN
  Administered 2012-09-01: 500 [IU]
  Filled 2012-09-01: qty 5

## 2012-09-01 MED ORDER — SODIUM CHLORIDE 0.9 % IJ SOLN
10.0000 mL | INTRAMUSCULAR | Status: DC | PRN
  Administered 2012-09-01: 10 mL
  Filled 2012-09-01: qty 10

## 2012-09-01 MED ORDER — FUROSEMIDE 20 MG PO TABS: ORAL_TABLET | ORAL | Status: DC

## 2012-09-01 NOTE — Patient Instructions (Signed)
Zoledronic Acid injection (Hypercalcemia, Oncology) What is this medicine? ZOLEDRONIC ACID (ZOE le dron ik AS id) lowers the amount of calcium loss from bone. It is used to treat too much calcium in your blood from cancer. It is also used to prevent complications of cancer that has spread to the bone. This medicine may be used for other purposes; ask your health care provider or pharmacist if you have questions. What should I tell my health care provider before I take this medicine? They need to know if you have any of these conditions: -aspirin-sensitive asthma -dental disease -kidney disease -an unusual or allergic reaction to zoledronic acid, other medicines, foods, dyes, or preservatives -pregnant or trying to get pregnant -breast-feeding How should I use this medicine? This medicine is for infusion into a vein. It is given by a health care professional in a hospital or clinic setting. Talk to your pediatrician regarding the use of this medicine in children. Special care may be needed. Overdosage: If you think you have taken too much of this medicine contact a poison control center or emergency room at once. NOTE: This medicine is only for you. Do not share this medicine with others. What if I miss a dose? It is important not to miss your dose. Call your doctor or health care professional if you are unable to keep an appointment. What may interact with this medicine? -certain antibiotics given by injection -NSAIDs, medicines for pain and inflammation, like ibuprofen or naproxen -some diuretics like bumetanide, furosemide -teriparatide -thalidomide This list may not describe all possible interactions. Give your health care provider a list of all the medicines, herbs, non-prescription drugs, or dietary supplements you use. Also tell them if you smoke, drink alcohol, or use illegal drugs. Some items may interact with your medicine. What should I watch for while using this medicine? Visit  your doctor or health care professional for regular checkups. It may be some time before you see the benefit from this medicine. Do not stop taking your medicine unless your doctor tells you to. Your doctor may order blood tests or other tests to see how you are doing. Women should inform their doctor if they wish to become pregnant or think they might be pregnant. There is a potential for serious side effects to an unborn child. Talk to your health care professional or pharmacist for more information. You should make sure that you get enough calcium and vitamin D while you are taking this medicine. Discuss the foods you eat and the vitamins you take with your health care professional. Some people who take this medicine have severe bone, joint, and/or muscle pain. This medicine may also increase your risk for a broken thigh bone. Tell your doctor right away if you have pain in your upper leg or groin. Tell your doctor if you have any pain that does not go away or that gets worse. What side effects may I notice from receiving this medicine? Side effects that you should report to your doctor or health care professional as soon as possible: -allergic reactions like skin rash, itching or hives, swelling of the face, lips, or tongue -anxiety, confusion, or depression -breathing problems -changes in vision -feeling faint or lightheaded, falls -jaw burning, cramping, pain -muscle cramps, stiffness, or weakness -trouble passing urine or change in the amount of urine Side effects that usually do not require medical attention (report to your doctor or health care professional if they continue or are bothersome): -bone, joint, or muscle pain -  fever -hair loss -irritation at site where injected -loss of appetite -nausea, vomiting -stomach upset -tired This list may not describe all possible side effects. Call your doctor for medical advice about side effects. You may report side effects to FDA at  1-800-FDA-1088. Where should I keep my medicine? This drug is given in a hospital or clinic and will not be stored at home. NOTE: This sheet is a summary. It may not cover all possible information. If you have questions about this medicine, talk to your doctor, pharmacist, or health care provider.  2012, Elsevier/Gold Standard. (09/06/2010 9:06:58 AM) 

## 2012-09-01 NOTE — Progress Notes (Signed)
VASCULAR LAB PRELIMINARY  PRELIMINARY  PRELIMINARY  PRELIMINARY  Bilateral lower extremity venous duplex completed.    Preliminary report:  Bilateral:  No evidence of DVT, superficial thrombosis, or Baker's Cyst.   Ruthe Roemer, RVS 09/01/2012, 12:15 PM

## 2012-09-01 NOTE — Telephone Encounter (Signed)
Medical Oncology  This MD spoke with vascular lab scheduling now, as no appt yet in EMR for outpatient bilat LE dopplers today. They will contact patient re 11AM apt at [facility] vascular lab today.  Ila Mcgill, MD

## 2012-09-06 ENCOUNTER — Telehealth: Payer: Self-pay | Admitting: Oncology

## 2012-09-06 NOTE — Telephone Encounter (Signed)
added pt appt per pof...comfirmed with pt appt...ok and aware

## 2012-09-07 ENCOUNTER — Ambulatory Visit (HOSPITAL_BASED_OUTPATIENT_CLINIC_OR_DEPARTMENT_OTHER): Payer: Medicare Other | Admitting: Oncology

## 2012-09-07 ENCOUNTER — Encounter: Payer: Self-pay | Admitting: Oncology

## 2012-09-07 ENCOUNTER — Telehealth: Payer: Self-pay | Admitting: Oncology

## 2012-09-07 ENCOUNTER — Other Ambulatory Visit (HOSPITAL_BASED_OUTPATIENT_CLINIC_OR_DEPARTMENT_OTHER): Payer: Medicare Other | Admitting: Lab

## 2012-09-07 VITALS — BP 130/57 | HR 103 | Temp 98.4°F | Resp 17 | Ht 64.0 in | Wt 139.2 lb

## 2012-09-07 DIAGNOSIS — C801 Malignant (primary) neoplasm, unspecified: Secondary | ICD-10-CM

## 2012-09-07 DIAGNOSIS — C7951 Secondary malignant neoplasm of bone: Secondary | ICD-10-CM

## 2012-09-07 DIAGNOSIS — C50919 Malignant neoplasm of unspecified site of unspecified female breast: Secondary | ICD-10-CM

## 2012-09-07 DIAGNOSIS — C50912 Malignant neoplasm of unspecified site of left female breast: Secondary | ICD-10-CM

## 2012-09-07 DIAGNOSIS — C800 Disseminated malignant neoplasm, unspecified: Secondary | ICD-10-CM

## 2012-09-07 LAB — CBC WITH DIFFERENTIAL/PLATELET
Basophils Absolute: 0 10*3/uL (ref 0.0–0.1)
Eosinophils Absolute: 0.2 10*3/uL (ref 0.0–0.5)
HGB: 8.2 g/dL — ABNORMAL LOW (ref 11.6–15.9)
MONO#: 0.5 10*3/uL (ref 0.1–0.9)
NEUT#: 4.8 10*3/uL (ref 1.5–6.5)
RBC: 2.4 10*6/uL — ABNORMAL LOW (ref 3.70–5.45)
RDW: 20.7 % — ABNORMAL HIGH (ref 11.2–14.5)
WBC: 9.5 10*3/uL (ref 3.9–10.3)
lymph#: 4 10*3/uL — ABNORMAL HIGH (ref 0.9–3.3)
nRBC: 2 % — ABNORMAL HIGH (ref 0–0)

## 2012-09-07 LAB — COMPREHENSIVE METABOLIC PANEL (CC13)
ALT: 14 U/L (ref 0–55)
Albumin: 2.8 g/dL — ABNORMAL LOW (ref 3.5–5.0)
CO2: 22 mEq/L (ref 22–29)
Calcium: 8.9 mg/dL (ref 8.4–10.4)
Chloride: 109 mEq/L — ABNORMAL HIGH (ref 98–107)
Glucose: 148 mg/dl — ABNORMAL HIGH (ref 70–99)
Potassium: 4.2 mEq/L (ref 3.5–5.1)
Sodium: 140 mEq/L (ref 136–145)
Total Bilirubin: 1.78 mg/dL — ABNORMAL HIGH (ref 0.20–1.20)
Total Protein: 5.6 g/dL — ABNORMAL LOW (ref 6.4–8.3)

## 2012-09-07 NOTE — Progress Notes (Signed)
OFFICE PROGRESS NOTE   09/07/2012   Physicians:J.Hewitt, J.Kinard, J.Beekman, W.Elkins, M.Altheimer, S.Dahlstedt, C.Ezzard Standing, V. 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 She had first taxol (80 mg/m2) on 08-12-12, tolerated poorly with cytopenias and aching, platelets still low at visit 08-31-12. Bilateral LE venous dopplers done 09-01-12 because of significant swelling did not show clot. Patient continues to feel fatigued, with nausea and difficulty sleeping. Last zometa was 09-01-12. She has PAC.  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 She was treated with lumpectomy and 18 node axillary dissection, adjuvant adriamycin/cytoxan followed by CMF chemotherapy, local radiation, tamoxifen from April 2003 thru Dec 2003 then arimidex from Dec 2004 thru June 2011. The arimidex was continued for that duration due to high risk features, stopped in June 2011 as there was no evidence of disease and with known osteoporosis. She was clinically stable and labs were normal other than very slight decrease in hemoglobin when I saw her in Jan 2013. She developed acute pain in left hip in June 2013, with MRI hip 09-20-11 showing metastatic disease with pathologic fracture of femoral neck, nailing done by Dr Victorino Dike on 09-24-11. Pathology confirmed metastatic breast carcinoma, ER+ 100%, PR negative and HER 2 negative by [facility]. She had left ureteral stent by Dr Retta Diones 11-01-11 for obstructive hydronephrosis. She received radiation 3000 cGy in 10 fractions to left femur 7-24 thru 10-28-11 by Dr Roselind Messier. Bolero 4 study was started 11-12-11. Everolimus dose was reduced due to epistaxis. Restaging scans done per protocol in Dec 2013 (bone scan, CT chest, MRI abdomen and pelvis) all  showed further improvement in metastatic disease, without other progression. Everolimus was discontinued after 03-03-12 due to dyspnea. Respiratory problems were evaluated with VQ, echocardiogram and bubble echo, and PFTs by Dr Craige Cotta, and the SOB progressively improved off of everolimus. She had left ureteral stent changed by Dr Retta Diones on 07-22-2012. CA 2729 had been 354 in July 2013, down to 224 in Dec 2013 when everolimus DCd, then was 299 in March 2014, 448 in April and was up to 639 by labs drawn on 07-27-12. Letrozole was discontinued on 07-28-12 and first taxol given 08-12-12 (80 mg/m2), not tolerated well as noted above. She is also on zometa, last given 09-01-12.  No increased SOB or cough. No new or different pain. Bowels moving. No bleeding. Swelling in legs some better in AMs. Remainder of 10 point Review of Systems negative/ unchanged.  Objective:  Vital signs in last 24 hours:  BP 130/57  Pulse 103  Temp(Src) 98.4 F (36.9 C) (Oral)  Resp 17  Ht 5\' 4"  (1.626 m)  Wt 139 lb 3.2 oz (63.141 kg)  BMI 23.88 kg/m2 Weight is up 1 lb. Ambulatory, able to get on and off exam table with minimal assistance. Alert and appropriate   HEENT:PERRLA, sclera clear, anicteric, oropharynx clear, no lesions and neck supple with midline trachea LymphaticsCervical, supraclavicular, and axillary nodes normal. Resp: clear to auscultation bilaterally and normal percussion bilaterally Cardio: regular rate and rhythm GI: soft, not distended, some BS, no rub over liver. Extremities: 2+ swelling LE without erythema, cords Neuro:nonfocal Skin without rash or petechiae Portacath without erythema or tenderness Lab Results:  Results for orders placed in visit on 09/07/12  CBC WITH DIFFERENTIAL  Result Value Range   WBC 9.5  3.9 - 10.3 10e3/uL   NEUT# 4.8  1.5 - 6.5 10e3/uL   HGB 8.2 (*) 11.6 - 15.9 g/dL   HCT 16.1 (*) 09.6 - 04.5 %   Platelets 87 (*) 145 - 400 10e3/uL   MCV 106.3 (*) 79.5 - 101.0 fL    MCH 34.2 (*) 25.1 - 34.0 pg   MCHC 32.2  31.5 - 36.0 g/dL   RBC 4.09 (*) 8.11 - 9.14 10e6/uL   RDW 20.7 (*) 11.2 - 14.5 %   lymph# 4.0 (*) 0.9 - 3.3 10e3/uL   MONO# 0.5  0.1 - 0.9 10e3/uL   Eosinophils Absolute 0.2  0.0 - 0.5 10e3/uL   Basophils Absolute 0.0  0.0 - 0.1 10e3/uL   NEUT% 50.2  38.4 - 76.8 %   LYMPH% 42.2  14.0 - 49.7 %   MONO% 5.7  0.0 - 14.0 %   EOS% 1.6  0.0 - 7.0 %   BASO% 0.3  0.0 - 2.0 %   nRBC 2 (*) 0 - 0 %  COMPREHENSIVE METABOLIC PANEL (CC13)      Result Value Range   Sodium 140  136 - 145 mEq/L   Potassium 4.2  3.5 - 5.1 mEq/L   Chloride 109 (*) 98 - 107 mEq/L   CO2 22  22 - 29 mEq/L   Glucose 148 (*) 70 - 99 mg/dl   BUN 78.2  7.0 - 95.6 mg/dL   Creatinine 0.9  0.6 - 1.1 mg/dL   Total Bilirubin 2.13 (*) 0.20 - 1.20 mg/dL   Alkaline Phosphatase 122  40 - 150 U/L   AST 44 (*) 5 - 34 U/L   ALT 14  0 - 55 U/L   Total Protein 5.6 (*) 6.4 - 8.3 g/dL   Albumin 2.8 (*) 3.5 - 5.0 g/dL   Calcium 8.9  8.4 - 08.6 mg/dL     Studies/Results: CHEST - 2 VIEW 09-01-12 Comparison: 04/09/2012 and earlier.  Findings: Right chest IJ approach Port-A-Cath in place. Catheter  tip at the level of the lower SVC below the carina. Partially  visible left double-J ureteral stent, visualized portions stable.  Small bilateral pleural effusions are new or increased. Cardiac  size and mediastinal contours are within normal limits.  Postoperative changes to the chest wall and left axilla. No  pneumothorax or pulmonary edema. No consolidation. Stable mid  thoracic mild compression fractures. Stable heterogeneous bone  mineralization compatible with known osseous metastatic disease.  IMPRESSION:  1. New or increased small bilateral pleural effusions.  2. Right chest Port-A-Cath in place with no adverse features  evident.  3. Left double-J ureteral stent partially re-identified    LE venous doppler as above  Medications: I have reviewed the patient's current  medications.   We have discussed options for care, including comfort measures only, which could include Hospice; Faslodex, tho IM injections with present thrombocytopenia could cause bleeding or hematomas and she has already progressed thru other hormonal maneuvers, tho otherwise this would probably be easiest intervention; other chemotherapy if platelets continue to improve to allow this, with mention of even lower dose taxane or xeloda (I am not sure that she would take oral meds). I have emphasized that none of the chemo will be without side effects. She and husband will consider this information and I will see her again next week with repeat counts and further discussion.  Following visit, I have thought Megace might be reasonable if thrombocytopenia remains  problematic.  Assessment/Plan:  1. Metastatic breast carcinoma involving bone and liver: progression on letrozole after she came off everolimus due to respiratory complications.Options limited, particularly as she has such poor tolerance for any side effects and with platelets still low, apparently from single low dose taxol. Continue zometa.   2.DNR but support to that point and still treating.  3.PAC in  4. 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 Rash to IV contrast  Patient knows to call if needed prior to next scheduled visit.  Reece Packer, MD   09/07/2012, 7:44 PM

## 2012-09-07 NOTE — Telephone Encounter (Signed)
Gave pt appt for lab and MD on June 2014  °

## 2012-09-14 ENCOUNTER — Ambulatory Visit (HOSPITAL_BASED_OUTPATIENT_CLINIC_OR_DEPARTMENT_OTHER): Payer: Medicare Other | Admitting: Oncology

## 2012-09-14 ENCOUNTER — Ambulatory Visit (HOSPITAL_BASED_OUTPATIENT_CLINIC_OR_DEPARTMENT_OTHER): Payer: Medicare Other

## 2012-09-14 ENCOUNTER — Other Ambulatory Visit (HOSPITAL_BASED_OUTPATIENT_CLINIC_OR_DEPARTMENT_OTHER): Payer: Medicare Other | Admitting: Lab

## 2012-09-14 ENCOUNTER — Telehealth: Payer: Self-pay | Admitting: *Deleted

## 2012-09-14 VITALS — BP 138/62 | HR 98 | Temp 98.2°F | Resp 17 | Ht 64.0 in | Wt 142.2 lb

## 2012-09-14 DIAGNOSIS — C50919 Malignant neoplasm of unspecified site of unspecified female breast: Secondary | ICD-10-CM

## 2012-09-14 DIAGNOSIS — C50419 Malignant neoplasm of upper-outer quadrant of unspecified female breast: Secondary | ICD-10-CM

## 2012-09-14 DIAGNOSIS — C800 Disseminated malignant neoplasm, unspecified: Secondary | ICD-10-CM

## 2012-09-14 DIAGNOSIS — Z5111 Encounter for antineoplastic chemotherapy: Secondary | ICD-10-CM

## 2012-09-14 DIAGNOSIS — C7951 Secondary malignant neoplasm of bone: Secondary | ICD-10-CM

## 2012-09-14 DIAGNOSIS — C50912 Malignant neoplasm of unspecified site of left female breast: Secondary | ICD-10-CM

## 2012-09-14 DIAGNOSIS — C787 Secondary malignant neoplasm of liver and intrahepatic bile duct: Secondary | ICD-10-CM

## 2012-09-14 LAB — CBC WITH DIFFERENTIAL/PLATELET
BASO%: 1.1 % (ref 0.0–2.0)
LYMPH%: 42.2 % (ref 14.0–49.7)
MCHC: 33.3 g/dL (ref 31.5–36.0)
MONO#: 0.6 10*3/uL (ref 0.1–0.9)
MONO%: 8.5 % (ref 0.0–14.0)
Platelets: 108 10*3/uL — ABNORMAL LOW (ref 145–400)
RBC: 2.38 10*6/uL — ABNORMAL LOW (ref 3.70–5.45)
RDW: 23.1 % — ABNORMAL HIGH (ref 11.2–14.5)
WBC: 7.6 10*3/uL (ref 3.9–10.3)

## 2012-09-14 MED ORDER — FULVESTRANT 250 MG/5ML IM SOLN
500.0000 mg | Freq: Once | INTRAMUSCULAR | Status: AC
  Administered 2012-09-14: 500 mg via INTRAMUSCULAR
  Filled 2012-09-14: qty 10

## 2012-09-14 NOTE — Patient Instructions (Addendum)
Fulvestrant injection What is this medicine? FULVESTRANT (ful VES trant) blocks the effects of estrogen. It is used to treat breast cancer in women past the age of menopause. This medicine may be used for other purposes; ask your health care provider or pharmacist if you have questions. What should I tell my health care provider before I take this medicine? They need to know if you have any of these conditions: -bleeding problems -liver disease -low levels of platelets in the blood -an unusual or allergic reaction to fulvestrant, other medicines, foods, dyes, or preservatives -pregnant or trying to get pregnant -breast-feeding How should I use this medicine? This medicine is for injection into a muscle. It is usually given by a health care professional in a hospital or clinic setting. Talk to your pediatrician regarding the use of this medicine in children. Special care may be needed. Overdosage: If you think you have taken too much of this medicine contact a poison control center or emergency room at once. NOTE: This medicine is only for you. Do not share this medicine with others. What if I miss a dose? It is important not to miss your dose. Call your doctor or health care professional if you are unable to keep an appointment. What may interact with this medicine? -medicines that treat or prevent blood clots like warfarin, enoxaparin, and dalteparin This list may not describe all possible interactions. Give your health care provider a list of all the medicines, herbs, non-prescription drugs, or dietary supplements you use. Also tell them if you smoke, drink alcohol, or use illegal drugs. Some items may interact with your medicine. What should I watch for while using this medicine? Your condition will be monitored carefully while you are receiving this medicine. You will need important blood work done while you are taking this medicine. Do not become pregnant while taking this medicine.  Women should inform their doctor if they wish to become pregnant or think they might be pregnant. There is a potential for serious side effects to an unborn child. Talk to your health care professional or pharmacist for more information. What side effects may I notice from receiving this medicine? Side effects that you should report to your doctor or health care professional as soon as possible: -allergic reactions like skin rash, itching or hives, swelling of the face, lips, or tongue -feeling faint or lightheaded, falls -fever or flu-like symptoms -sore throat -vaginal bleeding Side effects that usually do not require medical attention (report to your doctor or health care professional if they continue or are bothersome): -aches, pains -constipation or diarrhea -headache -hot flashes -nausea, vomiting -pain at site where injected -stomach pain This list may not describe all possible side effects. Call your doctor for medical advice about side effects. You may report side effects to FDA at 1-800-FDA-1088. Where should I keep my medicine? This drug is given in a hospital or clinic and will not be stored at home. NOTE: This sheet is a summary. It may not cover all possible information. If you have questions about this medicine, talk to your doctor, pharmacist, or health care provider.  2013, Elsevier/Gold Standard. (07/19/2007 3:39:24 PM)  

## 2012-09-14 NOTE — Telephone Encounter (Signed)
Per staff phone call and POF I have schedueld appts.  JMW  

## 2012-09-15 ENCOUNTER — Telehealth: Payer: Self-pay | Admitting: Oncology

## 2012-09-16 NOTE — Progress Notes (Signed)
OFFICE PROGRESS NOTE   09/14/2012  Physicians:J.Hewitt, J.Kinard, J.Beekman, W.Elkins, M.Altheimer, S.Dahlstedt, C.Ezzard Standing, V. 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 She had first taxol (80 mg/m2) on 08-12-12, tolerated poorly. Platelets have just recovered over 100k today. Visit today is to make decision re further treatment. She will transfer medical oncology to Dr Welton Flakes as she is still on follow up for the [facility] 4 trial. She has PAC, flushed 09-01-12.   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 She was treated with lumpectomy and 18 node axillary dissection, adjuvant adriamycin/cytoxan followed by [facility] chemotherapy, local radiation, tamoxifen from April 2003 thru Dec 2003 then arimidex from Dec 2004 thru June 2011. The arimidex was continued for that duration due to high risk features, stopped in June 2011 as there was no evidence of disease and with known osteoporosis. She was clinically stable and labs were normal other than very slight decrease in hemoglobin when I saw her in Jan 2013. She developed acute pain in left hip in June 2013, with MRI hip 09-20-11 showing metastatic disease with pathologic fracture of femoral neck, nailing done by Dr Victorino Dike on 09-24-11. Pathology confirmed metastatic breast carcinoma, ER+ 100%, PR negative and HER 2 negative by [facility]. She had left ureteral stent by Dr Retta Diones 11-01-11 for obstructive hydronephrosis. She received radiation 3000 cGy in 10 fractions to left femur 7-24 thru 10-28-11 by Dr Roselind Messier. Bolero 4 study was started 11-12-11. Everolimus dose was reduced due to epistaxis. Restaging scans done per protocol in Dec 2013 (bone scan, CT chest, MRI abdomen and pelvis) all showed further improvement in metastatic  disease, without other progression. Everolimus was discontinued after 03-03-12 due to dyspnea. Respiratory problems were evaluated with VQ, echocardiogram and bubble echo, and PFTs by Dr Craige Cotta, and the SOB progressively improved off of everolimus. She had left ureteral stent changed by Dr Retta Diones on 07-22-2012. CA 2729 had been 354 in July 2013, down to 224 in Dec 2013 when everolimus DCd, then was 299 in March 2014, 448 in April and was up to 639 by labs drawn on 07-27-12. Letrozole was discontinued on 07-28-12 and first taxol given 08-12-12 (80 mg/m2), not tolerated well as noted above. She is also on zometa, last given 09-01-12.   Patient remains fatigued, worse due to chronic insomnia, but appetite is better and she has planted some flowers. She complains of "stomach queasy", which is actually lower abdominal vague discomfort, not nausea and not epigastric location. No increased pain in back or LE. No bleeding.  Remainder of 10 point Review of Systems negative.  Objective:  Vital signs in last 24 hours:  BP 138/62  Pulse 98  Temp(Src) 98.2 F (36.8 C) (Oral)  Resp 17  Ht 5\' 4"  (1.626 m)  Wt 142 lb 3.2 oz (64.501 kg)  BMI 24.4 kg/m2  SpO2 94%  Ambulatory, looks more comfortable and more cheerful today. Husband very supportive as always. Respirations not labored RA. Partial alopecia  HEENT:PERRLA, extra ocular movement intact, sclera clear, anicteric and oropharynx clear, no lesions LymphaticsCervical, supraclavicular, and axillary nodes normal. Resp: clear to auscultation bilaterally and normal percussion bilaterally Cardio: regular rate and rhythm GI: soft, normal bowel sounds, not tender epigastrium, full RUQ without rub or tenderness Extremities: trace pedal edema, no cords or  tenderness Neuro: nonfocal Breast: left with lumpectomy scar, no dominant mass or skin/ nipple findings. Right without dominant mass, skin or nipple findings. Portacath -without erythema or tenderness.  Lab  Results:  Results for orders placed in visit on 09/14/12  CBC WITH DIFFERENTIAL      Result Value Range   WBC 7.6  3.9 - 10.3 10e3/uL   NEUT# 3.5  1.5 - 6.5 10e3/uL   HGB 8.6 (*) 11.6 - 15.9 g/dL   HCT 16.1 (*) 09.6 - 04.5 %   Platelets 108 (*) 145 - 400 10e3/uL   MCV 108.1 (*) 79.5 - 101.0 fL   MCH 36.0 (*) 25.1 - 34.0 pg   MCHC 33.3  31.5 - 36.0 g/dL   RBC 4.09 (*) 8.11 - 9.14 10e6/uL   RDW 23.1 (*) 11.2 - 14.5 %   lymph# 3.2  0.9 - 3.3 10e3/uL   MONO# 0.6  0.1 - 0.9 10e3/uL   Eosinophils Absolute 0.2  0.0 - 0.5 10e3/uL   Basophils Absolute 0.1  0.0 - 0.1 10e3/uL   NEUT% 45.8  38.4 - 76.8 %   LYMPH% 42.2  14.0 - 49.7 %   MONO% 8.5  0.0 - 14.0 %   EOS% 2.4  0.0 - 7.0 %   BASO% 1.1  0.0 - 2.0 %     Last CMET 09-07-12 had AST down to 44 (55 prior to taxol, then up to 84 just after taxol), T prot 5.6, alb 2.8, T bili 1.7  Last Ca 2729 on 08-31-12 was 733  Studies/Results:  No results found.  Medications: I have reviewed the patient's current medications.  We have discussed options for systemic treatment, given very poor tolerance of single dose of 80 mg/m2 taxol, including neutropenia and very slow recovery of platelets. She and husband understand that chemotherapy is generally first choice for involvement of vital organs such as liver, however her disease has also been very responsive to hormonal maneuvers which certainly she should tolerate better than chemo. With platelet count just > 100 today, I am comfortable with Faslodex IM injections, and she would like to begin this therapy. She will return to clinic in 2 weeks for second injections as well as zometa. Appointments will be set up to Dr Welton Flakes from here (thank you!). Patient and husband have had teaching previously for the Faslodex.  Assessment/Plan:  1. Metastatic breast carcinoma involving bone and liver: progression on letrozole after she came off everolimus due to respiratory complications.Begin Faslodex today. Continue  zometa. Note she has very poor tolerance for most treatments and interventions. 2.DNR but support to that point and still treating.  3.PAC in  4. 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 Rash to IV contrast: will need steroid premeds for contrast CTs. Note she was followed with combination of CTs and MRI with Bolero 4 study.   Patient and husband have had questions answered to their satisfaction and are in agreement with plan.   Reece Packer, MD

## 2012-09-17 ENCOUNTER — Telehealth: Payer: Self-pay | Admitting: Oncology

## 2012-09-17 ENCOUNTER — Telehealth: Payer: Self-pay | Admitting: *Deleted

## 2012-09-17 ENCOUNTER — Other Ambulatory Visit: Payer: Self-pay

## 2012-09-17 DIAGNOSIS — C7951 Secondary malignant neoplasm of bone: Secondary | ICD-10-CM

## 2012-09-17 MED ORDER — HYDROMORPHONE HCL 2 MG PO TABS: 2.0000 mg | ORAL_TABLET | ORAL | Status: DC | PRN

## 2012-09-17 NOTE — Telephone Encounter (Signed)
Called patient to follow up on her pain situation, since she has been doing well with hydrocodone-apap recently. She denies any pain crisis. She requests refill on the hydromorphone to have in case of pain crisis-concerned that her new physician will not be willing to provide this for her. Will make Dr. Darrold Span aware of request.

## 2012-09-17 NOTE — Telephone Encounter (Signed)
Prescription for Dilaudid 2 mg tabs  Up front with injection nurse for patient to pick up.  Pt. wants this med to be on hand if she gets into a pain crisis.  Dr. Darrold Span ordered 10 tabs since she has not needed them recently.

## 2012-09-17 NOTE — Telephone Encounter (Signed)
Received fax requesting refill for #30 hydromorphone. Last filled was 09/26/2011.

## 2012-09-19 ENCOUNTER — Encounter: Payer: Self-pay | Admitting: Oncology

## 2012-09-21 ENCOUNTER — Other Ambulatory Visit: Payer: Self-pay | Admitting: Oncology

## 2012-09-21 ENCOUNTER — Ambulatory Visit (HOSPITAL_COMMUNITY)
Admission: RE | Admit: 2012-09-21 | Discharge: 2012-09-21 | Disposition: A | Discharge status: Home / Self Care | Payer: Medicare Other | Source: Ambulatory Visit | Attending: Oncology | Admitting: Oncology

## 2012-09-21 DIAGNOSIS — C50912 Malignant neoplasm of unspecified site of left female breast: Secondary | ICD-10-CM

## 2012-09-21 DIAGNOSIS — R109 Unspecified abdominal pain: Secondary | ICD-10-CM | POA: Insufficient documentation

## 2012-09-21 DIAGNOSIS — N1330 Unspecified hydronephrosis: Secondary | ICD-10-CM | POA: Insufficient documentation

## 2012-09-21 DIAGNOSIS — C801 Malignant (primary) neoplasm, unspecified: Secondary | ICD-10-CM | POA: Insufficient documentation

## 2012-09-21 DIAGNOSIS — J90 Pleural effusion, not elsewhere classified: Secondary | ICD-10-CM | POA: Insufficient documentation

## 2012-09-21 DIAGNOSIS — R188 Other ascites: Secondary | ICD-10-CM | POA: Insufficient documentation

## 2012-09-21 DIAGNOSIS — Z9089 Acquired absence of other organs: Secondary | ICD-10-CM | POA: Insufficient documentation

## 2012-09-21 DIAGNOSIS — C50919 Malignant neoplasm of unspecified site of unspecified female breast: Secondary | ICD-10-CM | POA: Insufficient documentation

## 2012-09-23 ENCOUNTER — Telehealth: Payer: Self-pay

## 2012-09-23 NOTE — Telephone Encounter (Signed)
Message copied by Lorine Bears on Thu Sep 23, 2012 11:42 AM ------      Message from: Reece Packer      Created: Tue Sep 21, 2012  8:48 PM       Labs seen and need follow up: please let her know that the US shows just 2 areas in the liver. It is not as detailed of an exam as MRI, but nothing looks obviously worse on this. How did she do with the Faslodex shots?      Cc LA, TH ------

## 2012-09-23 NOTE — Telephone Encounter (Signed)
Discussed [place] results as noted below by Dr. Darrold Span. Kelli [name]  Stated that she did fine after the Faslodex injections. She cc of abd. being hard and distended.  She states that she has not had very good BM recently.  Suggested that she get Miralax  And take a capful when she gets it and repeat in ~3 hours if no good evacuation.  She may want to use a 1/2 a capful daily to keep bowels moving daily.  She has ducolax laxitive /stool softeners at home .  Suggested that she take one daily to twice daily once she has a good BM. Kelli [name] verbalized understanding.

## 2012-09-27 ENCOUNTER — Encounter: Payer: Self-pay | Admitting: Adult Health

## 2012-09-27 ENCOUNTER — Other Ambulatory Visit (HOSPITAL_BASED_OUTPATIENT_CLINIC_OR_DEPARTMENT_OTHER): Payer: Medicare Other | Admitting: Lab

## 2012-09-27 ENCOUNTER — Telehealth: Payer: Self-pay | Admitting: Medical Oncology

## 2012-09-27 ENCOUNTER — Ambulatory Visit (HOSPITAL_COMMUNITY)
Admission: RE | Admit: 2012-09-27 | Discharge: 2012-09-27 | Disposition: A | Discharge status: Home / Self Care | Payer: Medicare Other | Source: Ambulatory Visit | Attending: Adult Health | Admitting: Adult Health

## 2012-09-27 ENCOUNTER — Ambulatory Visit (HOSPITAL_BASED_OUTPATIENT_CLINIC_OR_DEPARTMENT_OTHER): Payer: Medicare Other | Admitting: Adult Health

## 2012-09-27 ENCOUNTER — Telehealth: Payer: Self-pay | Admitting: *Deleted

## 2012-09-27 VITALS — BP 111/69 | HR 109 | Temp 98.2°F | Resp 20 | Ht 64.0 in | Wt 149.6 lb

## 2012-09-27 DIAGNOSIS — C50919 Malignant neoplasm of unspecified site of unspecified female breast: Secondary | ICD-10-CM

## 2012-09-27 DIAGNOSIS — R143 Flatulence: Secondary | ICD-10-CM | POA: Insufficient documentation

## 2012-09-27 DIAGNOSIS — C50911 Malignant neoplasm of unspecified site of right female breast: Secondary | ICD-10-CM

## 2012-09-27 DIAGNOSIS — R141 Gas pain: Secondary | ICD-10-CM | POA: Insufficient documentation

## 2012-09-27 DIAGNOSIS — K5900 Constipation, unspecified: Secondary | ICD-10-CM

## 2012-09-27 DIAGNOSIS — C800 Disseminated malignant neoplasm, unspecified: Secondary | ICD-10-CM

## 2012-09-27 DIAGNOSIS — R142 Eructation: Secondary | ICD-10-CM | POA: Insufficient documentation

## 2012-09-27 DIAGNOSIS — C50912 Malignant neoplasm of unspecified site of left female breast: Secondary | ICD-10-CM

## 2012-09-27 DIAGNOSIS — R188 Other ascites: Secondary | ICD-10-CM

## 2012-09-27 DIAGNOSIS — R358 Other polyuria: Secondary | ICD-10-CM

## 2012-09-27 DIAGNOSIS — J90 Pleural effusion, not elsewhere classified: Secondary | ICD-10-CM | POA: Insufficient documentation

## 2012-09-27 LAB — COMPREHENSIVE METABOLIC PANEL (CC13)
AST: 48 U/L — ABNORMAL HIGH (ref 5–34)
Alkaline Phosphatase: 98 U/L (ref 40–150)
BUN: 16 mg/dL (ref 7.0–26.0)
Creatinine: 0.9 mg/dL (ref 0.6–1.1)
Glucose: 125 mg/dl (ref 70–140)

## 2012-09-27 LAB — CBC WITH DIFFERENTIAL/PLATELET
Basophils Absolute: 0.1 10*3/uL (ref 0.0–0.1)
EOS%: 1.2 % (ref 0.0–7.0)
Eosinophils Absolute: 0.1 10*3/uL (ref 0.0–0.5)
HCT: 28.1 % — ABNORMAL LOW (ref 34.8–46.6)
HGB: 9.4 g/dL — ABNORMAL LOW (ref 11.6–15.9)
LYMPH%: 30.9 % (ref 14.0–49.7)
MCH: 35.5 pg — ABNORMAL HIGH (ref 25.1–34.0)
MCV: 106.4 fL — ABNORMAL HIGH (ref 79.5–101.0)
MONO%: 10.2 % (ref 0.0–14.0)
NEUT%: 56.8 % (ref 38.4–76.8)
Platelets: 94 10*3/uL — ABNORMAL LOW (ref 145–400)

## 2012-09-27 MED ORDER — LACTULOSE 10 GM/15ML PO SOLN: 10.0000 g | ORAL | Status: DC

## 2012-09-27 MED ORDER — POTASSIUM CHLORIDE CRYS ER 20 MEQ PO TBCR: 20.0000 meq | EXTENDED_RELEASE_TABLET | Freq: Two times a day (BID) | ORAL | Status: DC

## 2012-09-27 NOTE — Telephone Encounter (Signed)
appts made and printed. Pt is going today for her xray of adb, and shes aware that cs will call w/ appt d/t for the [place]...td

## 2012-09-27 NOTE — Telephone Encounter (Signed)
Received call this morning from [facility] in research regarding patient. States Kelli [name], patient's spouse called her not knowing who else to call r/t patient having abdominal pain d/t no bowel movement despite use of miralax and stool softtners.  F/U call with Kelli [name], he states patient's abdomen "rock hard, she's very weak, refuses to get out of bed and is drinking very little water and she's having a lot of pain." Denies fever. States last bowel movement was "a week ago." Reviewed with MD. Patient to see Augustin Schooling, NP today with labs. Abdominal US on 09/21/12.  Informed spouse patient scheduled for labs today @ 1:15 and NP at 1:45. He verbalized understanding, plans to be here at scheduled time.

## 2012-09-27 NOTE — Patient Instructions (Addendum)
Doing well.  We will order an ultrasound and paracentesis if needed.  We will also x ray your belly.  Take Lactulose every 4 hours until you have a bowel movement.  Monitor your weight daily at home.  Take lasix, 20mg  twice this week and call me on Friday with your weight.  Take potassium prescribed, one tablet on the days that you take the Lasix.    Lactulose oral solution What is this medicine? LACTULOSE (LAK tyoo lose) is a laxative derived from lactose. It helps to treat chronic constipation and to treat or prevent hepatic encephalopathy or coma. These are brain disorders that result from liver disease. This medicine may be used for other purposes; ask your health care provider or pharmacist if you have questions. What should I tell my health care provider before I take this medicine? They need to know if you have any of these conditions: -need a galactose-free diet -scheduled for surgery -an unusual or allergic reaction to lactulose, other sugars, medicines, foods, dyes or preservatives -pregnant or trying to get pregnant -breast-feeding How should I use this medicine? Take this medicine mouth. Follow the directions on the prescription label. Shake well before using. Use a specially marked spoon or container to measure your medicine. Ask your pharmacist if you do not have one. Household spoons are not accurate. Take your doses at regular intervals. Do not take your medicine more often than directed. You may be directed to take this medicine rectally. If so, you must follow specific directions from your doctor or healthcare professional. Please contact him or her. Talk to your pediatrician regarding the use of this medicine in children. Special care may be needed. Overdosage: If you think you have taken too much of this medicine contact a poison control center or emergency room at once. NOTE: This medicine is only for you. Do not share this medicine with others. What if I miss a dose? If you  miss a dose, take it as soon as you can. If it is almost time for your next dose, take only that dose. Do not take double or extra doses. What may interact with this medicine? -antacids -neomycin -other laxatives This list may not describe all possible interactions. Give your health care provider a list of all the medicines, herbs, non-prescription drugs, or dietary supplements you use. Also tell them if you smoke, drink alcohol, or use illegal drugs. Some items may interact with your medicine. What should I watch for while using this medicine? This medicine may not produce any result for 24 to 48 hours. Do not take this medicine for longer than directed by your doctor or health care professional. Drink plenty of water with each dose of this medicine. What side effects may I notice from receiving this medicine? Side effects that you should report to your doctor or health care professional as soon as possible: -diarrhea Side effects that usually do not require medical attention (report to your doctor or health care professional if they continue or are bothersome): -belching, flatulence -nausea or vomiting -stomach pain or discomfort This list may not describe all possible side effects. Call your doctor for medical advice about side effects. You may report side effects to FDA at 1-800-FDA-1088. Where should I keep my medicine? Keep out of the reach of children. This medicine may darken in color under normal storage conditions. This is because of the sugar solution and does not affect the way the medicine works. If the solution becomes extremely dark in color,  contact your health care professional before use. Store at room temperature between 15 and 30 degrees C (59 and 86 degrees F). Do not freeze. Keep container tightly closed. Throw away any unused medicine after the expiration date. NOTE: This sheet is a summary. It may not cover all possible information. If you have questions about this  medicine, talk to your doctor, pharmacist, or health care provider.  2012, [employer] Standard. (09/14/2007 4:04:57 PM)Paracentesis Paracentesis is a procedure used to remove excess fluid from the belly (abdomen). Excess fluid in the belly is called ascites. Excess fluid can be the result of certain conditions, such as infection, inflammation, abdominal injury, heart failure, chronic scarring of the liver (cirrhosis), or cancer. The excess fluid is removed using a needle inserted through the skin and tissue into the abdomen.  A paracentesis may be done to:  Determine the cause of the excess fluid through examination of the fluid.  Relieve symptoms of shortness of breath or pain caused by the excess fluid.  Determine presence of bleeding after an abdominal injury. LET YOUR CAREGIVERS KNOW ABOUT:  Allergies.  Medications taken including herbs, eye drops, over-the-counter medications, and creams.  Use of steroids (by mouth or creams).  Previous problems with anesthetics or numbing medicine.  Possibility of pregnancy, if this applies.  History of blood clots (thrombophlebitis).  History of bleeding or blood problems.  Previous surgery.  Other health problems. RISKS AND COMPLICATIONS  Injury to an abdominal organ, such as the bowel (large intestine), liver, spleen, or bladder.  Possible infection.  Bleeding.  Low blood pressure (hypotension). BEFORE THE PROCEDURE This is a procedure that can be done as an outpatient. Confirm the time that you need to arrive for your procedure. A blood sample may be done to determine your blood clotting time. The presence of a severe bleeding disorder (coagulopathy) which cannot be promptly corrected may make this procedure inadvisable. You may be asked to urinate. PROCEDURE The procedure will take about 30 minutes. This time will vary depending on the amount of fluid that is removed. You may be asked to lie on your back with your head elevated.  An area on your abdomen will be cleansed. A numbing medicine may then be injected (local anesthesia) into the skin and tissue. A needle is inserted through your abdominal skin and tissues until it is positioned in your abdomen. You may feel pressure or slight pain as the needle is positioned into the abdomen. Fluid is removed from the abdomen through the needle. Tell your caregiver if you feel dizzy or lightheaded. The needle is withdrawn once the desired amount of fluid has been removed. A sample of the fluid may be sent for examination.  AFTER THE PROCEDURE Your recovery will be assessed and monitored. If there are no problems, as an outpatient, you should be able to go home shortly after the procedure. There may be a very limited amount of clear fluid draining from the needle insertion site over the next 2 days. Confirm with your caregiver as to the expected amount of drainage. Obtaining the Test Results It is your responsibility to obtain your test results. Do not assume everything is normal if you have not heard from your caregiver or the medical facility. It is important for you to follow up on all of your test results. HOME CARE INSTRUCTIONS   You may resume normal diet and activities as directed or allowed.  Only take over-the-counter or prescription medicines for pain, discomfort, or fever as directed by  your caregiver. SEEK IMMEDIATE MEDICAL CARE IF:  You develop shortness of breath or chest pain.  You develop increasing pain, discomfort, or swelling in your abdomen.  You develop new drainage or pus coming from site where fluid was removed.  You develop swelling or increased redness from site where fluid was removed.  You develop an unexplained temperature of 102 F (38.9 C) or above. Document Released: 09/23/2004 Document Revised: 06/02/2011 Document Reviewed: 10/30/2008 [facility] Patient Information 2014 [place], [place].

## 2012-09-27 NOTE — Progress Notes (Deleted)
OFFICE PROGRESS NOTE  CC**  [name], MD [address] [place] [address]  DIAGNOSIS: ***  PRIOR THERAPY:***  CURRENT THERAPY:***  INTERVAL HISTORY: Kelli [name] 72 y.o. female returns for abdominal pain that has been going on for four months, and has been progressing over the past 2-3 weeks.  When it started she was placed on lasix, 10mg  2x a week, and hasn't noticed a big difference.  The pain is all over her abdomen.  The pain is constant.  She takes Hydrocodone for it, and takes 1/2 dose four times a day, she also takes Ativan if needed.  Her abdomen is swollen and she is having difficulty with bending/moving.  She has had low grade fevers, most recently a few days ago was 100.3.  She is mildly nauseated.  It has been over one week since her last bowel movement.  She is taking Dulcolax, Colace, and Miralax, however, isn't using them daily.  She fell last week getting out of bed and lost her balance and hit the floor.  She didn't lose consciousness and didn't hit her head either.  She has a difficult time moving with the extra weight.    MEDICAL HISTORY: Past Medical History  Diagnosis Date  . Thyroid disease IN 1982    HX IODINE-131 ABLATION FOR HYPERTHYROIDISM  . Osteoporosis   . Vitamin D insufficiency   . Chronic fatigue   . Depression   . Anxiety   . Insomnia   . History of chemotherapy     Adriamycin/cytoxan followed by [facility]  08/2000/tamoxifen 06/2001-02/2003,then arimidex 02/2003-08/2009  . 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 History of radiation therapy 12/28/00-02/12/01    left breast  With Dr. Jamie Kato  . Hypothyroidism   . Fatigue   . Short of breath on exertion   . Breast cancer, left JUNE OF 2002    LEFT BREAST, ER/PR +, HER 2 -  . 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 Metastatic cancer to liver 09/24/11    CURRENTLY RECEIVING RADIATION  . Radiation 10/15/11-10/28/2011    left proximal leg  .  Hydronephrosis, left     SECONDARY TO RETROPERITONEAL METS    ALLERGIES:  is allergic to cephalexin; contrast media; other; and sulfa antibiotics.  MEDICATIONS:  Current Outpatient Prescriptions  Medication Sig Dispense Refill  . diclofenac sodium (VOLTAREN) 1 % GEL Apply 2 g topically 2 (two) times daily as needed (for pain). Apply to knee      . FLUoxetine (PROZAC) 20 MG capsule Take 20 mg by mouth daily at 12 noon.      . furosemide (LASIX) 20 MG tablet TtAKE A 1/2 TABLET = 10 MG 2 X WEEKLY, in am  AS DIRECTED.  Fluid Pill  6 tablet  0  . HYDROcodone-acetaminophen (NORCO/VICODIN) 5-325 MG per tablet Take 1 tablet by mouth every 6 (six) hours as needed for pain.  40 tablet  0  . HYDROmorphone (DILAUDID) 2 MG tablet Take 1 tablet (2 mg total) by mouth every 4 (four) hours as needed for pain.  10 tablet  0  . levothyroxine (SYNTHROID, LEVOTHROID) 75 MCG tablet Take 75 mcg by mouth every morning.       . lidocaine-prilocaine (EMLA) cream Apply topically as needed. Apply to PortaCath site as directed 2 hours prior to access.  30 g  1  . LORazepam (ATIVAN) 1 MG tablet Take 1 tablet (1 mg total) by mouth every 6 (  six) hours as needed (for nausea or to relax).  40 tablet  1  . ondansetron (ZOFRAN-ODT) 8 MG disintegrating tablet Take 8 mg by mouth every 12 (twelve) hours as needed for nausea.      . pantoprazole (PROTONIX) 40 MG tablet       . polyethylene glycol powder (MIRALAX) powder Take 17 g by mouth as needed.      . Simethicone (GAS-X PO) Take 1 tablet by mouth daily as needed (for gas).       . sodium chloride (OCEAN) 0.65 % nasal spray Place 1 spray into the nose as needed (to keep nasal passages moist). Use every 1-2 hours while awake to keep nasal passages moist.  30 mL  12  . sucralfate (CARAFATE) 1 G tablet Take 1 tablet (1 g total) by mouth 4 (four) times daily -  before meals and at bedtime. Can dissolve in water, for stomach  60 tablet  0  . temazepam (RESTORIL) 15 MG capsule Take 1  capsule (15 mg total) by mouth at bedtime as needed for sleep.  30 capsule  0  . zolpidem (AMBIEN) 5 MG tablet Take 5 mg by mouth at bedtime as needed for sleep.      Marland Kitchen dexamethasone (DECADRON) 4 MG tablet TAKE FIVE TABS WITH FOOD 12 HOURS AND 6 HOURS PRIOR TO TAXOL.  20 tablet  1   No current facility-administered medications for this visit.    SURGICAL HISTORY:  Past Surgical History  Procedure Laterality Date  . Cholecystectomy  1970  . Femur im nail  09/24/2011     Procedure: INTRAMEDULLARY (IM) NAIL FEMORAL;  Surgeon: Toni Arthurs, MD;  Location: [facility];  Service: Orthopedics;  Laterality: Left;  FEMURAL NECK RX/ INTERTROCHANTERIC PATHOLOGIC FX  . Bone biopsy  09/25/2011    left medullary canal femur reaming=Metastatic Carcinoma  . Hysteroscopy w/d&c  01-11-2003    REMOVAL POLYP  . Left partial mastectomy / sentinel lymph node bx  08-28-2000    LEFT BREAST CANCER  . Cysto/ left retrograde pyleogram/  left ureteral stent placement  10-30-2011  . Cystoscopy w/ ureteral stent placement Left 07/22/2012    Procedure: CYSTOSCOPY WITH STENT REPLACEMENT;  Surgeon: Marcine Matar, MD;  Location: [facility];  Service: Urology;  Laterality: Left;    REVIEW OF SYSTEMS:  {Ros - complete:30496}   HEALTH MAINTENANCE:  Mammogram*** Colonoscopy*** Bone  Scan*** Pap Smear*** Eye Exam*** Vitamin D*** Lipid Panel***  PHYSICAL EXAMINATION: Blood pressure 111/69, pulse 109, temperature 98.2 F (36.8 C), temperature source Oral, resp. rate 20, height 5\' 4"  (1.626 m), weight 149 lb 9.6 oz (67.858 kg). Body mass index is 25.67 kg/(m^2). ECOG PERFORMANCE STATUS: {CHL ONC ECOG [id number]   {physical exam:21449}   LABORATORY DATA: Lab Results  Component Value Date   WBC 10.9* 09/27/2012   HGB 9.4* 09/27/2012   HCT 28.1* 09/27/2012   MCV 106.4* 09/27/2012   PLT 94* 09/27/2012      Chemistry      Component Value Date/Time   NA 140 09/07/2012 1420   NA 141 07/22/2012 0811   K 4.2  09/07/2012 1420   K 3.9 07/22/2012 0811   CL 109* 09/07/2012 1420   CL 102 06/29/2012 1302   CO2 22 09/07/2012 1420   CO2 26 06/29/2012 1302   BUN 10.8 09/07/2012 1420   BUN 14 06/29/2012 1302   CREATININE 0.9 09/07/2012 1420   CREATININE 0.81 06/29/2012 1302      Component Value Date/Time  CALCIUM 8.9 09/07/2012 1420   CALCIUM 10.0 06/29/2012 1302   ALKPHOS 122 09/07/2012 1420   ALKPHOS 99 06/29/2012 1302   AST 44* 09/07/2012 1420   AST 52* 06/29/2012 1302   ALT 14 09/07/2012 1420   ALT 27 06/29/2012 1302   BILITOT 1.78* 09/07/2012 1420   BILITOT 1.0 06/29/2012 1302       RADIOGRAPHIC STUDIES:  Dg Chest 2 View  09/01/2012   *RADIOLOGY REPORT*  Clinical Data: 72 year old female shortness of breath.  Heart palpitations.  Metastatic breast cancer.  CHEST - 2 VIEW  Comparison: 04/09/2012 and earlier.  Findings: Right chest IJ approach Port-A-Cath in place.  Catheter tip at the level of the lower SVC below the carina.  Partially visible left double-J ureteral stent, visualized portions stable. Small bilateral pleural effusions are new or increased.  Cardiac size and mediastinal contours are within normal limits. Postoperative changes to the chest wall and left axilla.  No pneumothorax or pulmonary edema.  No consolidation.  Stable mid thoracic mild compression fractures.  Stable heterogeneous bone mineralization compatible with known osseous metastatic disease.  IMPRESSION: 1.  New or increased small bilateral pleural effusions. 2.  Right chest Port-A-Cath in place with no adverse features evident. 3.  Left double-J ureteral stent partially re-identified.   Original Report Authenticated By: Erskine Speed, M.D.   US Abdomen Complete  09/21/2012   *RADIOLOGY REPORT*  Clinical Data:  Abdominal pain.  Metastatic breast cancer.  Left renal stent.  Post cholecystectomy  COMPLETE ABDOMINAL ULTRASOUND  Comparison:  Abdominal MRI on 02/27/2012  Findings:  Gallbladder:  Has been surgically removed  Common bile duct:  Has a  diameter 7.2 mm.  This is within acceptable limits for a post cholecystectomy patient  Liver:  Two focal hepatic lesions are identified:  one target lesion in the medial segment of the right lobe of the liver measuring 3.0 x 2.4 x 3.0 cm and a smaller hypoechoic lesion identified in the left lobe of the liver measuring 1.1 x 0.9 x 1.0 cm. This would correspond with the patient's known metastatic disease.  No other discrete focal lesions are seen sonographically but overall hepatic echotexture is heterogeneous.  No intrahepatic ductal dilatation is seen  IVC:  The proximal portion appears normal  Pancreas:  The head and body are normal in size and echotexture. The tail is incompletely assessed  Spleen:  Has a sagittal length of 12.1 cm inhomogeneous pattern with no focal lesions seen  Right Kidney:  Has a sagittal length of 9.9 cm.  No focal parenchymal abnormalities are seen and no signs of hydronephrosis are  Left Kidney:  Has a sagittal length of 10.6 cm.  A moderate degree of dilatation is seen.  A circular structure in the upper portion of the dilated pelvis likely represents the pelvis and of the of patient's indwelling stent. A shadowing echogenic focus in the lower pole calyceal system measuring 7 x 7 mm with posterior twinkle artifact shows no posterior shadowing but is suspicious for a non-obstructing stone.  Abdominal aorta:  Has a caliber of 2.1 cm with no aneurysmal dilatation  Other:  Small bilateral pleural effusions and moderate amount of fluid in the upper abdomen is noted.  IMPRESSION: Two focal areas in the liver sonographically would correlate with the patient's known hepatic metastatic disease, as described above.  Upper abdominal ascites and small bilateral pleural effusions.  Left renal hydronephrosis with probable lower pole nonobstructing calculus.   Original Report Authenticated By: Rhodia Albright,  M.D.    ASSESSMENT: ***   PLAN: ***   All questions were answered. The patient knows  to call the clinic with any problems, questions or concerns. We can certainly see the patient much sooner if necessary.  I spent {CHL ONC TIME VISIT - ZOXWR:[id number]} counseling the patient face to face. The total time spent in the appointment was {CHL ONC TIME VISIT - BJYNW:[id number]}.    Drue Second, MD Medical/Oncology [facility] [phone] (beeper) [phone] (Office)  09/27/2012, 2:13 PM

## 2012-09-27 NOTE — Progress Notes (Signed)
OFFICE PROGRESS NOTE  CC**  [name], MD [address] [place] [address]  DIAGNOSIS: 72 year old female with stage IV invasive ductal carcinoma.  PRIOR THERAPY:  1.  History is of T1N3 invasive ductal left breast cancer diagnosed June 2002 with 13 nodes involved including extracapsular extension, ER + 67%, PR + 39%, HER-2/neu negative. She was treated with lumpectomy and 18 node axillary dissection, adjuvant adriamycin/cytoxan followed by [facility] chemotherapy, local radiation, tamoxifen from April 2003 thru Dec 2003 then arimidex from Dec 2004 thru June 2011. The arimidex was continued for that duration due to high risk features, stopped in June 2011 as there was no evidence of disease and with known osteoporosis.   2.  She developed acute pain in left hip in June 2013, with MRI hip 09-20-11 showing metastatic disease with pathologic fracture of femoral neck, nailing done by Dr Victorino Dike on 09-24-11. Pathology confirmed metastatic breast carcinoma, ER+ 100%, PR negative and HER 2 negative by [facility]. She had left ureteral stent by Dr Retta Diones 11-01-11 for obstructive hydronephrosis.   3.  She received radiation 3000 cGy in 10 fractions to left femur 7-24 thru 10-28-11 by Dr Roselind Messier. Bolero 4 study was started 11-12-11. Everolimus dose was reduced due to epistaxis. Restaging scans done per protocol in Dec 2013 (bone scan, CT chest, MRI abdomen and pelvis) all showed further improvement in metastatic disease, without other progression. Everolimus was discontinued after 03-03-12 due to dyspnea. Respiratory problems were evaluated with VQ, echocardiogram and bubble echo, and PFTs by Dr Craige Cotta, and the SOB progressively improved off of everolimus. She was continued on Letrozole and Zometa.  She had left ureteral stent changed by Dr Retta Diones on 07-22-2012. CA 2729 had been 354 in July 2013, down to 224 in Dec 2013 when everolimus DCd,   4.  CA 27-29 then was 299 in March 2014, 448 in April and was up  to 639 by labs drawn on 07-27-12. Letrozole was discontinued on 07-28-12 and first taxol given 08-12-12 (80 mg/m2), not tolerated well due to cytopenias. She is also on zometa, last given 09-01-12.  5. She was started on Faslodex by Dr. Darrold Span on 09/14/12.    CURRENT THERAPY: Faslodex  INTERVAL HISTORY: Kelli [name] 72 y.o. female  returns for abdominal pain that has been going on for four months, and has been progressing over the past 2-3 weeks.  When it started she was placed on lasix, 10mg  2x a week, and hasn't noticed a big difference.  The pain is all over her abdomen.  The pain is constant.  She takes Hydrocodone for it, and takes 1/2 dose four times a day, she also takes Ativan if needed.  Her abdomen is swollen and she is having difficulty with bending/moving.  She has had low grade fevers, most recently a few days ago was 100.3.  She is mildly nauseated.  It has been over one week since her last bowel movement.  She is taking Dulcolax, Colace, and Miralax, however, isn't using them daily.  She fell last week getting out of bed and lost her balance and hit the floor.  She didn't lose consciousness and didn't hit her head either.  She has a difficult time moving with the extra weight.  Otherwise, a 10 point review of systems is negative.    MEDICAL HISTORY: Past Medical History  Diagnosis Date  . Thyroid disease IN 1982    HX IODINE-131 ABLATION FOR HYPERTHYROIDISM  . Osteoporosis   . Vitamin  D insufficiency   . Chronic fatigue   . Depression   . Anxiety   . Insomnia   . History of chemotherapy     Adriamycin/cytoxan followed by [facility]  08/2000/tamoxifen 06/2001-02/2003,then arimidex 02/2003-08/2009  . 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 History of radiation therapy 12/28/00-02/12/01    left breast  With Dr. Jamie Kato  . Hypothyroidism   . Fatigue   . Short of breath on exertion   . Breast cancer, left JUNE OF 2002    LEFT BREAST, ER/PR +, HER 2 -  . Metastasis to bone 09/24/11     left femur=bone Metastatic Carcinoma  . Metastatic cancer to liver 09/24/11    CURRENTLY RECEIVING RADIATION  . Radiation 10/15/11-10/28/2011    left proximal leg  . Hydronephrosis, left     SECONDARY TO RETROPERITONEAL METS    ALLERGIES:  is allergic to cephalexin; contrast media; other; and sulfa antibiotics.  MEDICATIONS:  Current Outpatient Prescriptions  Medication Sig Dispense Refill  . diclofenac sodium (VOLTAREN) 1 % GEL Apply 2 g topically 2 (two) times daily as needed (for pain). Apply to knee      . FLUoxetine (PROZAC) 20 MG capsule Take 20 mg by mouth daily at 12 noon.      . furosemide (LASIX) 20 MG tablet TtAKE A 1/2 TABLET = 10 MG 2 X WEEKLY, in am  AS DIRECTED.  Fluid Pill  6 tablet  0  . HYDROcodone-acetaminophen (NORCO/VICODIN) 5-325 MG per tablet Take 1 tablet by mouth every 6 (six) hours as needed for pain.  40 tablet  0  . HYDROmorphone (DILAUDID) 2 MG tablet Take 1 tablet (2 mg total) by mouth every 4 (four) hours as needed for pain.  10 tablet  0  . levothyroxine (SYNTHROID, LEVOTHROID) 75 MCG tablet Take 75 mcg by mouth every morning.       . lidocaine-prilocaine (EMLA) cream Apply topically as needed. Apply to PortaCath site as directed 2 hours prior to access.  30 g  1  . LORazepam (ATIVAN) 1 MG tablet Take 1 tablet (1 mg total) by mouth every 6 (six) hours as needed (for nausea or to relax).  40 tablet  1  . ondansetron (ZOFRAN-ODT) 8 MG disintegrating tablet Take 8 mg by mouth every 12 (twelve) hours as needed for nausea.      . pantoprazole (PROTONIX) 40 MG tablet       . polyethylene glycol powder (MIRALAX) powder Take 17 g by mouth as needed.      . Simethicone (GAS-X PO) Take 1 tablet by mouth daily as needed (for gas).       . sodium chloride (OCEAN) 0.65 % nasal spray Place 1 spray into the nose as needed (to keep nasal passages moist). Use every 1-2 hours while awake to keep nasal passages moist.  30 mL  12  . sucralfate (CARAFATE) 1 G tablet Take 1 tablet (1  g total) by mouth 4 (four) times daily -  before meals and at bedtime. Can dissolve in water, for stomach  60 tablet  0  . temazepam (RESTORIL) 15 MG capsule Take 1 capsule (15 mg total) by mouth at bedtime as needed for sleep.  30 capsule  0  . zolpidem (AMBIEN) 5 MG tablet Take 5 mg by mouth at bedtime as needed for sleep.      Marland Kitchen dexamethasone (DECADRON) 4 MG tablet TAKE FIVE TABS WITH FOOD 12 HOURS AND 6 HOURS PRIOR TO TAXOL.  20  tablet  1  . lactulose (CHRONULAC) 10 GM/15ML solution Take 15 mLs (10 g total) by mouth every 4 (four) hours.  240 mL  0  . potassium chloride SA (K-DUR,KLOR-CON) 20 MEQ tablet Take 1 tablet (20 mEq total) by mouth 2 (two) times daily.  30 tablet  0   No current facility-administered medications for this visit.    SURGICAL HISTORY:  Past Surgical History  Procedure Laterality Date  . Cholecystectomy  1970  . Femur im nail  09/24/2011     Procedure: INTRAMEDULLARY (IM) NAIL FEMORAL;  Surgeon: Toni Arthurs, MD;  Location: [facility];  Service: Orthopedics;  Laterality: Left;  FEMURAL NECK RX/ INTERTROCHANTERIC PATHOLOGIC FX  . Bone biopsy  09/25/2011    left medullary canal femur reaming=Metastatic Carcinoma  . Hysteroscopy w/d&c  01-11-2003    REMOVAL POLYP  . Left partial mastectomy / sentinel lymph node bx  08-28-2000    LEFT BREAST CANCER  . Cysto/ left retrograde pyleogram/  left ureteral stent placement  10-30-2011  . Cystoscopy w/ ureteral stent placement Left 07/22/2012    Procedure: CYSTOSCOPY WITH STENT REPLACEMENT;  Surgeon: Marcine Matar, MD;  Location: [facility];  Service: Urology;  Laterality: Left;    REVIEW OF SYSTEMS:  General: fatigue (-), night sweats (-), fever (-), pain (+) Lymph: palpable nodes (-) HEENT: vision changes (-), mucositis (-), gum bleeding (-), epistaxis (-) Cardiovascular: chest pain (-), palpitations (-) Pulmonary: shortness of breath (-), dyspnea on exertion (-), cough (-), hemoptysis (-) GI:  Early satiety  (-), melena (-), dysphagia (-), nausea/vomiting (+), diarrhea (-) GU: dysuria (-), hematuria (-), incontinence (-) Musculoskeletal: joint swelling (-), joint pain (-), back pain (-) Neuro: weakness (-), numbness (-), headache (-), confusion (-) Skin: Rash (-), lesions (-), dryness (-) Psych: depression (-), suicidal/homicidal ideation (-), feeling of hopelessness (-)   PHYSICAL EXAMINATION: Blood pressure 111/69, pulse 109, temperature 98.2 F (36.8 C), temperature source Oral, resp. rate 20, height 5\' 4"  (1.626 m), weight 149 lb 9.6 oz (67.858 kg). Body mass index is 25.67 kg/(m^2). General: Patient is a well appearing female in no acute distress HEENT: PERRLA, sclerae anicteric no conjunctival pallor, MMM Neck: supple, no palpable adenopathy Lungs: clear to auscultation bilaterally, no wheezes, rhonchi, or rales Cardiovascular: regular rate rhythm, S1, S2, no murmurs, rubs or gallops Abdomen: distended, non-tender, hypoactive bowel sounds, no HSM Extremities: warm and well perfused, no clubbing, cyanosis, 2+ edema bilaterally Skin: No rashes or lesions Neuro: Non-focal ECOG PERFORMANCE STATUS: 3 - Symptomatic, >50% confined to bed  LABORATORY DATA: Lab Results  Component Value Date   WBC 10.9* 09/27/2012   HGB 9.4* 09/27/2012   HCT 28.1* 09/27/2012   MCV 106.4* 09/27/2012   PLT 94* 09/27/2012      Chemistry      Component Value Date/Time   NA 136 09/27/2012 1344   NA 141 07/22/2012 0811   K 3.5 09/27/2012 1344   K 3.9 07/22/2012 0811   CL 109* 09/07/2012 1420   CL 102 06/29/2012 1302   CO2 27 09/27/2012 1344   CO2 26 06/29/2012 1302   BUN 16.0 09/27/2012 1344   BUN 14 06/29/2012 1302   CREATININE 0.9 09/27/2012 1344   CREATININE 0.81 06/29/2012 1302      Component Value Date/Time   CALCIUM 9.1 09/27/2012 1344   CALCIUM 10.0 06/29/2012 1302   ALKPHOS 98 09/27/2012 1344   ALKPHOS 99 06/29/2012 1302   AST 48* 09/27/2012 1344   AST 52* 06/29/2012 1302  ALT 14 09/27/2012 1344   ALT 27 06/29/2012 1302   BILITOT  2.83* 09/27/2012 1344   BILITOT 1.0 06/29/2012 1302       RADIOGRAPHIC STUDIES:  Dg Chest 2 View  09/01/2012   *RADIOLOGY REPORT*  Clinical Data: 72 year old female shortness of breath.  Heart palpitations.  Metastatic breast cancer.  CHEST - 2 VIEW  Comparison: 04/09/2012 and earlier.  Findings: Right chest IJ approach Port-A-Cath in place.  Catheter tip at the level of the lower SVC below the carina.  Partially visible left double-J ureteral stent, visualized portions stable. Small bilateral pleural effusions are new or increased.  Cardiac size and mediastinal contours are within normal limits. Postoperative changes to the chest wall and left axilla.  No pneumothorax or pulmonary edema.  No consolidation.  Stable mid thoracic mild compression fractures.  Stable heterogeneous bone mineralization compatible with known osseous metastatic disease.  IMPRESSION: 1.  New or increased small bilateral pleural effusions. 2.  Right chest Port-A-Cath in place with no adverse features evident. 3.  Left double-J ureteral stent partially re-identified.   Original Report Authenticated By: Erskine Speed, M.D.   US Abdomen Complete  09/21/2012   *RADIOLOGY REPORT*  Clinical Data:  Abdominal pain.  Metastatic breast cancer.  Left renal stent.  Post cholecystectomy  COMPLETE ABDOMINAL ULTRASOUND  Comparison:  Abdominal MRI on 02/27/2012  Findings:  Gallbladder:  Has been surgically removed  Common bile duct:  Has a diameter 7.2 mm.  This is within acceptable limits for a post cholecystectomy patient  Liver:  Two focal hepatic lesions are identified:  one target lesion in the medial segment of the right lobe of the liver measuring 3.0 x 2.4 x 3.0 cm and a smaller hypoechoic lesion identified in the left lobe of the liver measuring 1.1 x 0.9 x 1.0 cm. This would correspond with the patient's known metastatic disease.  No other discrete focal lesions are seen sonographically but overall hepatic echotexture is heterogeneous.  No  intrahepatic ductal dilatation is seen  IVC:  The proximal portion appears normal  Pancreas:  The head and body are normal in size and echotexture. The tail is incompletely assessed  Spleen:  Has a sagittal length of 12.1 cm inhomogeneous pattern with no focal lesions seen  Right Kidney:  Has a sagittal length of 9.9 cm.  No focal parenchymal abnormalities are seen and no signs of hydronephrosis are  Left Kidney:  Has a sagittal length of 10.6 cm.  A moderate degree of dilatation is seen.  A circular structure in the upper portion of the dilated pelvis likely represents the pelvis and of the of patient's indwelling stent. A shadowing echogenic focus in the lower pole calyceal system measuring 7 x 7 mm with posterior twinkle artifact shows no posterior shadowing but is suspicious for a non-obstructing stone.  Abdominal aorta:  Has a caliber of 2.1 cm with no aneurysmal dilatation  Other:  Small bilateral pleural effusions and moderate amount of fluid in the upper abdomen is noted.  IMPRESSION: Two focal areas in the liver sonographically would correlate with the patient's known hepatic metastatic disease, as described above.  Upper abdominal ascites and small bilateral pleural effusions.  Left renal hydronephrosis with probable lower pole nonobstructing calculus.   Original Report Authenticated By: Rhodia Albright, M.D.    ASSESSMENT:  Patient is a 72 year old female with   1. Stage IV ER positive, PR negative, HER-2/neu negative breast cancer metastatic to the bone and liver.  She  was initially stage IIIC disease.  She then recurred in July 2013 and was found to have metastatic disease to the bones and liver.  She received radiation therapy and then began on the Bolero 4 trial.  She had a good response to therapy, however, had to discontinue the everolimus early due to shortness of breath.  She continued on Zometa and Letrozole.  Letrozole was stopped in early May and she was started on weekly Taxol.  Taxol  was discontinued due to cytopenias.  She was started on Faslodex on June 24/2014, and tolerated the first dose well.    2. She does have a moderate amount of ascites in her abdomen as noted on abdominal ultrasound on 09/21/12.   3.  Patient is constipated, likely due to pain medications.  We will get a KUB today.    4. Volume overload.     PLAN:  1. Patient will continue Faslodex, and will receive this again on Wednesday.    2. I ordered an ultrasound guided paracentesis to remove the free fluid from the abdomen if possible and alleviate the pain and abdominal distention.    3. The patient was prescribed Lactulose every 4 hours until bowel movement.  She will start this today.    4. Mrs. Rudie's weight is up by 9 pounds this month.  She's taking Lasix 10mg  twice per week for this.  She was instructed to weigh herself daily and take 20mg  twice this week.  She will take K-dur on the days she takes the Lasix.  We will touch base on Friday, and if her weight is still up, we will increase the frequency of the Lasix.    5.  She will f/u on Wednesday for her Faslodex, Zometa, and on 7/23 for an appointment.     All questions were answered. The patient knows to call the clinic with any problems, questions or concerns. We can certainly see the patient much sooner if necessary.  I spent 40 minutes counseling the patient face to face. The total time spent in the appointment was 60 minutes.    Cherie Ouch Lyn Hollingshead, NP Medical Oncology [facility] Phone: [phone]    09/28/2012, 11:22 PM

## 2012-09-27 NOTE — Telephone Encounter (Signed)
09/27/12 at 9:22am - At 8:30am, the pt's husband, Nadine Counts, called the pt's research nurse.  He stated that his wife is "not doing well".  He stated that her abdomen was "hard".  He stated that he is aware that she is constipated, but the "miralax has not helped".  He said that Dr. Precious Reel nurse, Sallye Ober, instructed his wife to use Miralax for constipation last week.  He said that his wife is weak and does not want to get out of the bed.  He also added that she is not drinking much fluids.  The research nurse told the pt's husband that she can always be seen in the ER for an evaluation.  He said that his wife does not want to go to the ED because "they sent her home last time". The pt said that she cannot wait until her appointment to see Dr. Welton Flakes.  The research nurse told Nadine Counts, that Dr. Welton Flakes needs to be made aware of his wife's current status.  The research nurse called Dr. Milta Deiters nurse, Hale Drone, about the pt's issues.  Hale Drone said that she would discuss the situation with Dr. Welton Flakes.  The research nurse called Nadine Counts back and told him that Dr. Milta Deiters nurse would contact him later with Dr. Milta Deiters orders.  The pt's husband was given emotional support.  The pt's husband was thanked for letting us know about her condition.

## 2012-09-28 ENCOUNTER — Ambulatory Visit (HOSPITAL_COMMUNITY)
Admission: RE | Admit: 2012-09-28 | Discharge: 2012-09-28 | Disposition: A | Discharge status: Home / Self Care | Payer: Medicare Other | Source: Ambulatory Visit | Attending: Adult Health | Admitting: Adult Health

## 2012-09-28 DIAGNOSIS — C50919 Malignant neoplasm of unspecified site of unspecified female breast: Secondary | ICD-10-CM | POA: Insufficient documentation

## 2012-09-28 DIAGNOSIS — C801 Malignant (primary) neoplasm, unspecified: Secondary | ICD-10-CM | POA: Insufficient documentation

## 2012-09-28 DIAGNOSIS — R188 Other ascites: Secondary | ICD-10-CM | POA: Insufficient documentation

## 2012-09-28 NOTE — Procedures (Signed)
Successful US guided paracentesis from LLQ.  Yielded 2.3L of hazy yellow fluid.  No immediate complications.  Pt tolerated well.   Specimen was not sent for labs.  Brayton El PA-C 09/28/2012 1:56 PM

## 2012-09-29 ENCOUNTER — Other Ambulatory Visit: Payer: Medicare Other | Admitting: Lab

## 2012-09-29 ENCOUNTER — Other Ambulatory Visit: Payer: Self-pay | Admitting: Oncology

## 2012-09-29 ENCOUNTER — Ambulatory Visit (HOSPITAL_BASED_OUTPATIENT_CLINIC_OR_DEPARTMENT_OTHER): Payer: Medicare Other

## 2012-09-29 ENCOUNTER — Ambulatory Visit: Payer: Medicare Other

## 2012-09-29 VITALS — BP 104/64 | HR 84 | Temp 98.0°F

## 2012-09-29 DIAGNOSIS — C787 Secondary malignant neoplasm of liver and intrahepatic bile duct: Secondary | ICD-10-CM

## 2012-09-29 DIAGNOSIS — Z5111 Encounter for antineoplastic chemotherapy: Secondary | ICD-10-CM

## 2012-09-29 DIAGNOSIS — C7951 Secondary malignant neoplasm of bone: Secondary | ICD-10-CM

## 2012-09-29 DIAGNOSIS — C7952 Secondary malignant neoplasm of bone marrow: Secondary | ICD-10-CM

## 2012-09-29 DIAGNOSIS — C50419 Malignant neoplasm of upper-outer quadrant of unspecified female breast: Secondary | ICD-10-CM

## 2012-09-29 MED ORDER — HEPARIN SOD (PORK) LOCK FLUSH 100 UNIT/ML IV SOLN
500.0000 [IU] | Freq: Once | INTRAVENOUS | Status: AC
Start: 2012-09-29 — End: 2012-09-29
  Administered 2012-09-29: 500 [IU] via INTRAVENOUS
  Filled 2012-09-29: qty 5

## 2012-09-29 MED ORDER — SODIUM CHLORIDE 0.9 % IJ SOLN
10.0000 mL | INTRAMUSCULAR | Status: DC | PRN
  Administered 2012-09-29: 10 mL via INTRAVENOUS
  Filled 2012-09-29: qty 10

## 2012-09-29 MED ORDER — SODIUM CHLORIDE 0.9 % IV SOLN
INTRAVENOUS | Status: DC
  Administered 2012-09-29: 15:00:00 via INTRAVENOUS

## 2012-09-29 MED ORDER — FULVESTRANT 250 MG/5ML IM SOLN
500.0000 mg | INTRAMUSCULAR | Status: DC
  Administered 2012-09-29: 500 mg via INTRAMUSCULAR
  Filled 2012-09-29: qty 10

## 2012-09-29 MED ORDER — ZOLEDRONIC ACID 4 MG/100ML IV SOLN
4.0000 mg | INTRAVENOUS | Status: DC
  Administered 2012-09-29: 4 mg via INTRAVENOUS
  Filled 2012-09-29: qty 100

## 2012-09-29 NOTE — Progress Notes (Signed)
Pt. Still having problems with constipation.  She has not had a bowel movement since last week.  Her husband says she has cut way back on her pain medicine.  She started taking lactulose on Monday per Augustin Schooling.  She also stopped her miralax.  Discussed with Dr. Welton Flakes.  She needs to do both the miralax and the lactulose.  Also suggested she take warm fluids - tea , prune juice or coffee.  She will try this and call Dr. Welton Flakes on Friday.  Discussed the possible use of Relastor- injection for opiate induced constipation.  She will try these other methods first but will consider it if she does not get results by Friday.

## 2012-09-29 NOTE — Patient Instructions (Signed)
Fulvestrant injection What is this medicine? FULVESTRANT (ful VES trant) blocks the effects of estrogen. It is used to treat breast cancer in women past the age of menopause. This medicine may be used for other purposes; ask your health care provider or pharmacist if you have questions. What should I tell my health care provider before I take this medicine? They need to know if you have any of these conditions: -bleeding problems -liver disease -low levels of platelets in the blood -an unusual or allergic reaction to fulvestrant, other medicines, foods, dyes, or preservatives -pregnant or trying to get pregnant -breast-feeding How should I use this medicine? This medicine is for injection into a muscle. It is usually given by a health care professional in a hospital or clinic setting. Talk to your pediatrician regarding the use of this medicine in children. Special care may be needed. Overdosage: If you think you have taken too much of this medicine contact a poison control center or emergency room at once. NOTE: This medicine is only for you. Do not share this medicine with others. What if I miss a dose? It is important not to miss your dose. Call your doctor or health care professional if you are unable to keep an appointment. What may interact with this medicine? -medicines that treat or prevent blood clots like warfarin, enoxaparin, and dalteparin This list may not describe all possible interactions. Give your health care provider a list of all the medicines, herbs, non-prescription drugs, or dietary supplements you use. Also tell them if you smoke, drink alcohol, or use illegal drugs. Some items may interact with your medicine. What should I watch for while using this medicine? Your condition will be monitored carefully while you are receiving this medicine. You will need important blood work done while you are taking this medicine. Do not become pregnant while taking this medicine.  Women should inform their doctor if they wish to become pregnant or think they might be pregnant. There is a potential for serious side effects to an unborn child. Talk to your health care professional or pharmacist for more information. What side effects may I notice from receiving this medicine? Side effects that you should report to your doctor or health care professional as soon as possible: -allergic reactions like skin rash, itching or hives, swelling of the face, lips, or tongue -feeling faint or lightheaded, falls -fever or flu-like symptoms -sore throat -vaginal bleeding Side effects that usually do not require medical attention (report to your doctor or health care professional if they continue or are bothersome): -aches, pains -constipation or diarrhea -headache -hot flashes -nausea, vomiting -pain at site where injected -stomach pain This list may not describe all possible side effects. Call your doctor for medical advice about side effects. You may report side effects to FDA at 1-800-FDA-1088. Where should I keep my medicine? This drug is given in a hospital or clinic and will not be stored at home. NOTE: This sheet is a summary. It may not cover all possible information. If you have questions about this medicine, talk to your doctor, pharmacist, or health care provider.  2013, [employer] Standard. (07/19/2007 3:39:24 PM) Zoledronic Acid injection (Hypercalcemia, Oncology) What is this medicine? ZOLEDRONIC ACID (ZOE le dron ik AS id) lowers the amount of calcium loss from bone. It is used to treat too much calcium in your blood from cancer. It is also used to prevent complications of cancer that has spread to the bone. This medicine may be used for   other purposes; ask your health care provider or pharmacist if you have questions. What should I tell my health care provider before I take this medicine? They need to know if you have any of these conditions: -aspirin-sensitive  asthma -dental disease -kidney disease -an unusual or allergic reaction to zoledronic acid, other medicines, foods, dyes, or preservatives -pregnant or trying to get pregnant -breast-feeding How should I use this medicine? This medicine is for infusion into a vein. It is given by a health care professional in a hospital or clinic setting. Talk to your pediatrician regarding the use of this medicine in children. Special care may be needed. Overdosage: If you think you have taken too much of this medicine contact a poison control center or emergency room at once. NOTE: This medicine is only for you. Do not share this medicine with others. What if I miss a dose? It is important not to miss your dose. Call your doctor or health care professional if you are unable to keep an appointment. What may interact with this medicine? -certain antibiotics given by injection -NSAIDs, medicines for pain and inflammation, like ibuprofen or naproxen -some diuretics like bumetanide, furosemide -teriparatide -thalidomide This list may not describe all possible interactions. Give your health care provider a list of all the medicines, herbs, non-prescription drugs, or dietary supplements you use. Also tell them if you smoke, drink alcohol, or use illegal drugs. Some items may interact with your medicine. What should I watch for while using this medicine? Visit your doctor or health care professional for regular checkups. It may be some time before you see the benefit from this medicine. Do not stop taking your medicine unless your doctor tells you to. Your doctor may order blood tests or other tests to see how you are doing. Women should inform their doctor if they wish to become pregnant or think they might be pregnant. There is a potential for serious side effects to an unborn child. Talk to your health care professional or pharmacist for more information. You should make sure that you get enough calcium and  vitamin D while you are taking this medicine. Discuss the foods you eat and the vitamins you take with your health care professional. Some people who take this medicine have severe bone, joint, and/or muscle pain. This medicine may also increase your risk for a broken thigh bone. Tell your doctor right away if you have pain in your upper leg or groin. Tell your doctor if you have any pain that does not go away or that gets worse. What side effects may I notice from receiving this medicine? Side effects that you should report to your doctor or health care professional as soon as possible: -allergic reactions like skin rash, itching or hives, swelling of the face, lips, or tongue -anxiety, confusion, or depression -breathing problems -changes in vision -feeling faint or lightheaded, falls -jaw burning, cramping, pain -muscle cramps, stiffness, or weakness -trouble passing urine or change in the amount of urine Side effects that usually do not require medical attention (report to your doctor or health care professional if they continue or are bothersome): -bone, joint, or muscle pain -fever -hair loss -irritation at site where injected -loss of appetite -nausea, vomiting -stomach upset -tired This list may not describe all possible side effects. Call your doctor for medical advice about side effects. You may report side effects to FDA at 1-800-FDA-1088. Where should I keep my medicine? This drug is given in a hospital or clinic   and will not be stored at home. NOTE: This sheet is a summary. It may not cover all possible information. If you have questions about this medicine, talk to your doctor, pharmacist, or health care provider.  2012, [employer] Standard. (09/06/2010 9:06:58 AM) 

## 2012-09-30 NOTE — Progress Notes (Signed)
ATTENDING'S ATTESTATION:  I personally reviewed patient's chart, examined patient myself, formulated the treatment plan as followed.    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 She was originally diagnosed as his T1 N3 in June 2002. She had 13 lymph nodes that were positive for metastatic disease the tumor was ER positive PR positive HER-2/neu negative. She underwent a lumpectomy and axillary lymph node dissection. She has Adriamycin Cytoxan adjuvantly followed by [facility] radiation and tamoxifen through December 2013 and she was switched to Arimidex until June 2011. In June 2013 patient had an acute left hip pain that showed metastatic disease with pathologic fracture. Pathology was consistent with a recurrent ER positive breast cancer. She has received radiation therapy to the hip.  Patient received treatment on clinical study BOLERO-4 starting August 2013 but she was discontinued on the study in December 2013 do to dyspnea. She was then started on letrozole which was discontinued in May 2014. She received chemotherapy but she also could not tolerate. Most recently when she was seen by Dr. Darrold Span she was planned on starting Faslodex injections. She received her first injection on 09/14/2012. She will receive one later this week. She will also continue the Zometa q. monthly.  Patient is constipated and we have recommended a bowel regimen. We will also obtain an ultrasound-guided paracentesis to see if this alleviates her distention. She'll continue to be seen very regularly. Her next appointment with me will be on 10/13/2012.  Drue Second, MD Medical/Oncology [facility] [phone] (beeper) [phone] (Office)

## 2012-10-04 ENCOUNTER — Other Ambulatory Visit (HOSPITAL_BASED_OUTPATIENT_CLINIC_OR_DEPARTMENT_OTHER): Payer: Medicare Other | Admitting: Lab

## 2012-10-04 ENCOUNTER — Telehealth: Payer: Self-pay | Admitting: *Deleted

## 2012-10-04 ENCOUNTER — Ambulatory Visit (HOSPITAL_BASED_OUTPATIENT_CLINIC_OR_DEPARTMENT_OTHER): Payer: Medicare Other | Admitting: Adult Health

## 2012-10-04 ENCOUNTER — Encounter: Payer: Self-pay | Admitting: Adult Health

## 2012-10-04 VITALS — BP 130/71 | HR 106 | Temp 98.7°F | Resp 20 | Ht 64.0 in | Wt 147.8 lb

## 2012-10-04 DIAGNOSIS — C50919 Malignant neoplasm of unspecified site of unspecified female breast: Secondary | ICD-10-CM

## 2012-10-04 DIAGNOSIS — C801 Malignant (primary) neoplasm, unspecified: Secondary | ICD-10-CM

## 2012-10-04 DIAGNOSIS — C50912 Malignant neoplasm of unspecified site of left female breast: Secondary | ICD-10-CM

## 2012-10-04 DIAGNOSIS — R609 Edema, unspecified: Secondary | ICD-10-CM

## 2012-10-04 DIAGNOSIS — C7951 Secondary malignant neoplasm of bone: Secondary | ICD-10-CM

## 2012-10-04 LAB — COMPREHENSIVE METABOLIC PANEL (CC13)
Albumin: 2.6 g/dL — ABNORMAL LOW (ref 3.5–5.0)
Alkaline Phosphatase: 123 U/L (ref 40–150)
CO2: 23 mEq/L (ref 22–29)
Chloride: 105 mEq/L (ref 98–109)
Glucose: 170 mg/dl — ABNORMAL HIGH (ref 70–140)
Potassium: 3.6 mEq/L (ref 3.5–5.1)
Sodium: 137 mEq/L (ref 136–145)
Total Protein: 5.2 g/dL — ABNORMAL LOW (ref 6.4–8.3)

## 2012-10-04 LAB — CBC WITH DIFFERENTIAL/PLATELET
Eosinophils Absolute: 0.1 10*3/uL (ref 0.0–0.5)
MONO#: 0.8 10*3/uL (ref 0.1–0.9)
MONO%: 6 % (ref 0.0–14.0)
NEUT#: 8.1 10*3/uL — ABNORMAL HIGH (ref 1.5–6.5)
RBC: 2.77 10*6/uL — ABNORMAL LOW (ref 3.70–5.45)
RDW: 19.1 % — ABNORMAL HIGH (ref 11.2–14.5)
WBC: 12.9 10*3/uL — ABNORMAL HIGH (ref 3.9–10.3)
lymph#: 3.9 10*3/uL — ABNORMAL HIGH (ref 0.9–3.3)

## 2012-10-04 MED ORDER — FUROSEMIDE 20 MG PO TABS: 20.0000 mg | ORAL_TABLET | Freq: Every day | ORAL | Status: DC

## 2012-10-04 NOTE — Progress Notes (Signed)
OFFICE PROGRESS NOTE  CC**  [name], MD [address] [place] [address]  DIAGNOSIS: 72 year old female with stage IV invasive ductal carcinoma.  PRIOR THERAPY:  1.  History is of T1N3 invasive ductal left breast cancer diagnosed June 2002 with 13 nodes involved including extracapsular extension, ER + 67%, PR + 39%, HER-2/neu negative. She was treated with lumpectomy and 18 node axillary dissection, adjuvant adriamycin/cytoxan followed by [facility] chemotherapy, local radiation, tamoxifen from April 2003 thru Dec 2003 then arimidex from Dec 2004 thru June 2011. The arimidex was continued for that duration due to high risk features, stopped in June 2011 as there was no evidence of disease and with known osteoporosis.   2.  She developed acute pain in left hip in June 2013, with MRI hip 09-20-11 showing metastatic disease with pathologic fracture of femoral neck, nailing done by Dr Victorino Dike on 09-24-11. Pathology confirmed metastatic breast carcinoma, ER+ 100%, PR negative and HER 2 negative by [facility]. She had left ureteral stent by Dr Retta Diones 11-01-11 for obstructive hydronephrosis.   3.  She received radiation 3000 cGy in 10 fractions to left femur 7-24 thru 10-28-11 by Dr Roselind Messier. Bolero 4 study was started 11-12-11. Everolimus dose was reduced due to epistaxis. Restaging scans done per protocol in Dec 2013 (bone scan, CT chest, MRI abdomen and pelvis) all showed further improvement in metastatic disease, without other progression. Everolimus was discontinued after 03-03-12 due to dyspnea. Respiratory problems were evaluated with VQ, echocardiogram and bubble echo, and PFTs by Dr Craige Cotta, and the SOB progressively improved off of everolimus. She was continued on Letrozole and Zometa.  She had left ureteral stent changed by Dr Retta Diones on 07-22-2012. CA 2729 had been 354 in July 2013, down to 224 in Dec 2013 when everolimus DCd,   4.  CA 27-29 then was 299 in March 2014, 448 in April and was up  to 639 by labs drawn on 07-27-12. Letrozole was discontinued on 07-28-12 and first taxol given 08-12-12 (80 mg/m2), not tolerated well due to cytopenias. She is also on zometa, last given 09-01-12.  5. She was started on Faslodex by Dr. Darrold Span on 09/14/12.    CURRENT THERAPY: Faslodex  INTERVAL HISTORY: Kelli [name] 72 y.o. female  returns for abdominal pain that has been going on for four months, and has been progressing over the past 2-3 weeks.  She was seen for this last week and feels better than last week, but still not great.  Last week we did a number of things.  First, she had a KUB due to not having a bowel movement in 10 days.  IT showed no obstruction, free air, and did not note any stool burden as well.   She took lactulose every 4 hours and has continued to have liquid stools with no formation.  She has also been taking colace, and miralax.  She says sometimes she has the sensation of being constipated, but only diarrhea comes out.  She also had 2.3 liters removed off of her abdomen, which gave her a lot of relief.  She took Lasix 20mg  twice last week and noted minimal improvement in her swelling.  Her weight is only down 2 pounds and that is after lasix and a paracentesis.  Otherwise, a 10 point ROS is neg.   MEDICAL HISTORY: Past Medical History  Diagnosis Date  . Thyroid disease IN 1982    HX IODINE-131 ABLATION FOR HYPERTHYROIDISM  . Osteoporosis   . Vitamin  D insufficiency   . Chronic fatigue   . Depression   . Anxiety   . Insomnia   . History of chemotherapy     Adriamycin/cytoxan followed by [facility]  08/2000/tamoxifen 06/2001-02/2003,then arimidex 02/2003-08/2009  . 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 History of radiation therapy 12/28/00-02/12/01    left breast  With Dr. Jamie Kato  . Hypothyroidism   . Fatigue   . Short of breath on exertion   . Breast cancer, left JUNE OF 2002    LEFT BREAST, ER/PR +, HER 2 -  . Metastasis to bone 09/24/11    left femur=bone  Metastatic Carcinoma  . Metastatic cancer to liver 09/24/11    CURRENTLY RECEIVING RADIATION  . Radiation 10/15/11-10/28/2011    left proximal leg  . Hydronephrosis, left     SECONDARY TO RETROPERITONEAL METS    ALLERGIES:  is allergic to cephalexin; contrast media; other; and sulfa antibiotics.  MEDICATIONS:  Current Outpatient Prescriptions  Medication Sig Dispense Refill  . diclofenac sodium (VOLTAREN) 1 % GEL Apply 2 g topically 2 (two) times daily as needed (for pain). Apply to knee      . docusate sodium (COLACE) 100 MG capsule Take 100 mg by mouth daily.      Marland Kitchen FLUoxetine (PROZAC) 20 MG capsule Take 20 mg by mouth daily at 12 noon.      Marland Kitchen HYDROcodone-acetaminophen (NORCO/VICODIN) 5-325 MG per tablet Take 1 tablet by mouth every 6 (six) hours as needed for pain.  40 tablet  0  . HYDROmorphone (DILAUDID) 2 MG tablet Take 1 tablet (2 mg total) by mouth every 4 (four) hours as needed for pain.  10 tablet  0  . lactulose (CHRONULAC) 10 GM/15ML solution Take 15 mLs (10 g total) by mouth every 4 (four) hours.  240 mL  0  . levothyroxine (SYNTHROID, LEVOTHROID) 75 MCG tablet Take 75 mcg by mouth every morning.       . lidocaine-prilocaine (EMLA) cream Apply topically as needed. Apply to PortaCath site as directed 2 hours prior to access.  30 g  1  . LORazepam (ATIVAN) 1 MG tablet Take 1 tablet (1 mg total) by mouth every 6 (six) hours as needed (for nausea or to relax).  40 tablet  1  . ondansetron (ZOFRAN-ODT) 8 MG disintegrating tablet Take 8 mg by mouth every 12 (twelve) hours as needed for nausea.      . pantoprazole (PROTONIX) 40 MG tablet       . polyethylene glycol powder (MIRALAX) powder Take 17 g by mouth as needed.      . potassium chloride SA (K-DUR,KLOR-CON) 20 MEQ tablet Take 1 tablet (20 mEq total) by mouth 2 (two) times daily.  30 tablet  0  . Simethicone (GAS-X PO) Take 1 tablet by mouth daily as needed (for gas).       . sodium chloride (OCEAN) 0.65 % nasal spray Place 1 spray  into the nose as needed (to keep nasal passages moist). Use every 1-2 hours while awake to keep nasal passages moist.  30 mL  12  . sucralfate (CARAFATE) 1 G tablet Take 1 tablet (1 g total) by mouth 4 (four) times daily -  before meals and at bedtime. Can dissolve in water, for stomach  60 tablet  0  . temazepam (RESTORIL) 15 MG capsule Take 1 capsule (15 mg total) by mouth at bedtime as needed for sleep.  30 capsule  0  . zolpidem (AMBIEN) 5 MG  tablet Take 5 mg by mouth at bedtime as needed for sleep.      Marland Kitchen dexamethasone (DECADRON) 4 MG tablet TAKE FIVE TABS WITH FOOD 12 HOURS AND 6 HOURS PRIOR TO TAXOL.  20 tablet  1  . furosemide (LASIX) 20 MG tablet Take 1 tablet (20 mg total) by mouth daily.  30 tablet  0   No current facility-administered medications for this visit.    SURGICAL HISTORY:  Past Surgical History  Procedure Laterality Date  . Cholecystectomy  1970  . Femur im nail  09/24/2011     Procedure: INTRAMEDULLARY (IM) NAIL FEMORAL;  Surgeon: Toni Arthurs, MD;  Location: [facility];  Service: Orthopedics;  Laterality: Left;  FEMURAL NECK RX/ INTERTROCHANTERIC PATHOLOGIC FX  . Bone biopsy  09/25/2011    left medullary canal femur reaming=Metastatic Carcinoma  . Hysteroscopy w/d&c  01-11-2003    REMOVAL POLYP  . Left partial mastectomy / sentinel lymph node bx  08-28-2000    LEFT BREAST CANCER  . Cysto/ left retrograde pyleogram/  left ureteral stent placement  10-30-2011  . Cystoscopy w/ ureteral stent placement Left 07/22/2012    Procedure: CYSTOSCOPY WITH STENT REPLACEMENT;  Surgeon: Marcine Matar, MD;  Location: [facility];  Service: Urology;  Laterality: Left;    REVIEW OF SYSTEMS:  General: fatigue (-), night sweats (-), fever (-), pain (+) Lymph: palpable nodes (-) HEENT: vision changes (-), mucositis (-), gum bleeding (-), epistaxis (-) Cardiovascular: chest pain (-), palpitations (-) Pulmonary: shortness of breath (-), dyspnea on exertion (-), cough (-),  hemoptysis (-) GI:  Early satiety (-), melena (-), dysphagia (-), nausea/vomiting (+), diarrhea (-) GU: dysuria (-), hematuria (-), incontinence (-) Musculoskeletal: joint swelling (-), joint pain (-), back pain (-) Neuro: weakness (-), numbness (-), headache (-), confusion (-) Skin: Rash (-), lesions (-), dryness (-) Psych: depression (-), suicidal/homicidal ideation (-), feeling of hopelessness (-)   PHYSICAL EXAMINATION: Blood pressure 130/71, pulse 106, temperature 98.7 F (37.1 C), temperature source Oral, resp. rate 20, height 5\' 4"  (1.626 m), weight 147 lb 12.8 oz (67.042 kg). Body mass index is 25.36 kg/(m^2). General: Patient is a well appearing female in no acute distress HEENT: PERRLA, sclerae anicteric no conjunctival pallor, MMM Neck: supple, no palpable adenopathy Lungs: clear to auscultation bilaterally, no wheezes, rhonchi, or rales Cardiovascular: regular rate rhythm, S1, S2, no murmurs, rubs or gallops Abdomen: distended, non-tender, normoactive bowel sounds, no HSM Extremities: warm and well perfused, no clubbing, cyanosis, 2+ edema bilaterally Skin: No rashes or lesions Neuro: Non-focal ECOG PERFORMANCE STATUS: 3 - Symptomatic, >50% confined to bed  LABORATORY DATA: Lab Results  Component Value Date   WBC 12.9* 10/04/2012   HGB 9.5* 10/04/2012   HCT 29.5* 10/04/2012   MCV 106.5* 10/04/2012   PLT 159 10/04/2012      Chemistry      Component Value Date/Time   NA 137 10/04/2012 1410   NA 141 07/22/2012 0811   K 3.6 10/04/2012 1410   K 3.9 07/22/2012 0811   CL 109* 09/07/2012 1420   CL 102 06/29/2012 1302   CO2 23 10/04/2012 1410   CO2 26 06/29/2012 1302   BUN 9.6 10/04/2012 1410   BUN 14 06/29/2012 1302   CREATININE 0.9 10/04/2012 1410   CREATININE 0.81 06/29/2012 1302      Component Value Date/Time   CALCIUM 9.1 10/04/2012 1410   CALCIUM 10.0 06/29/2012 1302   ALKPHOS 123 10/04/2012 1410   ALKPHOS 99 06/29/2012 1302   AST  67* 10/04/2012 1410   AST 52* 06/29/2012 1302   ALT  15 10/04/2012 1410   ALT 27 06/29/2012 1302   BILITOT 2.70* 10/04/2012 1410   BILITOT 1.0 06/29/2012 1302       RADIOGRAPHIC STUDIES:  Dg Chest 2 View  09/01/2012   *RADIOLOGY REPORT*  Clinical Data: 72 year old female shortness of breath.  Heart palpitations.  Metastatic breast cancer.  CHEST - 2 VIEW  Comparison: 04/09/2012 and earlier.  Findings: Right chest IJ approach Port-A-Cath in place.  Catheter tip at the level of the lower SVC below the carina.  Partially visible left double-J ureteral stent, visualized portions stable. Small bilateral pleural effusions are new or increased.  Cardiac size and mediastinal contours are within normal limits. Postoperative changes to the chest wall and left axilla.  No pneumothorax or pulmonary edema.  No consolidation.  Stable mid thoracic mild compression fractures.  Stable heterogeneous bone mineralization compatible with known osseous metastatic disease.  IMPRESSION: 1.  New or increased small bilateral pleural effusions. 2.  Right chest Port-A-Cath in place with no adverse features evident. 3.  Left double-J ureteral stent partially re-identified.   Original Report Authenticated By: Erskine Speed, M.D.   US Abdomen Complete  09/21/2012   *RADIOLOGY REPORT*  Clinical Data:  Abdominal pain.  Metastatic breast cancer.  Left renal stent.  Post cholecystectomy  COMPLETE ABDOMINAL ULTRASOUND  Comparison:  Abdominal MRI on 02/27/2012  Findings:  Gallbladder:  Has been surgically removed  Common bile duct:  Has a diameter 7.2 mm.  This is within acceptable limits for a post cholecystectomy patient  Liver:  Two focal hepatic lesions are identified:  one target lesion in the medial segment of the right lobe of the liver measuring 3.0 x 2.4 x 3.0 cm and a smaller hypoechoic lesion identified in the left lobe of the liver measuring 1.1 x 0.9 x 1.0 cm. This would correspond with the patient's known metastatic disease.  No other discrete focal lesions are seen sonographically but  overall hepatic echotexture is heterogeneous.  No intrahepatic ductal dilatation is seen  IVC:  The proximal portion appears normal  Pancreas:  The head and body are normal in size and echotexture. The tail is incompletely assessed  Spleen:  Has a sagittal length of 12.1 cm inhomogeneous pattern with no focal lesions seen  Right Kidney:  Has a sagittal length of 9.9 cm.  No focal parenchymal abnormalities are seen and no signs of hydronephrosis are  Left Kidney:  Has a sagittal length of 10.6 cm.  A moderate degree of dilatation is seen.  A circular structure in the upper portion of the dilated pelvis likely represents the pelvis and of the of patient's indwelling stent. A shadowing echogenic focus in the lower pole calyceal system measuring 7 x 7 mm with posterior twinkle artifact shows no posterior shadowing but is suspicious for a non-obstructing stone.  Abdominal aorta:  Has a caliber of 2.1 cm with no aneurysmal dilatation  Other:  Small bilateral pleural effusions and moderate amount of fluid in the upper abdomen is noted.  IMPRESSION: Two focal areas in the liver sonographically would correlate with the patient's known hepatic metastatic disease, as described above.  Upper abdominal ascites and small bilateral pleural effusions.  Left renal hydronephrosis with probable lower pole nonobstructing calculus.   Original Report Authenticated By: Rhodia Albright, M.D.    ASSESSMENT:  Patient is a 72 year old female with   1. Stage IV ER positive, PR negative, HER-2/neu  negative breast cancer metastatic to the bone and liver.  She was initially stage IIIC disease.  She then recurred in July 2013 and was found to have metastatic disease to the bones and liver.  She received radiation therapy and then began on the Bolero 4 trial.  She had a good response to therapy, however, had to discontinue the everolimus early due to shortness of breath.  She continued on Zometa and Letrozole.  Letrozole was stopped in early  May and she was started on weekly Taxol.  Taxol was discontinued due to cytopenias.  She was started on Faslodex on June 24/2014, and tolerated the first dose well.    2. She does have a moderate amount of ascites in her abdomen as noted on abdominal ultrasound on 09/21/12.   3.  Patient has problems related to constipation, likely due to pain medications.      4. Volume overload.    5. Non-severe malnutrition   PLAN:  1. Patient will continue Faslodex.    2. I ordered another ultrasound guided paracentesis to remove the free fluid from the abdomen if possible and alleviate the pain and abdominal distention.  We discussed in detail that the fluid would likely come back and she may need a pleurx in the future.    3. The patient will stop taking the Lactulose.  She will continue taking the Colace daily.  She will start Miralax tomorrow.  Should she have the sensation of constipation again from her rectum, I recommended she try an enema.     4. Mrs. Arkwright's will take Lasix 20mg  daily with [id number] daily for her lower extremity swelling.  5.  I recommended she increase her protein intake.  We discussed a protein rich diet, and ensure/boost high protein.    6. We will see Mrs. Delission next week on 10/13/12 for her next appt.        All questions were answered. The patient knows to call the clinic with any problems, questions or concerns. We can certainly see the patient much sooner if necessary.  I spent 25 minutes counseling the patient face to face. The total time spent in the appointment was 30 minutes.    Cherie Ouch Lyn Hollingshead, NP Medical Oncology [facility] Phone: [phone]    10/04/2012, 3:28 PM

## 2012-10-04 NOTE — Telephone Encounter (Signed)
appts made and printed...td 

## 2012-10-04 NOTE — Patient Instructions (Addendum)
Take Lasix 20mg  daily.  Take Potassium Chloride daily.  I have ordered a paracentesis.  Stop taking the lactulose.  Take the Colace/Docusate daily, and take the Miralax daily starting tomorrow.  We will see you back on 10/13/12.  Please call us if you have any questions or concerns.    High Protein Diet A high protein diet means that high protein foods are added to your diet. Getting more protein in the diet is important for a number of reasons. Protein helps the body to build tissue, muscle, and to repair damage. People who have had surgery, injuries such as broken bones, infections, and burns, or illnesses such as cancer, may need more protein in their diet.  SERVING SIZES Measuring foods and serving sizes helps to make sure you are getting the right amount of food. The list below tells how big or small some common serving sizes are.   1 oz.........4 stacked dice.  3 oz........Marland KitchenDeck of cards.  1 tsp.......Marland KitchenTip of little finger.  1 tbs......Marland KitchenMarland KitchenThumb.  2 tbs.......Marland KitchenGolf ball.   cup......Marland KitchenHalf of a fist.  1 cup.......Marland KitchenA fist. FOOD SOURCES OF PROTEIN Listed below are some food sources of protein and the amount of protein they contain. Your Registered Dietitian can calculate how many grams of protein you need for your medical condition. High protein foods can be added to the diet at mealtime or as snacks. Be sure to have at least 1 protein-containing food at each meal and snack to ensure adequate intake.  Meats and Meat Substitutes / Protein (g)  3 oz poultry (chicken, [place]) / 26 g  3 oz tuna, canned in water / 26 g  3 oz fish (cod) / 21 g  3 oz red meat (beef, pork) / 21 g  4 oz tofu / 9 g  1 egg / 6 g   cup egg substitute / 5 g  1 cup dried beans / 15 g  1 cup soy milk / 4 g Dairy / Protein (g)  1 cup milk (skim, 1%, 2%, whole) / 8 g   cup evaporated milk / 9 g  1 cup buttermilk / 8 g  1 cup low-fat plain yogurt / 11 g  1 cup regular plain yogurt / 9 g    cup cottage cheese / 14 g  1 oz cheddar cheese / 7 g Nuts / Protein (g)  2 tbs peanut butter / 8 g  1 oz peanuts / 7 g  2 tbs cashews / 5 g  2 tbs almonds / 5 g Document Released: 03/10/2005 Document Revised: 06/02/2011 Document Reviewed: 12/11/2006 [employer] Patient Information 2014 [place], [place].

## 2012-10-05 ENCOUNTER — Ambulatory Visit (HOSPITAL_COMMUNITY): Admission: RE | Admit: 2012-10-05 | Payer: Medicare Other | Source: Ambulatory Visit

## 2012-10-07 ENCOUNTER — Ambulatory Visit (HOSPITAL_COMMUNITY)
Admission: RE | Admit: 2012-10-07 | Discharge: 2012-10-07 | Disposition: A | Discharge status: Home / Self Care | Payer: Medicare Other | Source: Ambulatory Visit | Attending: Adult Health | Admitting: Adult Health

## 2012-10-07 VITALS — BP 126/65

## 2012-10-07 DIAGNOSIS — R188 Other ascites: Secondary | ICD-10-CM | POA: Insufficient documentation

## 2012-10-07 DIAGNOSIS — C50919 Malignant neoplasm of unspecified site of unspecified female breast: Secondary | ICD-10-CM | POA: Insufficient documentation

## 2012-10-07 DIAGNOSIS — C7951 Secondary malignant neoplasm of bone: Secondary | ICD-10-CM | POA: Insufficient documentation

## 2012-10-07 NOTE — Procedures (Signed)
US guided therapeutic paracentesis performed yielding 3.5 liters slightly turbid, yellow fluid. No immediate complications.

## 2012-10-13 ENCOUNTER — Telehealth: Payer: Self-pay | Admitting: Oncology

## 2012-10-13 ENCOUNTER — Encounter: Payer: Self-pay | Admitting: Adult Health

## 2012-10-13 ENCOUNTER — Ambulatory Visit (HOSPITAL_BASED_OUTPATIENT_CLINIC_OR_DEPARTMENT_OTHER): Payer: Medicare Other | Admitting: Adult Health

## 2012-10-13 ENCOUNTER — Other Ambulatory Visit (HOSPITAL_BASED_OUTPATIENT_CLINIC_OR_DEPARTMENT_OTHER): Payer: Medicare Other | Admitting: Lab

## 2012-10-13 VITALS — BP 114/63 | HR 110 | Temp 98.2°F | Resp 20 | Ht 64.0 in | Wt 147.7 lb

## 2012-10-13 DIAGNOSIS — C50919 Malignant neoplasm of unspecified site of unspecified female breast: Secondary | ICD-10-CM

## 2012-10-13 DIAGNOSIS — C801 Malignant (primary) neoplasm, unspecified: Secondary | ICD-10-CM

## 2012-10-13 DIAGNOSIS — C7951 Secondary malignant neoplasm of bone: Secondary | ICD-10-CM

## 2012-10-13 LAB — CBC WITH DIFFERENTIAL/PLATELET
Basophils Absolute: 0.1 10*3/uL (ref 0.0–0.1)
Eosinophils Absolute: 0.1 10*3/uL (ref 0.0–0.5)
HCT: 29.3 % — ABNORMAL LOW (ref 34.8–46.6)
HGB: 9.7 g/dL — ABNORMAL LOW (ref 11.6–15.9)
MCH: 36.4 pg — ABNORMAL HIGH (ref 25.1–34.0)
MONO#: 0.9 10*3/uL (ref 0.1–0.9)
NEUT%: 62.5 % (ref 38.4–76.8)
WBC: 13.3 10*3/uL — ABNORMAL HIGH (ref 3.9–10.3)
lymph#: 3.9 10*3/uL — ABNORMAL HIGH (ref 0.9–3.3)

## 2012-10-13 LAB — COMPREHENSIVE METABOLIC PANEL (CC13)
BUN: 13.6 mg/dL (ref 7.0–26.0)
CO2: 23 mEq/L (ref 22–29)
Calcium: 9.5 mg/dL (ref 8.4–10.4)
Chloride: 102 mEq/L (ref 98–109)
Creatinine: 1.1 mg/dL (ref 0.6–1.1)
Glucose: 162 mg/dl — ABNORMAL HIGH (ref 70–140)

## 2012-10-13 MED ORDER — TEMAZEPAM 15 MG PO CAPS: 15.0000 mg | ORAL_CAPSULE | Freq: Every evening | ORAL | Status: DC | PRN

## 2012-10-13 MED ORDER — LORAZEPAM 1 MG PO TABS: 1.0000 mg | ORAL_TABLET | Freq: Four times a day (QID) | ORAL | Status: DC | PRN

## 2012-10-13 MED ORDER — ZOLPIDEM TARTRATE 5 MG PO TABS: 5.0000 mg | ORAL_TABLET | Freq: Every evening | ORAL | Status: DC | PRN

## 2012-10-13 MED ORDER — HYDROCODONE-ACETAMINOPHEN 5-325 MG PO TABS: 1.0000 | ORAL_TABLET | Freq: Four times a day (QID) | ORAL | Status: DC | PRN

## 2012-10-13 MED ORDER — TEMAZEPAM 15 MG PO CAPS: 15.0000 mg | ORAL_CAPSULE | Freq: Every evening | ORAL | Status: AC | PRN

## 2012-10-13 NOTE — Telephone Encounter (Signed)
gv pt appt schedule for August. Per [place] although order given for 8/6 lb/fu ct should be as date for 8/11.

## 2012-10-13 NOTE — Progress Notes (Signed)
OFFICE PROGRESS NOTE  CC**  Kelli Mask, MD [address] [place] [address]  DIAGNOSIS: 72 year old female with stage IV invasive ductal carcinoma.  PRIOR THERAPY:  1.  History is of T1N3 invasive ductal left breast cancer diagnosed June 2002 with 13 nodes involved including extracapsular extension, ER + 67%, PR + 39%, HER-2/neu negative. She was treated with lumpectomy and 18 node axillary dissection, adjuvant adriamycin/cytoxan followed by [facility] chemotherapy, local radiation, tamoxifen from April 2003 thru Dec 2003 then arimidex from Dec 2004 thru June 2011. The arimidex was continued for that duration due to high risk features, stopped in June 2011 as there was no evidence of disease and with known osteoporosis.   2.  She developed acute pain in left hip in June 2013, with MRI hip 09-20-11 showing metastatic disease with pathologic fracture of femoral neck, nailing done by Dr Victorino Dike on 09-24-11. Pathology confirmed metastatic breast carcinoma, ER+ 100%, PR negative and HER 2 negative by [facility]. She had left ureteral stent by Dr Retta Diones 11-01-11 for obstructive hydronephrosis.   3.  She received radiation 3000 cGy in 10 fractions to left femur 7-24 thru 10-28-11 by Dr Roselind Messier. Bolero 4 study was started 11-12-11. Everolimus dose was reduced due to epistaxis. Restaging scans done per protocol in Dec 2013 (bone scan, CT chest, MRI abdomen and pelvis) all showed further improvement in metastatic disease, without other progression. Everolimus was discontinued after 03-03-12 due to dyspnea. Respiratory problems were evaluated with VQ, echocardiogram and bubble echo, and PFTs by Dr Craige Cotta, and the SOB progressively improved off of everolimus. She was continued on Letrozole and Zometa.  She had left ureteral stent changed by Dr Retta Diones on 07-22-2012. CA 2729 had been 354 in July 2013, down to 224 in Dec 2013 when everolimus DCd,   4.  CA 27-29 then was 299 in March 2014, 448 in April and was up  to 639 by labs drawn on 07-27-12. Letrozole was discontinued on 07-28-12 and first taxol given 08-12-12 (80 mg/m2), not tolerated well due to cytopenias. She is also on zometa given every 4 weeks.    5. She was started on Faslodex by Dr. Darrold Span on 09/14/12.    CURRENT THERAPY: Faslodex  INTERVAL HISTORY: Kelli [name] 72 y.o. female  returns today for close follow up.  She is c/o early satiety.  She is taking the lasix daily and hasn't noticed a change in her weight or swelling.  She is still occasionally constipated.  She would like to schedule another paracentesis next week to prevent the distended feeling.  She does endorse, that the paracenteses help for a few days, but then the distention and swelling re accumulates.  She is attempting to eat foods that are high in protein, however there isn't anything that she particularly cares for.  Otherwise, a 10 point ROS is negative.    MEDICAL HISTORY: Past Medical History  Diagnosis Date  . Thyroid disease IN 1982    HX IODINE-131 ABLATION FOR HYPERTHYROIDISM  . Osteoporosis   . Vitamin D insufficiency   . Chronic fatigue   . Depression   . Anxiety   . Insomnia   . History of chemotherapy     Adriamycin/cytoxan followed by [facility]  08/2000/tamoxifen 06/2001-02/2003,then arimidex 02/2003-08/2009  . 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 History of radiation therapy 12/28/00-02/12/01    left breast  With Dr. Jamie Kato  . Hypothyroidism   . Fatigue   . Short of breath on  exertion   . Breast cancer, left JUNE OF 2002    LEFT BREAST, ER/PR +, HER 2 -  . 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 Metastatic cancer to liver 09/24/11    CURRENTLY RECEIVING RADIATION  . Radiation 10/15/11-10/28/2011    left proximal leg  . Hydronephrosis, left     SECONDARY TO RETROPERITONEAL METS    ALLERGIES:  is allergic to cephalexin; contrast media; other; and sulfa antibiotics.  MEDICATIONS:  Current Outpatient Prescriptions   Medication Sig Dispense Refill  . dexamethasone (DECADRON) 4 MG tablet TAKE FIVE TABS WITH FOOD 12 HOURS AND 6 HOURS PRIOR TO TAXOL.  20 tablet  1  . diclofenac sodium (VOLTAREN) 1 % GEL Apply 2 g topically 2 (two) times daily as needed (for pain). Apply to knee      . docusate sodium (COLACE) 100 MG capsule Take 100 mg by mouth daily.      Marland Kitchen FLUoxetine (PROZAC) 20 MG capsule Take 20 mg by mouth daily at 12 noon.      . furosemide (LASIX) 20 MG tablet Take 1 tablet (20 mg total) by mouth daily.  30 tablet  0  . HYDROcodone-acetaminophen (NORCO/VICODIN) 5-325 MG per tablet Take 1 tablet by mouth every 6 (six) hours as needed for pain.  40 tablet  0  . HYDROmorphone (DILAUDID) 2 MG tablet Take 1 tablet (2 mg total) by mouth every 4 (four) hours as needed for pain.  10 tablet  0  . lactulose (CHRONULAC) 10 GM/15ML solution Take 15 mLs (10 g total) by mouth every 4 (four) hours.  240 mL  0  . levothyroxine (SYNTHROID, LEVOTHROID) 75 MCG tablet Take 75 mcg by mouth every morning.       . lidocaine-prilocaine (EMLA) cream Apply topically as needed. Apply to PortaCath site as directed 2 hours prior to access.  30 g  1  . LORazepam (ATIVAN) 1 MG tablet Take 1 tablet (1 mg total) by mouth every 6 (six) hours as needed (for nausea or to relax).  40 tablet  1  . ondansetron (ZOFRAN-ODT) 8 MG disintegrating tablet Take 8 mg by mouth every 12 (twelve) hours as needed for nausea.      . pantoprazole (PROTONIX) 40 MG tablet       . polyethylene glycol powder (MIRALAX) powder Take 17 g by mouth as needed.      . potassium chloride SA (K-DUR,KLOR-CON) 20 MEQ tablet Take 1 tablet (20 mEq total) by mouth 2 (two) times daily.  30 tablet  0  . Simethicone (GAS-X PO) Take 1 tablet by mouth daily as needed (for gas).       . sodium chloride (OCEAN) 0.65 % nasal spray Place 1 spray into the nose as needed (to keep nasal passages moist). Use every 1-2 hours while awake to keep nasal passages moist.  30 mL  12  .  sucralfate (CARAFATE) 1 G tablet Take 1 tablet (1 g total) by mouth 4 (four) times daily -  before meals and at bedtime. Can dissolve in water, for stomach  60 tablet  0  . temazepam (RESTORIL) 15 MG capsule Take 1 capsule (15 mg total) by mouth at bedtime as needed for sleep.  30 capsule  0  . zolpidem (AMBIEN) 5 MG tablet Take 1 tablet (5 mg total) by mouth at bedtime as needed for sleep.  30 tablet  0   No current facility-administered medications for this visit.  SURGICAL HISTORY:  Past Surgical History  Procedure Laterality Date  . Cholecystectomy  1970  . Femur im nail  09/24/2011     Procedure: INTRAMEDULLARY (IM) NAIL FEMORAL;  Surgeon: Toni Arthurs, MD;  Location: [facility];  Service: Orthopedics;  Laterality: Left;  FEMURAL NECK RX/ INTERTROCHANTERIC PATHOLOGIC FX  . Bone biopsy  09/25/2011    left medullary canal femur reaming=Metastatic Carcinoma  . Hysteroscopy w/d&c  01-11-2003    REMOVAL POLYP  . Left partial mastectomy / sentinel lymph node bx  08-28-2000    LEFT BREAST CANCER  . Cysto/ left retrograde pyleogram/  left ureteral stent placement  10-30-2011  . Cystoscopy w/ ureteral stent placement Left 07/22/2012    Procedure: CYSTOSCOPY WITH STENT REPLACEMENT;  Surgeon: Marcine Matar, MD;  Location: [facility];  Service: Urology;  Laterality: Left;    REVIEW OF SYSTEMS:  General: fatigue (-), night sweats (-), fever (-), pain (+) Lymph: palpable nodes (-) HEENT: vision changes (-), mucositis (-), gum bleeding (-), epistaxis (-) Cardiovascular: chest pain (-), palpitations (-) Pulmonary: shortness of breath (-), dyspnea on exertion (-), cough (-), hemoptysis (-) GI:  Early satiety (-), melena (-), dysphagia (-), nausea/vomiting (+), diarrhea (-) GU: dysuria (-), hematuria (-), incontinence (-) Musculoskeletal: joint swelling (-), joint pain (-), back pain (-) Neuro: weakness (-), numbness (-), headache (-), confusion (-) Skin: Rash (-), lesions (-), dryness  (-) Psych: depression (-), suicidal/homicidal ideation (-), feeling of hopelessness (-)   PHYSICAL EXAMINATION: Blood pressure 114/63, pulse 110, temperature 98.2 F (36.8 C), temperature source Oral, resp. rate 20, height 5\' 4"  (1.626 m), weight 147 lb 11.2 oz (66.996 kg). Body mass index is 25.34 kg/(m^2). General: Patient is a well appearing female in no acute distress HEENT: PERRLA, sclerae anicteric no conjunctival pallor, MMM Neck: supple, no palpable adenopathy Lungs: clear to auscultation bilaterally, no wheezes, rhonchi, or rales Cardiovascular: regular rate rhythm, S1, S2, no murmurs, rubs or gallops Abdomen: distended, non-tender, normoactive bowel sounds, no HSM Extremities: warm and well perfused, no clubbing, cyanosis, 2+ edema bilaterally Skin: No rashes or lesions Neuro: Non-focal ECOG PERFORMANCE STATUS: 3 - Symptomatic, >50% confined to bed  LABORATORY DATA: Lab Results  Component Value Date   WBC 13.3* 10/13/2012   HGB 9.7* 10/13/2012   HCT 29.3* 10/13/2012   MCV 109.6* 10/13/2012   PLT 114* 10/13/2012      Chemistry      Component Value Date/Time   NA 135* 10/13/2012 1312   NA 141 07/22/2012 0811   K 4.2 10/13/2012 1312   K 3.9 07/22/2012 0811   CL 109* 09/07/2012 1420   CL 102 06/29/2012 1302   CO2 23 10/13/2012 1312   CO2 26 06/29/2012 1302   BUN 13.6 10/13/2012 1312   BUN 14 06/29/2012 1302   CREATININE 1.1 10/13/2012 1312   CREATININE 0.81 06/29/2012 1302      Component Value Date/Time   CALCIUM 9.5 10/13/2012 1312   CALCIUM 10.0 06/29/2012 1302   ALKPHOS 133 10/13/2012 1312   ALKPHOS 99 06/29/2012 1302   AST 85* 10/13/2012 1312   AST 52* 06/29/2012 1302   ALT 17 10/13/2012 1312   ALT 27 06/29/2012 1302   BILITOT 3.35* 10/13/2012 1312   BILITOT 1.0 06/29/2012 1302       RADIOGRAPHIC STUDIES:  Dg Chest 2 View  09/01/2012   *RADIOLOGY REPORT*  Clinical Data: 72 year old female shortness of breath.  Heart palpitations.  Metastatic breast cancer.  CHEST - 2 VIEW  Comparison: 04/09/2012 and earlier.  Findings: Right chest IJ approach Port-A-Cath in place.  Catheter tip at the level of the lower SVC below the carina.  Partially visible left double-J ureteral stent, visualized portions stable. Small bilateral pleural effusions are new or increased.  Cardiac size and mediastinal contours are within normal limits. Postoperative changes to the chest wall and left axilla.  No pneumothorax or pulmonary edema.  No consolidation.  Stable mid thoracic mild compression fractures.  Stable heterogeneous bone mineralization compatible with known osseous metastatic disease.  IMPRESSION: 1.  New or increased small bilateral pleural effusions. 2.  Right chest Port-A-Cath in place with no adverse features evident. 3.  Left double-J ureteral stent partially re-identified.   Original Report Authenticated By: Erskine Speed, M.D.   US Abdomen Complete  09/21/2012   *RADIOLOGY REPORT*  Clinical Data:  Abdominal pain.  Metastatic breast cancer.  Left renal stent.  Post cholecystectomy  COMPLETE ABDOMINAL ULTRASOUND  Comparison:  Abdominal MRI on 02/27/2012  Findings:  Gallbladder:  Has been surgically removed  Common bile duct:  Has a diameter 7.2 mm.  This is within acceptable limits for a post cholecystectomy patient  Liver:  Two focal hepatic lesions are identified:  one target lesion in the medial segment of the right lobe of the liver measuring 3.0 x 2.4 x 3.0 cm and a smaller hypoechoic lesion identified in the left lobe of the liver measuring 1.1 x 0.9 x 1.0 cm. This would correspond with the patient's known metastatic disease.  No other discrete focal lesions are seen sonographically but overall hepatic echotexture is heterogeneous.  No intrahepatic ductal dilatation is seen  IVC:  The proximal portion appears normal  Pancreas:  The head and body are normal in size and echotexture. The tail is incompletely assessed  Spleen:  Has a sagittal length of 12.1 cm inhomogeneous pattern with no  focal lesions seen  Right Kidney:  Has a sagittal length of 9.9 cm.  No focal parenchymal abnormalities are seen and no signs of hydronephrosis are  Left Kidney:  Has a sagittal length of 10.6 cm.  A moderate degree of dilatation is seen.  A circular structure in the upper portion of the dilated pelvis likely represents the pelvis and of the of patient's indwelling stent. A shadowing echogenic focus in the lower pole calyceal system measuring 7 x 7 mm with posterior twinkle artifact shows no posterior shadowing but is suspicious for a non-obstructing stone.  Abdominal aorta:  Has a caliber of 2.1 cm with no aneurysmal dilatation  Other:  Small bilateral pleural effusions and moderate amount of fluid in the upper abdomen is noted.  IMPRESSION: Two focal areas in the liver sonographically would correlate with the patient's known hepatic metastatic disease, as described above.  Upper abdominal ascites and small bilateral pleural effusions.  Left renal hydronephrosis with probable lower pole nonobstructing calculus.   Original Report Authenticated By: Rhodia Albright, M.D.    ASSESSMENT:  Patient is a 72 year old female with   1. Stage IV ER positive, PR negative, HER-2/neu negative breast cancer metastatic to the bone and liver.  She was initially stage IIIC disease.  She then recurred in July 2013 and was found to have metastatic disease to the bones and liver.  She received radiation therapy and then began on the Bolero 4 trial.  She had a good response to therapy, however, had to discontinue the everolimus early due to shortness of breath.  She continued on Zometa and  Letrozole.  Letrozole was stopped in early May and she was started on weekly Taxol.  Taxol was discontinued due to cytopenias.  She was started on Faslodex on June 24/2014, and tolerated the first dose well.    2. She does have a moderate amount of ascites in her abdomen as noted on abdominal ultrasound on 09/21/12.   3.  Patient has problems  related to constipation, likely due to pain medications.      4. Volume overload.    5. Non-severe malnutrition   PLAN:  1. Patient will continue Faslodex and Zometa.  She will have CT chest/abdomen/pelvis August 11 after her next appointment.  She will require a steroid prep.    2. I ordered another ultrasound guided paracentesis to remove the free fluid from the abdomen if possible and alleviate the pain and abdominal distention.  This will be done on Monday.    3. The patient was recommended to take 1-2 stool softeners daily in addition to Miralax daily.       4. Mrs. Schalk's will stop taking lasix and potassium.    5.  I recommended she increase her protein intake.  We discussed a protein rich diet, and ensure/boost high protein.  We will discuss an appetite stimulant at her next appt.      6. We will see Mrs. Delission next on 10/27/12.    All questions were answered. The patient knows to call the clinic with any problems, questions or concerns. We can certainly see the patient much sooner if necessary.  I spent 40 minutes counseling the patient face to face. The total time spent in the appointment was 60 minutes.  Cherie Ouch Lyn Hollingshead, NP Medical Oncology [facility] Phone: [phone]    10/14/2012, 11:28 PM

## 2012-10-13 NOTE — Patient Instructions (Addendum)
Doing well.  You will return on August 6 for the Faslodex.  Take one to two stool softeners daily and Miralax daily.  I have arranged for a paracentesis on Monday 10/18/12.  We will order CT scans for August 11.  You will require a steroid prep.  We will discuss receiving steroids prior to the CT scan at your August 6 appointment.  Please call us if you have any questions or concerns.

## 2012-10-18 ENCOUNTER — Encounter (HOSPITAL_COMMUNITY): Payer: Self-pay

## 2012-10-18 ENCOUNTER — Inpatient Hospital Stay (HOSPITAL_COMMUNITY)
Admission: EM | Admit: 2012-10-18 | Discharge: 2012-10-22 | DRG: 394 | Disposition: A | Discharge status: Home Health | Payer: Medicare Other | Attending: Internal Medicine | Admitting: Internal Medicine

## 2012-10-18 ENCOUNTER — Ambulatory Visit (HOSPITAL_COMMUNITY): Admission: RE | Admit: 2012-10-18 | Payer: Medicare Other | Source: Ambulatory Visit

## 2012-10-18 ENCOUNTER — Telehealth: Payer: Self-pay | Admitting: Adult Health

## 2012-10-18 ENCOUNTER — Emergency Department (HOSPITAL_COMMUNITY): Payer: Medicare Other

## 2012-10-18 DIAGNOSIS — C50912 Malignant neoplasm of unspecified site of left female breast: Secondary | ICD-10-CM

## 2012-10-18 DIAGNOSIS — C50919 Malignant neoplasm of unspecified site of unspecified female breast: Secondary | ICD-10-CM | POA: Diagnosis present

## 2012-10-18 DIAGNOSIS — C7951 Secondary malignant neoplasm of bone: Secondary | ICD-10-CM

## 2012-10-18 DIAGNOSIS — C787 Secondary malignant neoplasm of liver and intrahepatic bile duct: Secondary | ICD-10-CM

## 2012-10-18 DIAGNOSIS — K644 Residual hemorrhoidal skin tags: Principal | ICD-10-CM | POA: Diagnosis present

## 2012-10-18 DIAGNOSIS — K625 Hemorrhage of anus and rectum: Secondary | ICD-10-CM

## 2012-10-18 DIAGNOSIS — G4700 Insomnia, unspecified: Secondary | ICD-10-CM | POA: Diagnosis present

## 2012-10-18 DIAGNOSIS — E440 Moderate protein-calorie malnutrition: Secondary | ICD-10-CM | POA: Diagnosis present

## 2012-10-18 DIAGNOSIS — R0902 Hypoxemia: Secondary | ICD-10-CM

## 2012-10-18 DIAGNOSIS — D539 Nutritional anemia, unspecified: Secondary | ICD-10-CM | POA: Diagnosis present

## 2012-10-18 DIAGNOSIS — M818 Other osteoporosis without current pathological fracture: Secondary | ICD-10-CM

## 2012-10-18 DIAGNOSIS — N390 Urinary tract infection, site not specified: Secondary | ICD-10-CM | POA: Diagnosis present

## 2012-10-18 DIAGNOSIS — D62 Acute posthemorrhagic anemia: Secondary | ICD-10-CM | POA: Diagnosis present

## 2012-10-18 DIAGNOSIS — K1230 Oral mucositis (ulcerative), unspecified: Secondary | ICD-10-CM

## 2012-10-18 DIAGNOSIS — D689 Coagulation defect, unspecified: Secondary | ICD-10-CM | POA: Diagnosis present

## 2012-10-18 DIAGNOSIS — N179 Acute kidney failure, unspecified: Secondary | ICD-10-CM | POA: Diagnosis present

## 2012-10-18 DIAGNOSIS — E559 Vitamin D deficiency, unspecified: Secondary | ICD-10-CM | POA: Diagnosis present

## 2012-10-18 DIAGNOSIS — C801 Malignant (primary) neoplasm, unspecified: Secondary | ICD-10-CM

## 2012-10-18 DIAGNOSIS — F411 Generalized anxiety disorder: Secondary | ICD-10-CM | POA: Diagnosis present

## 2012-10-18 DIAGNOSIS — C786 Secondary malignant neoplasm of retroperitoneum and peritoneum: Secondary | ICD-10-CM | POA: Diagnosis present

## 2012-10-18 DIAGNOSIS — T451X5A Adverse effect of antineoplastic and immunosuppressive drugs, initial encounter: Secondary | ICD-10-CM | POA: Diagnosis present

## 2012-10-18 DIAGNOSIS — R5381 Other malaise: Secondary | ICD-10-CM

## 2012-10-18 DIAGNOSIS — R180 Malignant ascites: Secondary | ICD-10-CM | POA: Diagnosis present

## 2012-10-18 DIAGNOSIS — Z9221 Personal history of antineoplastic chemotherapy: Secondary | ICD-10-CM

## 2012-10-18 DIAGNOSIS — K7460 Unspecified cirrhosis of liver: Secondary | ICD-10-CM | POA: Diagnosis present

## 2012-10-18 DIAGNOSIS — Z79899 Other long term (current) drug therapy: Secondary | ICD-10-CM

## 2012-10-18 DIAGNOSIS — R0600 Dyspnea, unspecified: Secondary | ICD-10-CM

## 2012-10-18 DIAGNOSIS — D649 Anemia, unspecified: Secondary | ICD-10-CM | POA: Diagnosis present

## 2012-10-18 DIAGNOSIS — F4321 Adjustment disorder with depressed mood: Secondary | ICD-10-CM | POA: Diagnosis present

## 2012-10-18 DIAGNOSIS — C799 Secondary malignant neoplasm of unspecified site: Secondary | ICD-10-CM

## 2012-10-18 DIAGNOSIS — E860 Dehydration: Secondary | ICD-10-CM

## 2012-10-18 DIAGNOSIS — C7952 Secondary malignant neoplasm of bone marrow: Secondary | ICD-10-CM | POA: Diagnosis present

## 2012-10-18 DIAGNOSIS — B9689 Other specified bacterial agents as the cause of diseases classified elsewhere: Secondary | ICD-10-CM | POA: Diagnosis present

## 2012-10-18 DIAGNOSIS — D72829 Elevated white blood cell count, unspecified: Secondary | ICD-10-CM | POA: Diagnosis present

## 2012-10-18 DIAGNOSIS — E039 Hypothyroidism, unspecified: Secondary | ICD-10-CM

## 2012-10-18 DIAGNOSIS — Z923 Personal history of irradiation: Secondary | ICD-10-CM

## 2012-10-18 DIAGNOSIS — K922 Gastrointestinal hemorrhage, unspecified: Secondary | ICD-10-CM | POA: Diagnosis present

## 2012-10-18 DIAGNOSIS — Z66 Do not resuscitate: Secondary | ICD-10-CM | POA: Diagnosis present

## 2012-10-18 LAB — URINALYSIS W MICROSCOPIC + REFLEX CULTURE
Ketones, ur: NEGATIVE mg/dL
Nitrite: NEGATIVE
Specific Gravity, Urine: 1.022 (ref 1.005–1.030)
Urobilinogen, UA: 1 mg/dL (ref 0.0–1.0)
pH: 5 (ref 5.0–8.0)

## 2012-10-18 LAB — CBC WITH DIFFERENTIAL/PLATELET
Basophils Absolute: 0 10*3/uL (ref 0.0–0.1)
Basophils Relative: 0 % (ref 0–1)
Eosinophils Absolute: 0.1 10*3/uL (ref 0.0–0.7)
Eosinophils Relative: 0 % (ref 0–5)
HCT: 19.2 % — ABNORMAL LOW (ref 36.0–46.0)
Hemoglobin: 6.3 g/dL — CL (ref 12.0–15.0)
MCH: 35.6 pg — ABNORMAL HIGH (ref 26.0–34.0)
MCHC: 32.8 g/dL (ref 30.0–36.0)
MCV: 108.5 fL — ABNORMAL HIGH (ref 78.0–100.0)
Monocytes Absolute: 1.9 10*3/uL — ABNORMAL HIGH (ref 0.1–1.0)
Monocytes Relative: 11 % (ref 3–12)
Neutro Abs: 10.5 10*3/uL — ABNORMAL HIGH (ref 1.7–7.7)
RDW: 20.2 % — ABNORMAL HIGH (ref 11.5–15.5)

## 2012-10-18 LAB — COMPREHENSIVE METABOLIC PANEL
AST: 78 U/L — ABNORMAL HIGH (ref 0–37)
BUN: 31 mg/dL — ABNORMAL HIGH (ref 6–23)
CO2: 23 mEq/L (ref 19–32)
Calcium: 10.3 mg/dL (ref 8.4–10.5)
Chloride: 98 mEq/L (ref 96–112)
Creatinine, Ser: 1.56 mg/dL — ABNORMAL HIGH (ref 0.50–1.10)
GFR calc Af Amer: 37 mL/min — ABNORMAL LOW (ref >90)
GFR calc non Af Amer: 32 mL/min — ABNORMAL LOW (ref >90)
Glucose, Bld: 129 mg/dL — ABNORMAL HIGH (ref 70–99)
Total Bilirubin: 3.2 mg/dL — ABNORMAL HIGH (ref 0.3–1.2)

## 2012-10-18 LAB — LACTIC ACID, PLASMA: Lactic Acid, Venous: 2 mmol/L (ref 0.5–2.2)

## 2012-10-18 LAB — MRSA PCR SCREENING: MRSA by PCR: NEGATIVE

## 2012-10-18 LAB — TROPONIN I: Troponin I: <0.3 ng/mL (ref <0.30)

## 2012-10-18 MED ORDER — ENOXAPARIN SODIUM 30 MG/0.3ML ~~LOC~~ SOLN
30.0000 mg | SUBCUTANEOUS | Status: DC
  Filled 2012-10-18: qty 0.3

## 2012-10-18 MED ORDER — SODIUM CHLORIDE 0.9 % IV BOLUS (SEPSIS)
250.0000 mL | Freq: Once | INTRAVENOUS | Status: AC
  Administered 2012-10-18: 250 mL via INTRAVENOUS

## 2012-10-18 MED ORDER — PHYTONADIONE 5 MG PO TABS
5.0000 mg | ORAL_TABLET | Freq: Once | ORAL | Status: AC
  Administered 2012-10-18: 5 mg via ORAL
  Filled 2012-10-18: qty 1

## 2012-10-18 MED ORDER — DOCUSATE SODIUM 100 MG PO CAPS
100.0000 mg | ORAL_CAPSULE | Freq: Every day | ORAL | Status: DC
  Administered 2012-10-18 – 2012-10-22 (×5): 100 mg via ORAL
  Filled 2012-10-18 (×5): qty 1

## 2012-10-18 MED ORDER — HYDROCODONE-ACETAMINOPHEN 5-325 MG PO TABS
1.0000 | ORAL_TABLET | Freq: Four times a day (QID) | ORAL | Status: DC | PRN
  Administered 2012-10-19 – 2012-10-21 (×3): 1 via ORAL
  Filled 2012-10-18 (×3): qty 1

## 2012-10-18 MED ORDER — HYDROCORTISONE ACETATE 25 MG RE SUPP: 25.0000 mg | Freq: Two times a day (BID) | RECTAL | Status: DC

## 2012-10-18 MED ORDER — PANTOPRAZOLE SODIUM 40 MG PO TBEC
40.0000 mg | DELAYED_RELEASE_TABLET | Freq: Every day | ORAL | Status: DC
  Administered 2012-10-18 – 2012-10-22 (×5): 40 mg via ORAL
  Filled 2012-10-18 (×5): qty 1

## 2012-10-18 MED ORDER — FUROSEMIDE 10 MG/ML IJ SOLN
20.0000 mg | Freq: Once | INTRAMUSCULAR | Status: AC
  Administered 2012-10-19: 20 mg via INTRAVENOUS
  Filled 2012-10-18: qty 2

## 2012-10-18 MED ORDER — LORAZEPAM 1 MG PO TABS
1.0000 mg | ORAL_TABLET | Freq: Four times a day (QID) | ORAL | Status: DC | PRN
  Administered 2012-10-21: 1 mg via ORAL
  Filled 2012-10-18: qty 1

## 2012-10-18 MED ORDER — TEMAZEPAM 15 MG PO CAPS: 15.0000 mg | ORAL_CAPSULE | Freq: Every evening | ORAL | Status: DC | PRN

## 2012-10-18 MED ORDER — HYDROMORPHONE HCL 2 MG PO TABS: 2.0000 mg | ORAL_TABLET | ORAL | Status: DC | PRN

## 2012-10-18 MED ORDER — POLYETHYLENE GLYCOL 3350 17 GM/SCOOP PO POWD
17.0000 g | ORAL | Status: DC | PRN
  Filled 2012-10-18: qty 255

## 2012-10-18 MED ORDER — HYDROCORTISONE ACETATE 25 MG RE SUPP
25.0000 mg | Freq: Two times a day (BID) | RECTAL | Status: DC
  Administered 2012-10-18 – 2012-10-22 (×7): 25 mg via RECTAL
  Filled 2012-10-18 (×10): qty 1

## 2012-10-18 MED ORDER — SODIUM CHLORIDE 0.9 % IV SOLN
INTRAVENOUS | Status: DC
  Administered 2012-10-18 – 2012-10-19 (×3): via INTRAVENOUS

## 2012-10-18 MED ORDER — ZOLPIDEM TARTRATE 5 MG PO TABS
5.0000 mg | ORAL_TABLET | Freq: Every evening | ORAL | Status: DC | PRN
  Administered 2012-10-18 – 2012-10-21 (×3): 5 mg via ORAL
  Filled 2012-10-18 (×3): qty 1

## 2012-10-18 MED ORDER — SODIUM CHLORIDE 0.9 % IJ SOLN
3.0000 mL | Freq: Two times a day (BID) | INTRAMUSCULAR | Status: DC
  Administered 2012-10-18 – 2012-10-21 (×2): 3 mL via INTRAVENOUS

## 2012-10-18 MED ORDER — LEVOTHYROXINE SODIUM 75 MCG PO TABS
75.0000 ug | ORAL_TABLET | Freq: Every day | ORAL | Status: DC
  Administered 2012-10-19 – 2012-10-22 (×4): 75 ug via ORAL
  Filled 2012-10-18 (×5): qty 1

## 2012-10-18 MED ORDER — FUROSEMIDE 10 MG/ML IJ SOLN
20.0000 mg | Freq: Once | INTRAMUSCULAR | Status: AC
  Administered 2012-10-18: 20 mg via INTRAVENOUS

## 2012-10-18 MED ORDER — DIPHENHYDRAMINE HCL 25 MG PO CAPS
25.0000 mg | ORAL_CAPSULE | Freq: Once | ORAL | Status: AC
  Administered 2012-10-18: 25 mg via ORAL
  Filled 2012-10-18: qty 1

## 2012-10-18 MED ORDER — SUCRALFATE 1 G PO TABS
1.0000 g | ORAL_TABLET | Freq: Three times a day (TID) | ORAL | Status: DC
  Administered 2012-10-18 – 2012-10-22 (×15): 1 g via ORAL
  Filled 2012-10-18 (×18): qty 1

## 2012-10-18 MED ORDER — POLYETHYLENE GLYCOL 3350 17 G PO PACK
17.0000 g | PACK | Freq: Every day | ORAL | Status: DC | PRN
  Filled 2012-10-18: qty 1

## 2012-10-18 MED ORDER — DICLOFENAC SODIUM 1 % TD GEL: 2.0000 g | Freq: Two times a day (BID) | TRANSDERMAL | Status: DC | PRN

## 2012-10-18 MED ORDER — FUROSEMIDE 10 MG/ML IJ SOLN: 20.0000 mg | Freq: Once | INTRAMUSCULAR | Status: DC

## 2012-10-18 MED ORDER — HYDROMORPHONE HCL PF 1 MG/ML IJ SOLN: 0.5000 mg | INTRAMUSCULAR | Status: DC | PRN

## 2012-10-18 MED ORDER — SODIUM CHLORIDE 0.9 % IV SOLN
INTRAVENOUS | Status: AC
  Administered 2012-10-18: 75 mL via INTRAVENOUS

## 2012-10-18 MED ORDER — FLUOXETINE HCL 20 MG PO CAPS
20.0000 mg | ORAL_CAPSULE | Freq: Every day | ORAL | Status: DC
  Administered 2012-10-19 – 2012-10-22 (×4): 20 mg via ORAL
  Filled 2012-10-18 (×4): qty 1

## 2012-10-18 NOTE — ED Notes (Signed)
Pt, from home, c/o rectal bleeding x 2 days, abdominal pain and weakness x 4 days.  Pt sts "I havent had a good bowel movement in a while and I've been straining."  Husband sts blood was bright red.  Pain score 5/10.  Husband, also, sts Pt was supposed to have paracentesis today, but her PCP told her to come to ED instead, due to bleeding.  Last CA treament(Falodex) was July 2nd and last paracentesis was 7/17.

## 2012-10-18 NOTE — Telephone Encounter (Signed)
Spoke with Kelli [name]'s husband on the phone.  He is reporting that the patient has been experiencing blood in her stool, and it has been large volume and one episode was in the floor since this past Saturday.  Encouraged the patient to come to the emergency room for evaluation of a GI bleed.  Patient and husband initially refused, stating that insurance did not cover emergency room visits.  I then again recommended she go to the emergency room reminding the patient and her husband that she has had difficulty with her bowels for several weeks now, and if she is bleeding as severely as they stated, she needs to be evaluated emergently.  They agreed, and will proceed to the emergency room.

## 2012-10-18 NOTE — ED Notes (Signed)
X-ray at bedside

## 2012-10-18 NOTE — H&P (Addendum)
[facility] History and Physical  Kelli [name] WUJ:[id number] DOB: 1940-12-19 DOA: 10/18/2012  Referring physician: Alwyn Pea PCP: [name], MD  Specialists: Solon [place], Sh[place]oler  Chief Complaint: Bleeding  HPI: Kelli [name] is a 72 y.o. 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 She was treated with lumpectomy and 18 node axillary dissection, adjuvant adriamycin/cytoxan followed by [facility] chemotherapy, local radiation (please see full details from Ms. Mardelle Matte last OV) Husband braought her to ED 7/28 with Rectal bleeding that began 7/25, about 3-4 episodes per patinet.  It seemed ot worsen 7/29-was BRB per rectum, no mixture of this and stool-no known hemorrhoidal disease  Has been feeling weak as well and has had a hard time gettign a stool out for the last 4-5 days and is constipated for this-she has trialed miralx and colace.   Has not really been up and about therefore cannot really tell me if she is dizzy.   She had a Doc appt on Wednesday 7/23 and was fine- No fever, but has had some diarrhea-this has been on and off-and seems to alternate with normal stool vs consitpation. She has had 2 paracentesis x 2 already-this seems to be part of the progression of her Metastatic disease   Patient has never been told that. She has hemorrhoids, or has had any issues with bleeding before-she has had a negative colonoscopy about 3-4 years go by [facility] gastroenterology-details of this are unclear at present.  Review of Systems: The patient denies chest pain, nausea, vomiting, hematemesis, shortness of breath, blurred vision, double vision, weakness on one side or the-she has felt somewhat dizzy. Has not really been up out of bed. No new rash. No specific chills, or fevers. No abdominal pain. No nausea, although she has occasionally   Past Medical  History  Diagnosis Date  . Thyroid disease IN 1982    HX IODINE-131 ABLATION FOR HYPERTHYROIDISM  . Osteoporosis   . Vitamin D insufficiency   . Chronic fatigue   . Depression   . Anxiety   . Insomnia   . History of chemotherapy     Adriamycin/cytoxan followed by [facility]  08/2000/tamoxifen 06/2001-02/2003,then arimidex 02/2003-08/2009  . 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 History of radiation therapy 12/28/00-02/12/01    left breast  With Dr. Jamie Kato  . Hypothyroidism   . Fatigue   . Short of breath on exertion   . Breast cancer, left JUNE OF 2002    LEFT BREAST, ER/PR +, HER 2 -  . 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 Metastatic cancer to liver 09/24/11    CURRENTLY RECEIVING RADIATION  . Radiation 10/15/11-10/28/2011    left proximal leg  . Hydronephrosis, left     SECONDARY TO RETROPERITONEAL METS   Admission 07/22/12 for Malignant hydronephrosis L kidney Admission 09/24/11 for PAthologic # L Femoral   Past Surgical History  Procedure Laterality Date  . Cholecystectomy  1970  . Femur im nail  09/24/2011     Procedure: INTRAMEDULLARY (IM) NAIL FEMORAL;  Surgeon: Toni Arthurs, MD;  Location: [facility];  Service: Orthopedics;  Laterality: Left;  FEMURAL NECK RX/ INTERTROCHANTERIC PATHOLOGIC FX  . Bone biopsy  09/25/2011    left medullary canal femur reaming=Metastatic Carcinoma  . Hysteroscopy w/d&c  01-11-2003    REMOVAL POLYP  . Left partial mastectomy / sentinel lymph  node bx  08-28-2000    LEFT BREAST CANCER  . Cysto/ left retrograde pyleogram/  left ureteral stent placement  10-30-2011  . Cystoscopy w/ ureteral stent placement Left 07/22/2012    Procedure: CYSTOSCOPY WITH STENT REPLACEMENT;  Surgeon: Marcine Matar, MD;  Location: [facility];  Service: Urology;  Laterality: Left;   Social History:  reports that she has never smoked. She has never used smokeless tobacco. She reports that she does not drink alcohol or use illicit  drugs. Lives at home with husband originally from [place]. 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 Cephalexin Nausea Only  . Contrast Media (Iodinated Diagnostic Agents) Rash    GIVE BENADRYL PRIOR  . Other Nausea And Vomiting and Rash    "MYCIN" DRUGS  . Sulfa Antibiotics Nausea And Vomiting and Rash    Family History  Problem Relation Age of Onset  . Cancer Mother     breast  . Heart attack Father   . CVA Sister   . Dementia Brother      Prior to Admission medications   Medication Sig Start Date End Date Taking? Authorizing Provider  diclofenac sodium (VOLTAREN) 1 % GEL Apply 2 g topically 2 (two) times daily as needed (for pain). Apply to knee 03/09/12  Yes Lennis Buzzy Han, MD  docusate sodium (COLACE) 100 MG capsule Take 100 mg by mouth daily.   Yes Historical Provider, MD  FLUoxetine (PROZAC) 20 MG capsule Take 20 mg by mouth daily at 12 noon.   Yes Historical Provider, MD  fulvestrant (FASLODEX) 250 MG/5ML injection Inject 250 mg into the muscle every 30 (thirty) days. One injection each buttock over 1-2 minutes. Warm prior to use.   Yes Historical Provider, MD  HYDROcodone-acetaminophen (NORCO/VICODIN) 5-325 MG per tablet Take 1 tablet by mouth every 6 (six) hours as needed for pain. 10/13/12  Yes Augustin Schooling, NP  HYDROmorphone (DILAUDID) 2 MG tablet Take 1 tablet (2 mg total) by mouth every 4 (four) hours as needed for pain. 09/17/12  Yes Lennis Buzzy Han, MD  levothyroxine (SYNTHROID, LEVOTHROID) 75 MCG tablet Take 75 mcg by mouth every morning.    Yes Historical Provider, MD  lidocaine-prilocaine (EMLA) cream Apply topically as needed. Apply to PortaCath site as directed 2 hours prior to access. 08/06/12  Yes Lennis Buzzy Han, MD  LORazepam (ATIVAN) 1 MG tablet Take 1 tablet (1 mg total) by mouth every 6 (six) hours as needed (for nausea or to relax). 10/13/12  Yes Augustin Schooling, NP   ondansetron (ZOFRAN-ODT) 8 MG disintegrating tablet Take 8 mg by mouth every 12 (twelve) hours as needed for nausea.   Yes Historical Provider, MD  pantoprazole (PROTONIX) 40 MG tablet  07/27/12  Yes Historical Provider, MD  polyethylene glycol powder (MIRALAX) powder Take 17 g by mouth as needed.   Yes Historical Provider, MD  Simethicone (GAS-X PO) Take 1 tablet by mouth daily as needed (for gas).    Yes Historical Provider, MD  sodium chloride (OCEAN) 0.65 % nasal spray Place 1 spray into the nose as needed (to keep nasal passages moist). Use every 1-2 hours while awake to keep nasal passages moist. 12/02/11  Yes Lennis Buzzy Han, MD  sucralfate (CARAFATE) 1 G tablet Take 1 tablet (1 g total) by mouth 4 (four) times daily -  before meals and at bedtime. Can dissolve in water, for stomach 08/24/12  Yes Lennis Buzzy Han, MD  temazepam (RESTORIL) 15 MG capsule Take 1 capsule (15 mg total) by mouth at bedtime as needed for sleep. 10/13/12  Yes Augustin Schooling, NP  zolendronic acid (ZOMETA) 4 MG/5ML injection Inject 4 mg into the vein once.   Yes Historical Provider, MD  zolpidem (AMBIEN) 5 MG tablet Take 1 tablet (5 mg total) by mouth at bedtime as needed for sleep. 10/13/12  Yes Augustin Schooling, NP  furosemide (LASIX) 20 MG tablet Take 1 tablet (20 mg total) by mouth daily. 10/04/12   Augustin Schooling, NP   Physical Exam: Filed Vitals:   10/18/12 1410 10/18/12 1517 10/18/12 1625  BP: 99/40  111/40  Pulse: 108  109  Temp: 98.5 F (36.9 C) 98.1 F (36.7 C)   TempSrc: Oral Rectal   Resp: 17  16  SpO2: 95%  94%     General:  Frail Caucasian female, no apparent distress  Eyes: EOMI, mild pallor  ENT: False teeth noted top and bottom. Soft, supple neck  Neck: No thyromegaly, no JVD, no bruit  Cardiovascular: S1, S2, mildly tachycardic  Respiratory: Clinically clear  Abdomen: Soft, nontender, somewhat distended-rectal exam deferred, however, grossly prolapsed external thrombosed  hemorrhoids. No noted bleeding, Hemoccult per EDP was negative  Skin: Lower extremities have grade 3 edema  Musculoskeletal: Range of motion intact  Psychiatric: Flat affect, slow to speak  Neurologic: Grossly intact, moving all 4 limbs equally  Labs on Admission:  Basic Metabolic Panel:  Recent Labs Lab 10/13/12 1312 10/18/12 1616  NA 135* 129*  K 4.2 5.2*  CL  --  98  CO2 23 23  GLUCOSE 162* 129*  BUN 13.6 31*  CREATININE 1.1 1.56*  CALCIUM 9.5 10.3   Liver Function Tests:  Recent Labs Lab 10/13/12 1312 10/18/12 1616  AST 85* 78*  ALT 17 16  ALKPHOS 133 128*  BILITOT 3.35* 3.2*  PROT 5.3* 5.0*  ALBUMIN 2.6* 2.4*    Recent Labs Lab 10/18/12 1616  LIPASE 32   No results found for this basename: AMMONIA,  in the last 168 hours CBC:  Recent Labs Lab 10/13/12 1312 10/18/12 1616  WBC 13.3* 16.3*  NEUTROABS 8.3* 10.5*  HGB 9.7* 6.3*  HCT 29.3* 19.2*  MCV 109.6* 108.5*  PLT 114* 173   Cardiac Enzymes:  Recent Labs Lab 10/18/12 1616  TROPONINI <0.30    BNP (last 3 results) No results found for this basename: PROBNP,  in the last 8760 hours CBG: No results found for this basename: GLUCAP,  in the last 168 hours  Radiological Exams on Admission: Dg Chest Port 1 View  10/18/2012   *RADIOLOGY REPORT*  Clinical Data: Weakness, rectal bleeding  PORTABLE CHEST - 1 VIEW  Comparison: 09/01/2012  Findings: Cardiomediastinal silhouette is stable.  Study is limited by poor inspiration.  Surgical clips in left axilla again noted. No acute infiltrate or pulmonary edema.  Old fracture of the right seventh rib. There is a right IJ Port-A-Cath with tip in SVC right atrium junction.  No diagnostic pneumothorax.  IMPRESSION:  Study is limited by poor inspiration.  Surgical clips in left axilla again noted. No acute infiltrate or pulmonary edema.  Old fracture of the right seventh rib. There is a right IJ Port-A-Cath with tip in SVC right atrium junction.  No diagnostic  pneumothorax.   Original Report Authenticated By: Natasha Mead, M.D.    EKG: Independently reviewed. 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 Acute lower GI bleed, likely secondary to hemorrhoids-I cannot find reports of her prior colonoscopy.Paged [facility] gastroenterology for recommendations/reports.  Colonoscopy in 2007 was neg per Dr. Marge Duncans report. 2. Borderline hypotension-Monitor-could be 2/2 to vol depletion and ABLA-Will continue IVF 75 cc/hr.  Lasix on hold for ascites 3. Hemorrhoids-Start Anusol 25 mg bid-GI states no procedure they would do.  Have called Dr. Michaell Cowing of Gen surg for an opinion-states avoid enemas/suppository.    Patient is not really the greatest candidate for surgery.  Recommends to re-consult [if he doesn;t see her today] in the am if bleeding doesn't stop-greatly appreciate input 4. Acute blood loss anemia in a setting of macrocytic anemia, probably from hemorrhoids, superimposed upon cancer/chemotherapy-patient will be transfused at least 2-3 units. Her baseline hemoglobin is 9.7 as of 10/13/2012 5. Leukocytosis-potentially stress response-however, I see that she has a baseline leukocytosis for over the past month. Monitor 6. Known T1 N3 invasive breast cancer-please consult Dr. Darrold Span in a.m. as a courtesy 7. Malignant ascites?-Was scheduled for her paracentesis 7/28/141will place order for interval performance of paracentesis-this should be guided by oncologist-note that she has been discontinued off of her Lasix. 8. H/o malignant hydronephrosis-Follow up OP with Dr. Retta Diones, Urology-nothing significant to this admission 9. Significant constipation alt with diarrhea-continue bowel regimen-careful with opiates 10. Osteoporosis 2/2 to aromatase  inhibitor-hold zometa fro now 11. Hypothyroid-continue Syntrhoid 75 mcg daily 12. Situational depression-cont Prozac 20 daily, Restoril 15 mg daily    See above if consultant consulted, please document name and whether formally or informally consulted  Code Status: DNR  Family Communication: spoke with husband at bedside  Disposition Plan: Inpatient, SDU 3-4 days  Time spent: 73  Mahala Menghini [facility] Pager [phone]  If 7PM-7AM, please contact night-coverage [email or web address] Password [facility] 10/18/2012, 5:59 PM

## 2012-10-18 NOTE — ED Provider Notes (Signed)
CSN: [id number]     Arrival date & time 10/18/12  1344 History     First MD Initiated Contact with Patient 10/18/12 1506     Chief Complaint  Patient presents with  . Rectal Bleeding  . Weakness   HPI Pt was seen at 1510.  Per pt, c/o gradual onset and persistence of constant generalized weakness for the past 4 days. Has been associated with intermittent rectal bleeding for the past 4 days. States she noticed the bleeding after "straining to have a BM." Denies rectal pain. Pt is due for paracentesis today for abd distention and "aching" pain.  Denies N/V, no fevers, no rash, no back pain, no CP/SOB.     Past Medical History  Diagnosis Date  . Thyroid disease IN 1982    HX IODINE-131 ABLATION FOR HYPERTHYROIDISM  . Osteoporosis   . Vitamin D insufficiency   . Chronic fatigue   . Depression   . Anxiety   . Insomnia   . History of chemotherapy     Adriamycin/cytoxan followed by [facility]  08/2000/tamoxifen 06/2001-02/2003,then arimidex 02/2003-08/2009  . 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 History of radiation therapy 12/28/00-02/12/01    left breast  With Dr. Jamie Kato  . Hypothyroidism   . Fatigue   . Short of breath on exertion   . Breast cancer, left JUNE OF 2002    LEFT BREAST, ER/PR +, HER 2 -  . 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 Metastatic cancer to liver 09/24/11    CURRENTLY RECEIVING RADIATION  . Radiation 10/15/11-10/28/2011    left proximal leg  . Hydronephrosis, left     SECONDARY TO RETROPERITONEAL METS   Past Surgical History  Procedure Laterality Date  . Cholecystectomy  1970  . Femur im nail  09/24/2011     Procedure: INTRAMEDULLARY (IM) NAIL FEMORAL;  Surgeon: Toni Arthurs, MD;  Location: [facility];  Service: Orthopedics;  Laterality: Left;  FEMURAL NECK RX/ INTERTROCHANTERIC PATHOLOGIC FX  . Bone biopsy  09/25/2011    left medullary canal femur reaming=Metastatic Carcinoma  . Hysteroscopy w/d&c  01-11-2003    REMOVAL POLYP  .  Left partial mastectomy / sentinel lymph node bx  08-28-2000    LEFT BREAST CANCER  . Cysto/ left retrograde pyleogram/  left ureteral stent placement  10-30-2011  . Cystoscopy w/ ureteral stent placement Left 07/22/2012    Procedure: CYSTOSCOPY WITH STENT REPLACEMENT;  Surgeon: Marcine Matar, MD;  Location: [facility];  Service: Urology;  Laterality: Left;    History  Substance Use Topics  . Smoking status: Never Smoker   . Smokeless tobacco: Never Used  . Alcohol Use: No    Review of Systems ROS: Statement: All systems negative except as marked or noted in the HPI; Constitutional: Negative for fever and chills. +generalized weakness/fatigue.; ; Eyes: Negative for eye pain, redness and discharge. ; ; ENMT: Negative for ear pain, hoarseness, nasal congestion, sinus pressure and sore throat. ; ; Cardiovascular: Negative for chest pain, palpitations, diaphoresis, dyspnea and peripheral edema. ; ; Respiratory: Negative for cough, wheezing and stridor. ; ; Gastrointestinal: Negative for nausea, vomiting, diarrhea, hematemesis, jaundice and +abd pain, rectal bleeding. . ; ; Genitourinary: Negative for dysuria, flank pain and hematuria. ; ; Musculoskeletal: Negative for back pain and neck pain. Negative for swelling and trauma.; ; Skin: Negative for pruritus, rash, abrasions, blisters, bruising and skin lesion.; ; Neuro: Negative for headache, lightheadedness and neck stiffness. Negative  for weakness, altered level of consciousness , altered mental status, extremity weakness, paresthesias, involuntary movement, seizure and syncope.      Allergies  Cephalexin; Contrast media; Other; and Sulfa antibiotics  Home Medications   Current Outpatient Rx  Name  Route  Sig  Dispense  Refill  . diclofenac sodium (VOLTAREN) 1 % GEL   Topical   Apply 2 g topically 2 (two) times daily as needed (for pain). Apply to knee         . docusate sodium (COLACE) 100 MG capsule   Oral   Take  100 mg by mouth daily.         Marland Kitchen FLUoxetine (PROZAC) 20 MG capsule   Oral   Take 20 mg by mouth daily at 12 noon.         . fulvestrant (FASLODEX) 250 MG/5ML injection   Intramuscular   Inject 250 mg into the muscle every 30 (thirty) days. One injection each buttock over 1-2 minutes. Warm prior to use.         Marland Kitchen HYDROcodone-acetaminophen (NORCO/VICODIN) 5-325 MG per tablet   Oral   Take 1 tablet by mouth every 6 (six) hours as needed for pain.   40 tablet   0   . HYDROmorphone (DILAUDID) 2 MG tablet   Oral   Take 1 tablet (2 mg total) by mouth every 4 (four) hours as needed for pain.   10 tablet   0   . levothyroxine (SYNTHROID, LEVOTHROID) 75 MCG tablet   Oral   Take 75 mcg by mouth every morning.          . lidocaine-prilocaine (EMLA) cream   Topical   Apply topically as needed. Apply to PortaCath site as directed 2 hours prior to access.   30 g   1   . LORazepam (ATIVAN) 1 MG tablet   Oral   Take 1 tablet (1 mg total) by mouth every 6 (six) hours as needed (for nausea or to relax).   40 tablet   1   . ondansetron (ZOFRAN-ODT) 8 MG disintegrating tablet   Oral   Take 8 mg by mouth every 12 (twelve) hours as needed for nausea.         . pantoprazole (PROTONIX) 40 MG tablet               . polyethylene glycol powder (MIRALAX) powder   Oral   Take 17 g by mouth as needed.         . Simethicone (GAS-X PO)   Oral   Take 1 tablet by mouth daily as needed (for gas).          . sodium chloride (OCEAN) 0.65 % nasal spray   Nasal   Place 1 spray into the nose as needed (to keep nasal passages moist). Use every 1-2 hours while awake to keep nasal passages moist.   30 mL   12   . sucralfate (CARAFATE) 1 G tablet   Oral   Take 1 tablet (1 g total) by mouth 4 (four) times daily -  before meals and at bedtime. Can dissolve in water, for stomach   60 tablet   0   . temazepam (RESTORIL) 15 MG capsule   Oral   Take 1 capsule (15 mg total) by mouth  at bedtime as needed for sleep.   30 capsule   0   . zolendronic acid (ZOMETA) 4 MG/5ML injection   Intravenous   Inject 4 mg into  the vein once.         Marland Kitchen zolpidem (AMBIEN) 5 MG tablet   Oral   Take 1 tablet (5 mg total) by mouth at bedtime as needed for sleep.   30 tablet   0   . furosemide (LASIX) 20 MG tablet   Oral   Take 1 tablet (20 mg total) by mouth daily.   30 tablet   0    BP 99/40  Pulse 108  Temp(Src) 98.1 F (36.7 C) (Rectal)  Resp 17  SpO2 95%  Filed Vitals:   10/18/12 1410 10/18/12 1517 10/18/12 1625  BP: 99/40  111/40  Pulse: 108  109  Temp: 98.5 F (36.9 C) 98.1 F (36.7 C)   TempSrc: Oral Rectal   Resp: 17  16  SpO2: 95%  94%     Physical Exam 1515: Physical examination:  Nursing notes reviewed; Vital signs and O2 SAT reviewed;  Constitutional: Thin, frail, In no acute distress; Head:  Normocephalic, atraumatic; Eyes: EOMI, PERRL, No scleral icterus; ENMT: Mouth and pharynx normal, Mucous membranes dry; Neck: Supple, Full range of motion, No lymphadenopathy; Cardiovascular: Tachycardic rate and rhythm, No gallop; Respiratory: Breath sounds clear & equal bilaterally, No wheezes.  Speaking full sentences with ease, Normal respiratory effort/excursion; Chest: Nontender, Movement normal; Abdomen: Soft, +mild diffuse tenderness to palp. Nondistended, Normal bowel sounds. Rectal exam performed w/permission of pt and ED RN chaperone present.  Anal tone normal.  Non-tender, soft brown stool in rectal vault, heme neg.  No fissures, +multiple external hemorrhoids without active bleeding. No palp masses.;; Genitourinary: No CVA tenderness; Extremities: Pulses normal, No tenderness, No edema, No calf edema or asymmetry.; Neuro: AA&Ox3, Major CN grossly intact.  Speech clear. No gross focal motor or sensory deficits in extremities.; Skin: Color pale, Warm, Dry.   ED Course   Procedures   MDM  MDM Reviewed: previous chart, nursing note and vitals Reviewed  previous: labs and ECG Interpretation: labs, ECG and x-ray Total time providing critical care: 30-74 minutes. This excludes time spent performing separately reportable procedures and services. Consults: admitting MD   CRITICAL CARE Performed by: Laray Anger Total critical care time: 31 Critical care time was exclusive of separately billable procedures and treating other patients. Critical care was necessary to treat or prevent imminent or life-threatening deterioration. Critical care was time spent personally by me on the following activities: development of treatment plan with patient and/or surrogate as well as nursing, discussions with consultants, evaluation of patient's response to treatment, examination of patient, obtaining history from patient or surrogate, ordering and performing treatments and interventions, ordering and review of laboratory studies, ordering and review of radiographic studies, pulse oximetry and re-evaluation of patient's condition.    Date: 10/18/2012  Rate: 108  Rhythm: sinus tachycardia  QRS Axis: normal  Intervals: normal  ST/T Wave abnormalities: normal  Conduction Disutrbances:none  Narrative Interpretation:   Old EKG Reviewed: unchanged; no significant changes from previous EKG dated 11/06/2011.  Results for orders placed during the hospital encounter of 10/18/12  CBC WITH DIFFERENTIAL      Result Value Range   WBC 16.3 (*) 4.0 - 10.5 K/uL   RBC 1.77 (*) 3.87 - 5.11 MIL/uL   Hemoglobin 6.3 (*) 12.0 - 15.0 g/dL   HCT 45.4 (*) 09.8 - 11.9 %   MCV 108.5 (*) 78.0 - 100.0 fL   MCH 35.6 (*) 26.0 - 34.0 pg   MCHC 32.8  30.0 - 36.0 g/dL   RDW 14.7 (*) 82.9 -  15.5 %   Platelets 173  150 - 400 K/uL   Neutrophils Relative % 64  43 - 77 %   Neutro Abs 10.5 (*) 1.7 - 7.7 K/uL   Lymphocytes Relative 24  12 - 46 %   Lymphs Abs 3.9  0.7 - 4.0 K/uL   Monocytes Relative 11  3 - 12 %   Monocytes Absolute 1.9 (*) 0.1 - 1.0 K/uL   Eosinophils Relative 0   0 - 5 %   Eosinophils Absolute 0.1  0.0 - 0.7 K/uL   Basophils Relative 0  0 - 1 %   Basophils Absolute 0.0  0.0 - 0.1 K/uL  PROTIME-INR      Result Value Range   Prothrombin Time 22.1 (*) 11.6 - 15.2 seconds   INR 2.00 (*) 0.00 - 1.49  APTT      Result Value Range   aPTT 46 (*) 24 - 37 seconds  COMPREHENSIVE METABOLIC PANEL      Result Value Range   Sodium 129 (*) 135 - 145 mEq/L   Potassium 5.2 (*) 3.5 - 5.1 mEq/L   Chloride 98  96 - 112 mEq/L   CO2 23  19 - 32 mEq/L   Glucose, Bld 129 (*) 70 - 99 mg/dL   BUN 31 (*) 6 - 23 mg/dL   Creatinine, Ser 1.61 (*) 0.50 - 1.10 mg/dL   Calcium 09.6  8.4 - 04.5 mg/dL   Total Protein 5.0 (*) 6.0 - 8.3 g/dL   Albumin 2.4 (*) 3.5 - 5.2 g/dL   AST 78 (*) 0 - 37 U/L   ALT 16  0 - 35 U/L   Alkaline Phosphatase 128 (*) 39 - 117 U/L   Total Bilirubin 3.2 (*) 0.3 - 1.2 mg/dL   GFR calc non Af Amer 32 (*) >90 mL/min   GFR calc Af Amer 37 (*) >90 mL/min  LIPASE, BLOOD      Result Value Range   Lipase 32  11 - 59 U/L  LACTIC ACID, PLASMA      Result Value Range   Lactic Acid, Venous 2.0  0.5 - 2.2 mmol/L  TROPONIN I      Result Value Range   Troponin I <0.30  <0.30 ng/mL  OCCULT BLOOD, POC DEVICE      Result Value Range   Fecal Occult Bld NEGATIVE  NEGATIVE    Dg Chest Port 1 View 10/18/2012   *RADIOLOGY REPORT*  Clinical Data: Weakness, rectal bleeding  PORTABLE CHEST - 1 VIEW  Comparison: 09/01/2012  Findings: Cardiomediastinal silhouette is stable.  Study is limited by poor inspiration.  Surgical clips in left axilla again noted. No acute infiltrate or pulmonary edema.  Old fracture of the right seventh rib. There is a right IJ Port-A-Cath with tip in SVC right atrium junction.  No diagnostic pneumothorax.  IMPRESSION:  Study is limited by poor inspiration.  Surgical clips in left axilla again noted. No acute infiltrate or pulmonary edema.  Old fracture of the right seventh rib. There is a right IJ Port-A-Cath with tip in SVC right atrium  junction.  No diagnostic pneumothorax.   Original Report Authenticated By: Natasha Mead, M.D.    Results for MARNE, MELINE (MRN [id number]) as of 10/18/2012 18:10  Ref. Range 09/07/2012 14:20 09/27/2012 13:44 10/04/2012 14:10 10/13/2012 13:12 10/18/2012 16:16  BUN Latest Range: 7.0-26.0 mg/dL 78.2 95.6 9.6 21.3 31 (H)  Creatinine Latest Range: 0.50-1.10 mg/dL 0.9 0.9 0.9 1.1 0.86 (H)    Results  for SHAMONA, WIRTZ (MRN [id number]) as of 10/18/2012 18:10  Ref. Range 09/07/2012 14:20 09/27/2012 13:44 10/04/2012 14:10 10/13/2012 13:12 10/18/2012 16:16  AST Latest Range: 0-37 U/L 44 (H) 48 (H) 67 (H) 85 (H) 78 (H)  ALT Latest Range: 0-35 U/L 14 14 15 17 16   Total Bilirubin Latest Range: 0.3-1.2 mg/dL 4.09 (H) 8.11 (H) 9.14 (H) 3.35 (H) 3.2 (H)   Results for IZZA, BICKLE (MRN [id number]) as of 10/18/2012 18:10  Ref. Range 09/14/2012 13:03 09/27/2012 13:44 10/04/2012 14:09 10/13/2012 13:12 10/18/2012 16:16  Hemoglobin Latest Range: 12.0-15.0 g/dL 8.6 (L) 9.4 (L) 9.5 (L) 9.7 (L) 6.3 (LL)  HCT Latest Range: 36.0-46.0 % 25.7 (L) 28.1 (L) 29.5 (L) 29.3 (L) 19.2 (L)  Results for MICHELL, GIULIANO (MRN [id number]) as of 10/18/2012 18:10  Ref. Range 09/24/2011 14:38 11/06/2011 10:13 12/02/2011 13:38 08/06/2012 13:15 10/18/2012 16:16  Prothrombin Time Latest Range: 11.6-15.2 seconds 15.5 (H) 16.5 (H) 15.0 18.2 (H) 22.1 (H)  INR Latest Range: 0.00-1.49  1.20 1.28 1.16 1.56 (H) 2.00 (H)  APTT Latest Range: 24-37 seconds 35 45 (H) 39.9 (H) 47 (H) 46 (H)     1800:  H/H lower than previous; type/screen ordered. Pt's stool heme negative in the ED. BUN/Cr elevated from baseline and appears clinically dehydrated. SBP 90's on arrival, increased to 110's after IVF bolus. Will continue to dose judicious IVF.  [facility] unable to perform her scheduled paracentesis today, states if pt is admitted they can perform tomorrow. Dx and testing d/w pt and family.  Questions answered.  Verb understanding, agreeable to admit.  T/C to [facility].  Mahala Menghini, case discussed, including:  HPI, pertinent PM/SHx, VS/PE, dx testing, ED course and treatment:  Agreeable to admit, requests to write temporary orders, obtain stepdown bed to team 10.     Laray Anger, DO 10/20/12 1646

## 2012-10-18 NOTE — ED Notes (Signed)
MD at bedside. 

## 2012-10-18 NOTE — Progress Notes (Signed)
Utilization Review completed.  Hosam Mcfetridge RN CM  

## 2012-10-18 NOTE — ED Notes (Signed)
Spoke with [place].  They can not do the Paracentesis today, but they will put her on the schedule for tomorrow.  Spoke with Clarene Duke MD and confirmed that Pt will be admitted.

## 2012-10-19 DIAGNOSIS — D689 Coagulation defect, unspecified: Secondary | ICD-10-CM

## 2012-10-19 DIAGNOSIS — D62 Acute posthemorrhagic anemia: Secondary | ICD-10-CM

## 2012-10-19 DIAGNOSIS — K649 Unspecified hemorrhoids: Secondary | ICD-10-CM

## 2012-10-19 DIAGNOSIS — D72829 Elevated white blood cell count, unspecified: Secondary | ICD-10-CM | POA: Diagnosis present

## 2012-10-19 DIAGNOSIS — K7460 Unspecified cirrhosis of liver: Secondary | ICD-10-CM | POA: Diagnosis present

## 2012-10-19 DIAGNOSIS — K625 Hemorrhage of anus and rectum: Secondary | ICD-10-CM

## 2012-10-19 DIAGNOSIS — R180 Malignant ascites: Secondary | ICD-10-CM

## 2012-10-19 LAB — COMPREHENSIVE METABOLIC PANEL
ALT: 16 U/L (ref 0–35)
AST: 80 U/L — ABNORMAL HIGH (ref 0–37)
Alkaline Phosphatase: 127 U/L — ABNORMAL HIGH (ref 39–117)
CO2: 24 mEq/L (ref 19–32)
Calcium: 9.9 mg/dL (ref 8.4–10.5)
Chloride: 99 mEq/L (ref 96–112)
GFR calc non Af Amer: 37 mL/min — ABNORMAL LOW (ref >90)
Glucose, Bld: 88 mg/dL (ref 70–99)
Potassium: 4.1 mEq/L (ref 3.5–5.1)
Sodium: 133 mEq/L — ABNORMAL LOW (ref 135–145)

## 2012-10-19 LAB — CBC
Hemoglobin: 11.8 g/dL — ABNORMAL LOW (ref 12.0–15.0)
Platelets: 120 10*3/uL — ABNORMAL LOW (ref 150–400)
RBC: 3.66 MIL/uL — ABNORMAL LOW (ref 3.87–5.11)
WBC: 12 10*3/uL — ABNORMAL HIGH (ref 4.0–10.5)

## 2012-10-19 LAB — OCCULT BLOOD X 1 CARD TO LAB, STOOL: Fecal Occult Bld: NEGATIVE

## 2012-10-19 MED ORDER — PHYTONADIONE 5 MG PO TABS
10.0000 mg | ORAL_TABLET | Freq: Once | ORAL | Status: AC
  Administered 2012-10-19: 10 mg via ORAL
  Filled 2012-10-19: qty 2

## 2012-10-19 MED ORDER — FUROSEMIDE 10 MG/ML IJ SOLN
INTRAMUSCULAR | Status: AC
  Administered 2012-10-19: 20 mg
  Filled 2012-10-19: qty 4

## 2012-10-19 MED ORDER — POLYETHYLENE GLYCOL 3350 17 G PO PACK
17.0000 g | PACK | Freq: Three times a day (TID) | ORAL | Status: DC
  Administered 2012-10-19 – 2012-10-22 (×3): 17 g via ORAL
  Filled 2012-10-19 (×11): qty 1

## 2012-10-19 MED ORDER — HYDROCORTISONE 2.5 % RE CREA
TOPICAL_CREAM | Freq: Three times a day (TID) | RECTAL | Status: DC
  Administered 2012-10-19 – 2012-10-20 (×3): via RECTAL
  Filled 2012-10-19: qty 28.35

## 2012-10-19 MED ORDER — PHYTONADIONE 5 MG PO TABS
5.0000 mg | ORAL_TABLET | Freq: Once | ORAL | Status: DC
  Filled 2012-10-19: qty 1

## 2012-10-19 NOTE — Consult Note (Signed)
Kelli [name]   DOB:1941-02-22   ZO#:[id number]   JWJ#:[id number]  Subjective: Patient's husband called our office yesterday  with reports that Kelli [name] had several bloody bowel movements that had started on Saturday.  After a long conversation, they agreed to go to the emergency room for evaluation.  After evaluated by the emergency room, she received 3 units of blood, and was admitted to stepdown.  She has also been evaluated by surgery, and gastroenterology has been consulted.  She was also found to have a coagulopathy and was given Vitamin K.  She is doing well today.  She has had no further bleeding, but continues to have the sensation of feeling constipated.  She's also feeling full and distended in her abdomen, and cannot have the paracentesis due to the coagulopathy.  Otherwise, she denies fevers, chills, nausea, vomiting, or further concerns.     Objective:  Filed Vitals:   10/19/12 1207  BP: 130/61  Pulse: 105  Temp: 98.1 F (36.7 C)  Resp: 20    There is no weight on file to calculate BMI.  Intake/Output Summary (Last 24 hours) at 10/19/12 1524 Last data filed at 10/19/12 1348  Gross per 24 hour  Intake   1738 ml  Output    475 ml  Net   1263 ml     Sclerae unicteric  Oropharynx clear  No peripheral adenopathy  Lungs clear -- no rales or rhonchi  Heart regular rate and rhythm  Abdomen distended, mild tenderness in LLQ,  normoactive bowel sounds  MSK no focal spinal tenderness  Neuro nonfocal    CBG (last 3)   Recent Labs  10/19/12 0817  GLUCAP 83     Labs:  Lab Results  Component Value Date   WBC 12.0* 10/19/2012   HGB 11.8* 10/19/2012   HCT 34.9* 10/19/2012   MCV 95.4 10/19/2012   PLT 120* 10/19/2012   NEUTROABS 10.5* 10/18/2012    Urine Studies No results found for this basename: UACOL, UAPR, USPG, UPH, UTP, UGL, UKET, UBIL, UHGB, UNIT, UROB, ULEU, UEPI, UWBC, URBC, UBAC, CAST, CRYS, UCOM, BILUA,  in the last 72 hours  Basic Metabolic  Panel:  Recent Labs Lab 10/13/12 1312 10/18/12 1616 10/19/12 0725  NA 135* 129* 133*  K 4.2 5.2* 4.1  CL  --  98 99  CO2 23 23 24   GLUCOSE 162* 129* 88  BUN 13.6 31* 29*  CREATININE 1.1 1.56* 1.40*  CALCIUM 9.5 10.3 9.9   GFR The CrCl is unknown because both a height and weight (above a minimum accepted value) are required for this calculation. Liver Function Tests:  Recent Labs Lab 10/13/12 1312 10/18/12 1616 10/19/12 0725  AST 85* 78* 80*  ALT 17 16 16   ALKPHOS 133 128* 127*  BILITOT 3.35* 3.2* 4.2*  PROT 5.3* 5.0* 4.8*  ALBUMIN 2.6* 2.4* 2.2*    Recent Labs Lab 10/18/12 1616  LIPASE 32   No results found for this basename: AMMONIA,  in the last 168 hours Coagulation profile  Recent Labs Lab 10/18/12 1616 10/19/12 0725  INR 2.00* 1.97*    CBC:  Recent Labs Lab 10/13/12 1312 10/18/12 1616 10/19/12 0725  WBC 13.3* 16.3* 12.0*  NEUTROABS 8.3* 10.5*  --   HGB 9.7* 6.3* 11.8*  HCT 29.3* 19.2* 34.9*  MCV 109.6* 108.5* 95.4  PLT 114* 173 120*   Cardiac Enzymes:  Recent Labs Lab 10/18/12 1616  TROPONINI <0.30   BNP: No components found with this basename: POCBNP,  CBG:  Recent Labs Lab 10/19/12 0817  GLUCAP 83   D-Dimer No results found for this basename: DDIMER,  in the last 72 hours Hgb A1c No results found for this basename: HGBA1C,  in the last 72 hours Lipid Profile No results found for this basename: CHOL, HDL, LDLCALC, TRIG, CHOLHDL, LDLDIRECT,  in the last 72 hours Thyroid function studies No results found for this basename: TSH, T4TOTAL, FREET3, T3FREE, THYROIDAB,  in the last 72 hours Anemia work up No results found for this basename: VITAMINB12, FOLATE, FERRITIN, TIBC, IRON, RETICCTPCT,  in the last 72 hours Microbiology Recent Results (from the past 240 hour(s))  MRSA PCR SCREENING     Status: None   Collection Time    10/18/12  7:10 PM      Result Value Range Status   MRSA by PCR NEGATIVE  NEGATIVE Final   Comment:             The GeneXpert MRSA Assay (FDA     approved for NASAL specimens     only), is one component of a     comprehensive MRSA colonization     surveillance program. It is not     intended to diagnose MRSA     infection nor to guide or     monitor treatment for     MRSA infections.      Studies:  Dg Chest Port 1 View  10/18/2012   *RADIOLOGY REPORT*  Clinical Data: Weakness, rectal bleeding  PORTABLE CHEST - 1 VIEW  Comparison: 09/01/2012  Findings: Cardiomediastinal silhouette is stable.  Study is limited by poor inspiration.  Surgical clips in left axilla again noted. No acute infiltrate or pulmonary edema.  Old fracture of the right seventh rib. There is a right IJ Port-A-Cath with tip in SVC right atrium junction.  No diagnostic pneumothorax.  IMPRESSION:  Study is limited by poor inspiration.  Surgical clips in left axilla again noted. No acute infiltrate or pulmonary edema.  Old fracture of the right seventh rib. There is a right IJ Port-A-Cath with tip in SVC right atrium junction.  No diagnostic pneumothorax.   Original Report Authenticated By: Natasha Mead, M.D.    Assessment/Plan: 72 y.o. female with   1. Stage IV breast cancer, patient receiving Faslodex and Zometa.  Currently on hold due to current illness.  CT chest/abd/pelvis would be preferential to do while patient is in house due to patient requiring a steroid prep, but patient has AKI and cannot receive contrast currently.  Will wait for kidney recovery.     2. Rectal Bleeding: patient has received 3 units of blood.  Awaiting GI consult.    3. Ascites: Paracentesis once coagulopathy is corrected.    4. Other medical problems as per the hospitalist team, thank you for providing excellent care to our patient.    Kelli Ouch Lyn Hollingshead, NP Medical Oncology [facility] Phone: [phone] Kelli [name] 10/19/2012  Kelli Second, MD Medical/Oncology [facility] [phone]  (beeper) [phone] (Office)  10/27/2012, 7:38 PM

## 2012-10-19 NOTE — Progress Notes (Signed)
Stopped to see patient at nurse request. Patient was in the bathroom and indisposed. Will stop back later if she feels up to a visit.

## 2012-10-19 NOTE — Consult Note (Signed)
Kelli [name]  Jul 30, 1940 [id number]  CARE TEAM:  PCP: [name], MD  Outpatient Care Team: Patient Care Team: [name], MD as PCP - General (Family Medicine)  Inpatient Treatment Team: Treatment Team: Attending Provider: Marinda Elk, MD; Consulting Physician: Bishop Limbo, MD; Registered Nurse: Guy Franco, RN; Rounding Team: Merlyn Albert, MD  This patient is a 72 y.o.female who presents today for surgical evaluation at the request of Dr. Mahala Menghini.   Reason for evaluation: Rectal bleeding.  Probable hemorrhoids.  Pleasant woman with metastatic breast cancer.  Has been undergoing chemotherapy.  Has developed ascites and has had to have treatment for that.  She was found to be profoundly anemic on an emergency room evaluation to two persistent bleeding and fatigue.  She suffers with constipation and requires MiraLAX and Dulcolax.  Was found to have obvious hemorrhoids.  Surgical consultation requested.She has been evaluated by gastroenterology with [facility] in the past.  She thinks it was Dr. Jeannetta Ellis.  Has been several years.  The patient is now DO NOT RESUSCITATE.  However, medicine was concerned about hemorrhoids the possible etiology and wondered if something could be done.  Past Medical History  Diagnosis Date  . Thyroid disease IN 1982    HX IODINE-131 ABLATION FOR HYPERTHYROIDISM  . Osteoporosis   . Vitamin D insufficiency   . Chronic fatigue   . Depression   . Anxiety   . Insomnia   . History of chemotherapy     Adriamycin/cytoxan followed by [facility]  08/2000/tamoxifen 06/2001-02/2003,then arimidex 02/2003-08/2009  . 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 History of radiation therapy 12/28/00-02/12/01    left breast  With Dr. Jamie Kato  . Hypothyroidism   . Fatigue   . Short of breath on exertion   . Breast cancer, left JUNE OF 2002    LEFT BREAST, ER/PR +, HER 2 -  . 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  Metastatic cancer to liver 09/24/11    CURRENTLY RECEIVING RADIATION  . Radiation 10/15/11-10/28/2011    left proximal leg  . Hydronephrosis, left     SECONDARY TO RETROPERITONEAL METS    Past Surgical History  Procedure Laterality Date  . Cholecystectomy  1970  . Femur im nail  09/24/2011     Procedure: INTRAMEDULLARY (IM) NAIL FEMORAL;  Surgeon: Toni Arthurs, MD;  Location: [facility];  Service: Orthopedics;  Laterality: Left;  FEMURAL NECK RX/ INTERTROCHANTERIC PATHOLOGIC FX  . Bone biopsy  09/25/2011    left medullary canal femur reaming=Metastatic Carcinoma  . Hysteroscopy w/d&c  01-11-2003    REMOVAL POLYP  . Left partial mastectomy / sentinel lymph node bx  08-28-2000    LEFT BREAST CANCER  . Cysto/ left retrograde pyleogram/  left ureteral stent placement  10-30-2011  . Cystoscopy w/ ureteral stent placement Left 07/22/2012    Procedure: CYSTOSCOPY WITH STENT REPLACEMENT;  Surgeon: Marcine Matar, MD;  Location: [facility];  Service: Urology;  Laterality: Left;    History   Social History  . Marital Status: Married    Spouse Name: N/A    Number of Children: N/A  . Years of Education: N/A   Occupational History  . Not on file.   Social History Main Topics  . Smoking status: Never Smoker   . Smokeless tobacco: Never Used  . Alcohol Use: No  . Drug Use: No  . Sexually Active: Not on file   Other Topics Concern  .  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    Family History  Problem Relation Age of Onset  . Cancer Mother     breast  . Heart attack Father   . CVA Sister   . Dementia Brother     Current Facility-Administered Medications  Medication Dose Route Frequency Provider Last Rate Last Dose  . 0.9 %  sodium chloride infusion   Intravenous Continuous Laray Anger, DO 75 mL/hr at 10/19/12 0530    . diclofenac sodium (VOLTAREN) 1 % transdermal gel 2 g  2 g Topical BID PRN  Rhetta Mura, MD      . docusate sodium (COLACE) capsule 100 mg  100 mg Oral Daily Rhetta Mura, MD   100 mg at 10/18/12 2050  . FLUoxetine (PROZAC) capsule 20 mg  20 mg Oral Q1200 Rhetta Mura, MD      . furosemide (LASIX) injection 20 mg  20 mg Intravenous Once Rhetta Mura, MD      . HYDROcodone-acetaminophen (NORCO/VICODIN) 5-325 MG per tablet 1 tablet  1 tablet Oral Q6H PRN Rhetta Mura, MD      . hydrocortisone (ANUSOL-HC) suppository 25 mg  25 mg Rectal BID Rhetta Mura, MD   25 mg at 10/18/12 2210  . HYDROmorphone (DILAUDID) injection 0.5 mg  0.5 mg Intravenous Q3H PRN Rhetta Mura, MD      . HYDROmorphone (DILAUDID) tablet 2 mg  2 mg Oral Q4H PRN Rhetta Mura, MD      . levothyroxine (SYNTHROID, LEVOTHROID) tablet 75 mcg  75 mcg Oral Q0600 Rhetta Mura, MD      . LORazepam (ATIVAN) tablet 1 mg  1 mg Oral Q6H PRN Rhetta Mura, MD      . pantoprazole (PROTONIX) EC tablet 40 mg  40 mg Oral Daily Rhetta Mura, MD   40 mg at 10/18/12 2050  . polyethylene glycol (MIRALAX / GLYCOLAX) packet 17 g  17 g Oral Daily PRN Gwen Her, RPH      . sodium chloride 0.9 % injection 3 mL  3 mL Intravenous Q12H Rhetta Mura, MD   3 mL at 10/18/12 2210  . sucralfate (CARAFATE) tablet 1 g  1 g Oral TID AC & HS Rhetta Mura, MD   1 g at 10/18/12 2210  . temazepam (RESTORIL) capsule 15 mg  15 mg Oral QHS PRN Rhetta Mura, MD      . zolpidem (AMBIEN) tablet 5 mg  5 mg Oral QHS PRN Rhetta Mura, MD   5 mg at 10/18/12 2344     Allergies  Allergen Reactions  . Cephalexin Nausea Only  . Contrast Media (Iodinated Diagnostic Agents) Rash    GIVE BENADRYL PRIOR  . Other Nausea And Vomiting and Rash    "MYCIN" DRUGS  . Sulfa Antibiotics Nausea And Vomiting and Rash    ROS: Constitutional:  No fevers, chills, sweats.  Weight stable Eyes:  No vision changes, No discharge HENT:  No sore throats, nasal  drainage Lymph: No neck swelling, No bruising easily Pulmonary:  No cough, productive sputum CV: No orthopnea, PND  Patient walks 5 minutes for about without difficulty.  No exertional chest/neck/shoulder/arm pain. GI: No personal nor family history of GI/colon cancer, inflammatory bowel disease, irritable bowel syndrome, allergy such as Celiac Sprue, dietary/dairy problems, colitis, ulcers nor gastritis.  No recent sick contacts/gastroenteritis.  No travel outside the country.  No changes in diet. Renal: No UTIs,  No hematuria Genital:  No drainage, bleeding, masses Musculoskeletal: No severe joint pain.  Good ROM major joints Skin:  No sores or lesions.  No rashes Heme/Lymph:  No easy bleeding.  No swollen lymph nodes Neuro: No focal weakness/numbness.  No seizures Psych: No suicidal ideation.  No hallucinations  BP 115/48  Pulse 89  Temp(Src) 97.5 F (36.4 C) (Oral)  Resp 21  SpO2 93%  Physical Exam: General: Pt awake/alert/oriented x4 in no major acute distress Eyes: PERRL, normal EOM. Sclera nonicteric Neuro: CN II-XII intact w/o focal sensory/motor deficits. Lymph: No head/neck/groin lymphadenopathy Psych:  No delerium/psychosis/paranoia HENT: Normocephalic, Mucus membranes moist.  No thrush Neck: Supple, No tracheal deviation Chest: No pain.  Good respiratory excursion. CV:  Pulses intact.  Regular rhythm Abdomen: Soft, Nondistended.  Nontender.  No incarcerated hernias.  GU: NEFG.  Large external hemorrhoids.  Some ecchymosis associated with it no thrombosis.  No fissure.  No abscess.  Normal sphincter tone.  No active bleeding.  Ext:  SCDs BLE.  2+ anasarca.  No cyanosis Skin: No petechiae / purpurea.  No major sores Musculoskeletal: No severe joint pain.  Good ROM major joints   Results:   Labs: Results for orders placed during the hospital encounter of 10/18/12 (from the past 48 hour(s))  CBC WITH DIFFERENTIAL     Status: Abnormal   Collection Time    10/18/12   4:16 PM      Result Value Range   WBC 16.3 (*) 4.0 - 10.5 K/uL   RBC 1.77 (*) 3.87 - 5.11 MIL/uL   Hemoglobin 6.3 (*) 12.0 - 15.0 g/dL   Comment: REPEATED TO VERIFY     CRITICAL RESULT CALLED TO, READ BACK BY AND VERIFIED WITH:     DR K MCMANUS 1732 10/18/12 A NAVARRO   HCT 19.2 (*) 36.0 - 46.0 %   MCV 108.5 (*) 78.0 - 100.0 fL   MCH 35.6 (*) 26.0 - 34.0 pg   MCHC 32.8  30.0 - 36.0 g/dL   RDW 62.1 (*) 30.8 - 65.7 %   Platelets 173  150 - 400 K/uL   Neutrophils Relative % 64  43 - 77 %   Neutro Abs 10.5 (*) 1.7 - 7.7 K/uL   Lymphocytes Relative 24  12 - 46 %   Lymphs Abs 3.9  0.7 - 4.0 K/uL   Monocytes Relative 11  3 - 12 %   Monocytes Absolute 1.9 (*) 0.1 - 1.0 K/uL   Eosinophils Relative 0  0 - 5 %   Eosinophils Absolute 0.1  0.0 - 0.7 K/uL   Basophils Relative 0  0 - 1 %   Basophils Absolute 0.0  0.0 - 0.1 K/uL  PROTIME-INR     Status: Abnormal   Collection Time    10/18/12  4:16 PM      Result Value Range   Prothrombin Time 22.1 (*) 11.6 - 15.2 seconds   INR 2.00 (*) 0.00 - 1.49  APTT     Status: Abnormal   Collection Time    10/18/12  4:16 PM      Result Value Range   aPTT 46 (*) 24 - 37 seconds   Comment:            IF BASELINE aPTT IS ELEVATED,     SUGGEST PATIENT RISK ASSESSMENT     BE USED TO DETERMINE APPROPRIATE     ANTICOAGULANT THERAPY.  COMPREHENSIVE METABOLIC PANEL     Status: Abnormal   Collection Time  10/18/12  4:16 PM      Result Value Range   Sodium 129 (*) 135 - 145 mEq/L   Potassium 5.2 (*) 3.5 - 5.1 mEq/L   Chloride 98  96 - 112 mEq/L   CO2 23  19 - 32 mEq/L   Glucose, Bld 129 (*) 70 - 99 mg/dL   BUN 31 (*) 6 - 23 mg/dL   Creatinine, Ser 1.47 (*) 0.50 - 1.10 mg/dL   Calcium 82.9  8.4 - 56.2 mg/dL   Total Protein 5.0 (*) 6.0 - 8.3 g/dL   Albumin 2.4 (*) 3.5 - 5.2 g/dL   AST 78 (*) 0 - 37 U/L   ALT 16  0 - 35 U/L   Alkaline Phosphatase 128 (*) 39 - 117 U/L   Total Bilirubin 3.2 (*) 0.3 - 1.2 mg/dL   GFR calc non Af Amer 32 (*) >90 mL/min    GFR calc Af Amer 37 (*) >90 mL/min   Comment:            The eGFR has been calculated     using the CKD EPI equation.     This calculation has not been     validated in all clinical     situations.     eGFR's persistently     <90 mL/min signify     possible Chronic Kidney Disease.  LIPASE, BLOOD     Status: None   Collection Time    10/18/12  4:16 PM      Result Value Range   Lipase 32  11 - 59 U/L  LACTIC ACID, PLASMA     Status: None   Collection Time    10/18/12  4:16 PM      Result Value Range   Lactic Acid, Venous 2.0  0.5 - 2.2 mmol/L  TROPONIN I     Status: None   Collection Time    10/18/12  4:16 PM      Result Value Range   Troponin I <0.30  <0.30 ng/mL   Comment:            Due to the release kinetics of cTnI,     a negative result within the first hours     of the onset of symptoms does not rule out     myocardial infarction with certainty.     If myocardial infarction is still suspected,     repeat the test at appropriate intervals.  TYPE AND SCREEN     Status: None   Collection Time    10/18/12  4:16 PM      Result Value Range   ABO/RH(D) A POS     Antibody Screen NEG     Sample Expiration 10/21/2012     Unit Number [id number]     Blood Component Type RED CELLS,LR     Unit division 00     Status of Unit ISSUED     Transfusion Status OK TO TRANSFUSE     Crossmatch Result Compatible     Unit Number [id number]     Blood Component Type RED CELLS,LR     Unit division 00     Status of Unit ISSUED     Transfusion Status OK TO TRANSFUSE     Crossmatch Result Compatible     Unit Number [id number]     Blood Component Type RED CELLS,LR     Unit division 00     Status of Unit ISSUED     Transfusion  Status OK TO TRANSFUSE     Crossmatch Result Compatible    OCCULT BLOOD, POC DEVICE     Status: None   Collection Time    10/18/12  4:38 PM      Result Value Range   Fecal Occult Bld NEGATIVE  NEGATIVE  URINALYSIS W MICROSCOPIC + REFLEX CULTURE      Status: Abnormal   Collection Time    10/18/12  6:09 PM      Result Value Range   Color, Urine AMBER (*) YELLOW   Comment: BIOCHEMICALS MAY BE AFFECTED BY COLOR   APPearance TURBID (*) CLEAR   Specific Gravity, Urine 1.022  1.005 - 1.030   pH 5.0  5.0 - 8.0   Glucose, UA NEGATIVE  NEGATIVE mg/dL   Hgb urine dipstick LARGE (*) NEGATIVE   Bilirubin Urine SMALL (*) NEGATIVE   Ketones, ur NEGATIVE  NEGATIVE mg/dL   Protein, ur 161 (*) NEGATIVE mg/dL   Urobilinogen, UA 1.0  0.0 - 1.0 mg/dL   Nitrite NEGATIVE  NEGATIVE   Leukocytes, UA LARGE (*) NEGATIVE   WBC, UA TOO NUMEROUS TO COUNT  <3 WBC/hpf   Comment: CORRECTED ON 07/28 AT 2100: PREVIOUSLY REPORTED AS FIELD OBSCURED BY WBC'S   RBC / HPF FIELD OBSCURED BY WBC'S  <3 RBC/hpf   Casts FIELD OBSCURED BY WBC'S (*) NEGATIVE  PREPARE RBC (CROSSMATCH)     Status: None   Collection Time    10/18/12  6:54 PM      Result Value Range   Order Confirmation ORDER PROCESSED BY BLOOD BANK    MRSA PCR SCREENING     Status: None   Collection Time    10/18/12  7:10 PM      Result Value Range   MRSA by PCR NEGATIVE  NEGATIVE   Comment:            The GeneXpert MRSA Assay (FDA     approved for NASAL specimens     only), is one component of a     comprehensive MRSA colonization     surveillance program. It is not     intended to diagnose MRSA     infection nor to guide or     monitor treatment for     MRSA infections.    Imaging / Studies: US Abdomen Complete  09/21/2012   *RADIOLOGY REPORT*  Clinical Data:  Abdominal pain.  Metastatic breast cancer.  Left renal stent.  Post cholecystectomy  COMPLETE ABDOMINAL ULTRASOUND  Comparison:  Abdominal MRI on 02/27/2012  Findings:  Gallbladder:  Has been surgically removed  Common bile duct:  Has a diameter 7.2 mm.  This is within acceptable limits for a post cholecystectomy patient  Liver:  Two focal hepatic lesions are identified:  one target lesion in the medial segment of the right lobe of the liver  measuring 3.0 x 2.4 x 3.0 cm and a smaller hypoechoic lesion identified in the left lobe of the liver measuring 1.1 x 0.9 x 1.0 cm. This would correspond with the patient's known metastatic disease.  No other discrete focal lesions are seen sonographically but overall hepatic echotexture is heterogeneous.  No intrahepatic ductal dilatation is seen  IVC:  The proximal portion appears normal  Pancreas:  The head and body are normal in size and echotexture. The tail is incompletely assessed  Spleen:  Has a sagittal length of 12.1 cm inhomogeneous pattern with no focal lesions seen  Right Kidney:  Has a sagittal length  of 9.9 cm.  No focal parenchymal abnormalities are seen and no signs of hydronephrosis are  Left Kidney:  Has a sagittal length of 10.6 cm.  A moderate degree of dilatation is seen.  A circular structure in the upper portion of the dilated pelvis likely represents the pelvis and of the of patient's indwelling stent. A shadowing echogenic focus in the lower pole calyceal system measuring 7 x 7 mm with posterior twinkle artifact shows no posterior shadowing but is suspicious for a non-obstructing stone.  Abdominal aorta:  Has a caliber of 2.1 cm with no aneurysmal dilatation  Other:  Small bilateral pleural effusions and moderate amount of fluid in the upper abdomen is noted.  IMPRESSION: Two focal areas in the liver sonographically would correlate with the patient's known hepatic metastatic disease, as described above.  Upper abdominal ascites and small bilateral pleural effusions.  Left renal hydronephrosis with probable lower pole nonobstructing calculus.   Original Report Authenticated By: Rhodia Albright, M.D.   US Paracentesis  10/07/2012   *RADIOLOGY REPORT*  Clinical Data: Metastatic breast cancer, recurrent ascites. Request is made for therapeutic paracentesis.  ULTRASOUND GUIDED  THERAPEUTIC PARACENTESIS  An ultrasound guided paracentesis was thoroughly discussed with the patient and questions  answered.  The benefits, risks, alternatives and complications were also discussed.  The patient understands and wishes to proceed with the procedure.  Written consent was obtained.  Ultrasound was performed to localize and mark an adequate pocket of fluid in the left lower quadrant of the abdomen.  The area was then prepped and draped in the normal sterile fashion.  1% Lidocaine was used for local anesthesia.  Under ultrasound guidance a 19 gauge Yueh catheter was introduced.  Paracentesis was performed.  The catheter was removed and a dressing applied.  Complications:  none  Findings:  A total of approximately 3.5 liters of slightly turbid, yellow fluid was removed.  IMPRESSION: Successful ultrasound guided therapeutic paracentesis yielding 3.5 liters of ascites.  Read by: Jeananne Rama, P.A.-C.   Original Report Authenticated By: Tacey Ruiz, MD   US Paracentesis  09/28/2012   *RADIOLOGY REPORT*  Clinical Data: Metastatic breast cancer, abdominal distension, request for therapeutic paracentesis  ULTRASOUND GUIDED PARACENTESIS  Comparison:  None  An ultrasound guided paracentesis was thoroughly discussed with the patient and questions answered.  The benefits, risks, alternatives and complications were also discussed.  The patient understands and wishes to proceed with the procedure.  Written consent was obtained.  Ultrasound was performed to localize and mark an adequate pocket of fluid in the left lower quadrant of the abdomen.  The area was then prepped and draped in the normal sterile fashion.  1% Lidocaine was used for local anesthesia.  Under ultrasound guidance a 19 gauge Yueh catheter was introduced.  Paracentesis was performed.  The catheter was removed and a dressing applied.  Complications:  None  Findings:  A total of approximately 2.3 liters of hazy yellow fluid was removed.  A fluid sample was not sent for laboratory analysis.  IMPRESSION: Successful ultrasound guided paracentesis yielding 2.3  liters of ascites.  Read by Brayton El PA-C   Original Report Authenticated By: Richarda Overlie, M.D.   Dg Chest Port 1 View  10/18/2012   *RADIOLOGY REPORT*  Clinical Data: Weakness, rectal bleeding  PORTABLE CHEST - 1 VIEW  Comparison: 09/01/2012  Findings: Cardiomediastinal silhouette is stable.  Study is limited by poor inspiration.  Surgical clips in left axilla again noted. No acute infiltrate or pulmonary  edema.  Old fracture of the right seventh rib. There is a right IJ Port-A-Cath with tip in SVC right atrium junction.  No diagnostic pneumothorax.  IMPRESSION:  Study is limited by poor inspiration.  Surgical clips in left axilla again noted. No acute infiltrate or pulmonary edema.  Old fracture of the right seventh rib. There is a right IJ Port-A-Cath with tip in SVC right atrium junction.  No diagnostic pneumothorax.   Original Report Authenticated By: Natasha Mead, M.D.   Dg Abd 2 Views  09/27/2012   *RADIOLOGY REPORT*  Clinical Data: Constipation and abdominal distention  ABDOMEN - 2 VIEW  Comparison: Ultrasound 09/21/2012  Findings: There is a left-sided Nephro ureteral stent in place.  No renal calculi identified.  The bowel gas pattern appears normal. No dilated loops of small bowel or air-fluid levels identified. Small pleural effusions are noted.  IMPRESSION:  1.  Nonobstructive bowel gas pattern. 2.  Left sided ureteral stent.   Original Report Authenticated By: Signa Kell, M.D.    Medications / Allergies: per chart  Antibiotics: Anti-infectives   None      Assessment  Tache R Dobek  72 y.o. 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   I recommended correction of coagulopathy with FFP.  That has not happened yet.  I recommend free considering that.  Until the INR is less than 1.5, surgery will not help solve this problem.  I strongly recommend the patient be on a bowel regimen with MiraLAX twice a day to avoid the severe constipation.  Consider obtaining a prior colonoscopy records from [facility] gastroenterology.  Make sure there is no more proximal source.  Local palliation of the inflamed hemorrhoids with soaks and topical treatment is appropriate.    I do not think there is a good option at the bedside since these are external and sensitive.  Banding/Injections would not work.  I would reserve hemorrhoidectomy as the very last option she does not want any surgery done at this time unless absolutely mandated  .Would require general anesthesia in the operating room to control.  I think that is appropriate To hold off on any intervention since she is not actively bleeding and is hemodynamically stable with observation in the step down unit..  We will be available as needed.  -VTE prophylaxis- SCDs, etc -mobilize as tolerated to help recovery    Ardeth Sportsman, M.D., F.A.C.S. Gastrointestinal and Minimally Invasive Surgery [facility] [address], [address] [place], [place] [address] [phone] Main / Paging   10/19/2012

## 2012-10-19 NOTE — Progress Notes (Addendum)
[facility] PROGRESS NOTE Interim History: 72 y.o. female , known T1N3 invasive ductal left breast cancer diagnosed June 2002 comes in for Rectal bleeding that began 7/25 baseline Hbg around 9.4, about 3-4 episodes per patinet. It seemed ot worsen 7/29-was BRB per rectum, GI consulted,m s/p 3 units 7.29.2014   Assessment/Plan: Acute blood loss anemia/ Lower GI bleed/  Anemia: - BaselineHbg 9.0 drop to 6.3 s/p 3 units of PRBC  7.29.2014 now 11.8 - GI consult for further evaluation. She is on Voltaren gel at home. Hold diuretics. - 2007 colonoscopy with in normal  Coagulopathy: - Unclear etiology, not on coumadin or NOAC's at home. - ? If due to liver disease. Elevated INR, chronic mild elevation of LFT'sascities, skin angiodisplasia, liver ultrasound - Given vit K.  AKI: - most likely pre-renal. - cont IV fluids. - baseline cr 1.0. b-met in am.   Leukocytosis, unspecified - improving, no antibiotics ? reactive   Breast cancer metastasized to multiple sites including liver: - follow up with Onc.  Malignant asci ties:  - for paracentesis today  H/o malignant hydronephrosis: - Follow up OP with Dr. Retta Diones, Urology-nothing significant to this admission  Hypothyroid - continue Syntrhoid 75 mcg daily  Code Status: DNR  Family Communication: spoke with husband at bedside  Disposition Plan: Inpatient, SDU     Consultants:  Oncology  Gastroenterology  Procedures:  Paracentesis  Antibiotics:  None  HPI/Subjective: She relates no bloody BM for the last 8hrs. Mild suprapubic abdominal pain  Objective: Filed Vitals:   10/19/12 0425 10/19/12 0500 10/19/12 0505 10/19/12 0525  BP: 107/50 115/62 115/62 115/48  Pulse: 91 90 90 89  Temp: 97.6 F (36.4 C) 97.5 F (36.4 C)  97.5 F (36.4 C)  TempSrc:  Oral Oral Oral  Resp: 22 21 21 21   SpO2:   93%     Intake/Output Summary (Last 24 hours) at 10/19/12 0739 Last data filed at 10/19/12 0700  Gross per 24  hour  Intake   1618 ml  Output    475 ml  Net   1143 ml   There were no vitals filed for this visit.  Exam:  General: Alert, awake, oriented x3, in no acute distress.  HEENT: No bruits, no goiter.  Heart: Regular rate and rhythm, without murmurs, rubs, gallops.  Lungs: Good air movement, clear to auscultation  Abdomen: Soft, suprapubic tender, distended, positive bowel sounds.  Neuro: Grossly intact, nonfocal.   Data Reviewed: Basic Metabolic Panel:  Recent Labs Lab 10/13/12 1312 10/18/12 1616  NA 135* 129*  K 4.2 5.2*  CL  --  98  CO2 23 23  GLUCOSE 162* 129*  BUN 13.6 31*  CREATININE 1.1 1.56*  CALCIUM 9.5 10.3   Liver Function Tests:  Recent Labs Lab 10/13/12 1312 10/18/12 1616  AST 85* 78*  ALT 17 16  ALKPHOS 133 128*  BILITOT 3.35* 3.2*  PROT 5.3* 5.0*  ALBUMIN 2.6* 2.4*    Recent Labs Lab 10/18/12 1616  LIPASE 32   No results found for this basename: AMMONIA,  in the last 168 hours CBC:  Recent Labs Lab 10/13/12 1312 10/18/12 1616  WBC 13.3* 16.3*  NEUTROABS 8.3* 10.5*  HGB 9.7* 6.3*  HCT 29.3* 19.2*  MCV 109.6* 108.5*  PLT 114* 173   Cardiac Enzymes:  Recent Labs Lab 10/18/12 1616  TROPONINI <0.30   BNP (last 3 results) No results found for this basename: PROBNP,  in the last 8760 hours CBG: No results found for  this basename: GLUCAP,  in the last 168 hours  Recent Results (from the past 240 hour(s))  MRSA PCR SCREENING     Status: None   Collection Time    10/18/12  7:10 PM      Result Value Range Status   MRSA by PCR NEGATIVE  NEGATIVE Final   Comment:            The GeneXpert MRSA Assay (FDA     approved for NASAL specimens     only), is one component of a     comprehensive MRSA colonization     surveillance program. It is not     intended to diagnose MRSA     infection nor to guide or     monitor treatment for     MRSA infections.     Studies: Dg Chest Port 1 View  10/18/2012   *RADIOLOGY REPORT*  Clinical  Data: Weakness, rectal bleeding  PORTABLE CHEST - 1 VIEW  Comparison: 09/01/2012  Findings: Cardiomediastinal silhouette is stable.  Study is limited by poor inspiration.  Surgical clips in left axilla again noted. No acute infiltrate or pulmonary edema.  Old fracture of the right seventh rib. There is a right IJ Port-A-Cath with tip in SVC right atrium junction.  No diagnostic pneumothorax.  IMPRESSION:  Study is limited by poor inspiration.  Surgical clips in left axilla again noted. No acute infiltrate or pulmonary edema.  Old fracture of the right seventh rib. There is a right IJ Port-A-Cath with tip in SVC right atrium junction.  No diagnostic pneumothorax.   Original Report Authenticated By: Natasha Mead, M.D.    Scheduled Meds: . docusate sodium  100 mg Oral Daily  . FLUoxetine  20 mg Oral Q1200  . furosemide  20 mg Intravenous Once  . hydrocortisone  25 mg Rectal BID  . levothyroxine  75 mcg Oral Q0600  . pantoprazole  40 mg Oral Daily  . sodium chloride  3 mL Intravenous Q12H  . sucralfate  1 g Oral TID AC & HS   Continuous Infusions: . sodium chloride 75 mL/hr at 10/19/12 0530     FELIZ Rosine Beat  [facility] Pager [phone]. If 8PM-8AM, please contact night-coverage at [email or web address], password [facility] 10/19/2012, 7:39 AM  LOS: 1 day

## 2012-10-19 NOTE — Consult Note (Signed)
[facility] CONSULT Reason for consult: lower G.I. bleeding Referring Physician: [facility]. PCP: Dr. Jeannetta Nap Oncologists: Dr. Darrold Span. G.I.: Dr. Madelyn Flavors Kelli [name] is an 72 y.o. female.  HPI: unfortunate woman who has T-1 in 3 {cancer diagnosed initially in 2002 multiple positive lymph nodes. She was treated with adjuvant chemotherapy. She has had malignant ascites and apparently has had acidic fluid drawn at least 2 or 3 times and was due to have that done again yesterday. She's had a long history of hemorrhoids. Her husband reports that she began to have rectal bleeding approximately 4 days ago described as bright blood on the tissue paper. She has had very poor bowel movements and has been constipated with passage of only a small amount of stool. The bleeding was particularly bad yesterday and she was admitted to the hospital. Lab work revealed hemoglobin 6.3 she received 3 units of blood. Patient reports that she has been eating reasonably well without vomiting. She said no heartburn indigestion. Up until she began to have bright blood per rectum she is not had any melena. She had colonoscopy 2007 by Dr. Matthias Hughs for screening purposes and did reveal hemorrhoids but was negative for AVM or for diverticulosis.  Past Medical History  Diagnosis Date  . Thyroid disease IN 1982    HX IODINE-131 ABLATION FOR HYPERTHYROIDISM  . Osteoporosis   . Vitamin D insufficiency   . Chronic fatigue   . Depression   . Anxiety   . Insomnia   . History of chemotherapy     Adriamycin/cytoxan followed by [facility]  08/2000/tamoxifen 06/2001-02/2003,then arimidex 02/2003-08/2009  . 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 History of radiation therapy 12/28/00-02/12/01    left breast  With Dr. Jamie Kato  . Hypothyroidism   . Fatigue   . Short of breath on exertion   . Breast cancer, left JUNE OF 2002    LEFT BREAST, ER/PR +, HER 2 -  . Metastasis to bone 09/24/11    left femur=bone Metastatic  Carcinoma  . Metastatic cancer to liver 09/24/11    CURRENTLY RECEIVING RADIATION  . Radiation 10/15/11-10/28/2011    left proximal leg  . Hydronephrosis, left     SECONDARY TO RETROPERITONEAL METS    Past Surgical History  Procedure Laterality Date  . Cholecystectomy  1970  . Femur im nail  09/24/2011     Procedure: INTRAMEDULLARY (IM) NAIL FEMORAL;  Surgeon: Toni Arthurs, MD;  Location: [facility];  Service: Orthopedics;  Laterality: Left;  FEMURAL NECK RX/ INTERTROCHANTERIC PATHOLOGIC FX  . Bone biopsy  09/25/2011    left medullary canal femur reaming=Metastatic Carcinoma  . Hysteroscopy w/d&c  01-11-2003    REMOVAL POLYP  . Left partial mastectomy / sentinel lymph node bx  08-28-2000    LEFT BREAST CANCER  . Cysto/ left retrograde pyleogram/  left ureteral stent placement  10-30-2011  . Cystoscopy w/ ureteral stent placement Left 07/22/2012    Procedure: CYSTOSCOPY WITH STENT REPLACEMENT;  Surgeon: Marcine Matar, MD;  Location: [facility];  Service: Urology;  Laterality: Left;    Family History  Problem Relation Age of Onset  . Cancer Mother     breast  . Heart attack Father   . CVA Sister   . Dementia Brother     Social History:  reports that she has never smoked. She has never used smokeless tobacco. She reports that she does not drink alcohol or use illicit drugs.  Allergies:  Allergies  Allergen Reactions  .  Cephalexin Nausea Only  . Contrast Media (Iodinated Diagnostic Agents) Rash    GIVE BENADRYL PRIOR  . Other Nausea And Vomiting and Rash    "MYCIN" DRUGS  . Sulfa Antibiotics Nausea And Vomiting and Rash    Medications; . docusate sodium  100 mg Oral Daily  . FLUoxetine  20 mg Oral Q1200  . furosemide  20 mg Intravenous Once  . hydrocortisone  25 mg Rectal BID  . levothyroxine  75 mcg Oral Q0600  . pantoprazole  40 mg Oral Daily  . sodium chloride  3 mL Intravenous Q12H  . sucralfate  1 g Oral TID AC & HS   PRN Meds diclofenac sodium,  HYDROcodone-acetaminophen, HYDROmorphone (DILAUDID) injection, HYDROmorphone, LORazepam, polyethylene glycol, temazepam, zolpidem Results for orders placed during the hospital encounter of 10/18/12 (from the past 48 hour(s))  CBC WITH DIFFERENTIAL     Status: Abnormal   Collection Time    10/18/12  4:16 PM      Result Value Range   WBC 16.3 (*) 4.0 - 10.5 K/uL   RBC 1.77 (*) 3.87 - 5.11 MIL/uL   Hemoglobin 6.3 (*) 12.0 - 15.0 g/dL   Comment: REPEATED TO VERIFY     CRITICAL RESULT CALLED TO, READ BACK BY AND VERIFIED WITH:     DR K MCMANUS 1732 10/18/12 A NAVARRO   HCT 19.2 (*) 36.0 - 46.0 %   MCV 108.5 (*) 78.0 - 100.0 fL   MCH 35.6 (*) 26.0 - 34.0 pg   MCHC 32.8  30.0 - 36.0 g/dL   RDW 96.0 (*) 45.4 - 09.8 %   Platelets 173  150 - 400 K/uL   Neutrophils Relative % 64  43 - 77 %   Neutro Abs 10.5 (*) 1.7 - 7.7 K/uL   Lymphocytes Relative 24  12 - 46 %   Lymphs Abs 3.9  0.7 - 4.0 K/uL   Monocytes Relative 11  3 - 12 %   Monocytes Absolute 1.9 (*) 0.1 - 1.0 K/uL   Eosinophils Relative 0  0 - 5 %   Eosinophils Absolute 0.1  0.0 - 0.7 K/uL   Basophils Relative 0  0 - 1 %   Basophils Absolute 0.0  0.0 - 0.1 K/uL  PROTIME-INR     Status: Abnormal   Collection Time    10/18/12  4:16 PM      Result Value Range   Prothrombin Time 22.1 (*) 11.6 - 15.2 seconds   INR 2.00 (*) 0.00 - 1.49  APTT     Status: Abnormal   Collection Time    10/18/12  4:16 PM      Result Value Range   aPTT 46 (*) 24 - 37 seconds   Comment:            IF BASELINE aPTT IS ELEVATED,     SUGGEST PATIENT RISK ASSESSMENT     BE USED TO DETERMINE APPROPRIATE     ANTICOAGULANT THERAPY.  COMPREHENSIVE METABOLIC PANEL     Status: Abnormal   Collection Time    10/18/12  4:16 PM      Result Value Range   Sodium 129 (*) 135 - 145 mEq/L   Potassium 5.2 (*) 3.5 - 5.1 mEq/L   Chloride 98  96 - 112 mEq/L   CO2 23  19 - 32 mEq/L   Glucose, Bld 129 (*) 70 - 99 mg/dL   BUN 31 (*) 6 - 23 mg/dL   Creatinine, Ser 1.19  (*) 0.50 -  1.10 mg/dL   Calcium 40.9  8.4 - 81.1 mg/dL   Total Protein 5.0 (*) 6.0 - 8.3 g/dL   Albumin 2.4 (*) 3.5 - 5.2 g/dL   AST 78 (*) 0 - 37 U/L   ALT 16  0 - 35 U/L   Alkaline Phosphatase 128 (*) 39 - 117 U/L   Total Bilirubin 3.2 (*) 0.3 - 1.2 mg/dL   GFR calc non Af Amer 32 (*) >90 mL/min   GFR calc Af Amer 37 (*) >90 mL/min   Comment:            The eGFR has been calculated     using the CKD EPI equation.     This calculation has not been     validated in all clinical     situations.     eGFR's persistently     <90 mL/min signify     possible Chronic Kidney Disease.  LIPASE, BLOOD     Status: None   Collection Time    10/18/12  4:16 PM      Result Value Range   Lipase 32  11 - 59 U/L  LACTIC ACID, PLASMA     Status: None   Collection Time    10/18/12  4:16 PM      Result Value Range   Lactic Acid, Venous 2.0  0.5 - 2.2 mmol/L  TROPONIN I     Status: None   Collection Time    10/18/12  4:16 PM      Result Value Range   Troponin I <0.30  <0.30 ng/mL   Comment:            Due to the release kinetics of cTnI,     a negative result within the first hours     of the onset of symptoms does not rule out     myocardial infarction with certainty.     If myocardial infarction is still suspected,     repeat the test at appropriate intervals.  TYPE AND SCREEN     Status: None   Collection Time    10/18/12  4:16 PM      Result Value Range   ABO/RH(D) A POS     Antibody Screen NEG     Sample Expiration 10/21/2012     Unit Number [id number]     Blood Component Type RED CELLS,LR     Unit division 00     Status of Unit ISSUED     Transfusion Status OK TO TRANSFUSE     Crossmatch Result Compatible     Unit Number [id number]     Blood Component Type RED CELLS,LR     Unit division 00     Status of Unit ISSUED,FINAL     Transfusion Status OK TO TRANSFUSE     Crossmatch Result Compatible     Unit Number [id number]     Blood Component Type RED CELLS,LR      Unit division 00     Status of Unit ISSUED,FINAL     Transfusion Status OK TO TRANSFUSE     Crossmatch Result Compatible    OCCULT BLOOD, POC DEVICE     Status: None   Collection Time    10/18/12  4:38 PM      Result Value Range   Fecal Occult Bld NEGATIVE  NEGATIVE  URINALYSIS W MICROSCOPIC + REFLEX CULTURE     Status: Abnormal   Collection Time    10/18/12  6:09 PM  Result Value Range   Color, Urine AMBER (*) YELLOW   Comment: BIOCHEMICALS MAY BE AFFECTED BY COLOR   APPearance TURBID (*) CLEAR   Specific Gravity, Urine 1.022  1.005 - 1.030   pH 5.0  5.0 - 8.0   Glucose, UA NEGATIVE  NEGATIVE mg/dL   Hgb urine dipstick LARGE (*) NEGATIVE   Bilirubin Urine SMALL (*) NEGATIVE   Ketones, ur NEGATIVE  NEGATIVE mg/dL   Protein, ur 409 (*) NEGATIVE mg/dL   Urobilinogen, UA 1.0  0.0 - 1.0 mg/dL   Nitrite NEGATIVE  NEGATIVE   Leukocytes, UA LARGE (*) NEGATIVE   WBC, UA TOO NUMEROUS TO COUNT  <3 WBC/hpf   Comment: CORRECTED ON 07/28 AT 2100: PREVIOUSLY REPORTED AS FIELD OBSCURED BY WBC'S   RBC / HPF FIELD OBSCURED BY WBC'S  <3 RBC/hpf   Casts FIELD OBSCURED BY WBC'S (*) NEGATIVE  PREPARE RBC (CROSSMATCH)     Status: None   Collection Time    10/18/12  6:54 PM      Result Value Range   Order Confirmation ORDER PROCESSED BY BLOOD BANK    MRSA PCR SCREENING     Status: None   Collection Time    10/18/12  7:10 PM      Result Value Range   MRSA by PCR NEGATIVE  NEGATIVE   Comment:            The GeneXpert MRSA Assay (FDA     approved for NASAL specimens     only), is one component of a     comprehensive MRSA colonization     surveillance program. It is not     intended to diagnose MRSA     infection nor to guide or     monitor treatment for     MRSA infections.  CBC     Status: Abnormal   Collection Time    10/19/12  7:25 AM      Result Value Range   WBC 12.0 (*) 4.0 - 10.5 K/uL   RBC 3.66 (*) 3.87 - 5.11 MIL/uL   Hemoglobin 11.8 (*) 12.0 - 15.0 g/dL   Comment: DELTA  CHECK NOTED     POST TRANSFUSION SPECIMEN   HCT 34.9 (*) 36.0 - 46.0 %   MCV 95.4  78.0 - 100.0 fL   Comment: DELTA CHECK NOTED     POST TRANSFUSION SPECIMEN   MCH 32.2  26.0 - 34.0 pg   MCHC 33.8  30.0 - 36.0 g/dL   RDW 81.1 (*) 91.4 - 78.2 %   Platelets 120 (*) 150 - 400 K/uL   Comment: RESULT REPEATED AND VERIFIED     DELTA CHECK NOTED  COMPREHENSIVE METABOLIC PANEL     Status: Abnormal   Collection Time    10/19/12  7:25 AM      Result Value Range   Sodium 133 (*) 135 - 145 mEq/L   Potassium 4.1  3.5 - 5.1 mEq/L   Comment: DELTA CHECK NOTED     REPEATED TO VERIFY   Chloride 99  96 - 112 mEq/L   CO2 24  19 - 32 mEq/L   Glucose, Bld 88  70 - 99 mg/dL   BUN 29 (*) 6 - 23 mg/dL   Creatinine, Ser 9.56 (*) 0.50 - 1.10 mg/dL   Calcium 9.9  8.4 - 21.3 mg/dL   Total Protein 4.8 (*) 6.0 - 8.3 g/dL   Albumin 2.2 (*) 3.5 - 5.2 g/dL   AST 80 (*)  0 - 37 U/L   ALT 16  0 - 35 U/L   Alkaline Phosphatase 127 (*) 39 - 117 U/L   Total Bilirubin 4.2 (*) 0.3 - 1.2 mg/dL   GFR calc non Af Amer 37 (*) >90 mL/min   GFR calc Af Amer 43 (*) >90 mL/min   Comment:            The eGFR has been calculated     using the CKD EPI equation.     This calculation has not been     validated in all clinical     situations.     eGFR's persistently     <90 mL/min signify     possible Chronic Kidney Disease.  PROTIME-INR     Status: Abnormal   Collection Time    10/19/12  7:25 AM      Result Value Range   Prothrombin Time 21.8 (*) 11.6 - 15.2 seconds   INR 1.97 (*) 0.00 - 1.49  GLUCOSE, CAPILLARY     Status: None   Collection Time    10/19/12  8:17 AM      Result Value Range   Glucose-Capillary 83  70 - 99 mg/dL   Comment 1 Documented in Chart     Comment 2 Notify RN      Dg Chest Port 1 View  10/18/2012   *RADIOLOGY REPORT*  Clinical Data: Weakness, rectal bleeding  PORTABLE CHEST - 1 VIEW  Comparison: 09/01/2012  Findings: Cardiomediastinal silhouette is stable.  Study is limited by poor  inspiration.  Surgical clips in left axilla again noted. No acute infiltrate or pulmonary edema.  Old fracture of the right seventh rib. There is a right IJ Port-A-Cath with tip in SVC right atrium junction.  No diagnostic pneumothorax.  IMPRESSION:  Study is limited by poor inspiration.  Surgical clips in left axilla again noted. No acute infiltrate or pulmonary edema.  Old fracture of the right seventh rib. There is a right IJ Port-A-Cath with tip in SVC right atrium junction.  No diagnostic pneumothorax.   Original Report Authenticated By: Natasha Mead, M.D.                Blood pressure 130/61, pulse 105, temperature 98.1 F (36.7 C), temperature source Oral, resp. rate 20, SpO2 95.00%.  Physical exam:   General-- frail appearing white female Heart-- regular rate and rhythm without murmurs are gallops Lungs--clear with decreased breath sounds Abdomen-- nontender but obvious ascites, bowel sounds are normal Rectal -- massive external hemorrhoids are somewhat tender with probable protruding internal hemorrhoids as well. Digital exam reveals somewhat hard brown stool. Stool sent to lab for fecal occult blood test   Assessment: 1. Rectal bleeding. Almost certainly due to massive external and internal hemorrhoids. Stool is brown on Digital exam. 2. Breast Cancer. Metastatic bone, liver, with malignant ascites.  Plan: I would treat her symptomatically for hemorrhoids with hemorrhoidal cream externally. Given her severe terminal illness I would not recommend surgical therapy for hemorrhoids at this time. We'll also work on her constipation with Miralax.   Forney Kleinpeter JR,Evelyne Makepeace L 10/19/2012, 3:51 PM

## 2012-10-20 ENCOUNTER — Inpatient Hospital Stay (HOSPITAL_COMMUNITY): Payer: Medicare Other

## 2012-10-20 DIAGNOSIS — D649 Anemia, unspecified: Secondary | ICD-10-CM

## 2012-10-20 DIAGNOSIS — K7460 Unspecified cirrhosis of liver: Secondary | ICD-10-CM

## 2012-10-20 LAB — TYPE AND SCREEN
Antibody Screen: NEGATIVE
Unit division: 0
Unit division: 0

## 2012-10-20 LAB — URINE CULTURE: Colony Count: >=100000

## 2012-10-20 LAB — BASIC METABOLIC PANEL
CO2: 23 mEq/L (ref 19–32)
Chloride: 100 mEq/L (ref 96–112)
Creatinine, Ser: 1.37 mg/dL — ABNORMAL HIGH (ref 0.50–1.10)
Glucose, Bld: 148 mg/dL — ABNORMAL HIGH (ref 70–99)
Sodium: 132 mEq/L — ABNORMAL LOW (ref 135–145)

## 2012-10-20 LAB — CBC
HCT: 37.9 % (ref 36.0–46.0)
MCV: 94.8 fL (ref 78.0–100.0)
RBC: 4 MIL/uL (ref 3.87–5.11)
WBC: 11.7 10*3/uL — ABNORMAL HIGH (ref 4.0–10.5)

## 2012-10-20 MED ORDER — SODIUM CHLORIDE 0.9 % IJ SOLN
10.0000 mL | INTRAMUSCULAR | Status: DC | PRN
  Administered 2012-10-20 – 2012-10-22 (×4): 10 mL

## 2012-10-20 MED ORDER — ENSURE COMPLETE PO LIQD
237.0000 mL | Freq: Two times a day (BID) | ORAL | Status: DC
  Administered 2012-10-21 – 2012-10-22 (×3): 237 mL via ORAL

## 2012-10-20 MED ORDER — HYDROCORTISONE ACE-PRAMOXINE 2.5-1 % RE CREA
TOPICAL_CREAM | Freq: Three times a day (TID) | RECTAL | Status: DC
  Administered 2012-10-20 – 2012-10-21 (×5): via RECTAL
  Administered 2012-10-22: 1 via RECTAL
  Filled 2012-10-20: qty 30

## 2012-10-20 MED ORDER — SODIUM CHLORIDE 0.45 % IV SOLN
INTRAVENOUS | Status: DC
  Administered 2012-10-20: 18:00:00 via INTRAVENOUS

## 2012-10-20 MED ORDER — POLYETHYLENE GLYCOL 3350 17 G PO PACK
17.0000 g | PACK | Freq: Every day | ORAL | Status: DC | PRN
Start: 2012-10-20 — End: 2012-10-22
  Filled 2012-10-20: qty 1

## 2012-10-20 NOTE — Procedures (Signed)
Successful US guided paracentesis from LLQ.  Yielded 5.1 Liters of turbid yellow fluid.  No immediate complications.  Pt tolerated well.   Specimen was not sent for labs.  Pattricia Boss D PA-C 10/20/2012 11:45 AM

## 2012-10-20 NOTE — Progress Notes (Signed)
[facility] PROGRESS NOTE Subjective patient's anal area feels a little better with the Anusol HC cream. Her stools were Hemoccult negative  Objective: Vital signs in last 24 hours: Temp:  [97.6 F (36.4 C)-98.2 F (36.8 C)] 98.2 F (36.8 C) (07/30 0511) Pulse Rate:  [79-105] 100 (07/30 0511) Resp:  [15-20] 18 (07/30 0511) BP: (112-134)/(45-72) 116/63 mmHg (07/30 0511) SpO2:  [95 %-96 %] 95 % (07/30 0511) Weight:  [72 kg (158 lb 11.7 oz)] 72 kg (158 lb 11.7 oz) (07/29 1635) Last BM Date: 10/19/12  Intake/Output from previous day: 07/29 0701 - 07/30 0700 In: 1185 [P.O.:360; I.V.:825] Out: -  Intake/Output this shift:      Lab Results:  Recent Labs  10/18/12 1616 10/19/12 0725  WBC 16.3* 12.0*  HGB 6.3* 11.8*  HCT 19.2* 34.9*  PLT 173 120*   BMET  Recent Labs  10/18/12 1616 10/19/12 0725 10/20/12 0525  NA 129* 133* 132*  K 5.2* 4.1 4.0  CL 98 99 100  CO2 23 24 23   CREATININE 1.56* 1.40* 1.37*   LFT  Recent Labs  10/18/12 1616 10/19/12 0725  PROT 5.0* 4.8*  AST 78* 80*  ALT 16 16  ALKPHOS 128* 127*  BILITOT 3.2* 4.2*   PT/INR  Recent Labs  10/18/12 1616 10/19/12 0725  LABPROT 22.1* 21.8*  INR 2.00* 1.97*   PANCREAS  Recent Labs  10/18/12 1616  LIPASE 32         Studies/Results: Dg Chest Port 1 View  10/18/2012   *RADIOLOGY REPORT*  Clinical Data: Weakness, rectal bleeding  PORTABLE CHEST - 1 VIEW  Comparison: 09/01/2012  Findings: Cardiomediastinal silhouette is stable.  Study is limited by poor inspiration.  Surgical clips in left axilla again noted. No acute infiltrate or pulmonary edema.  Old fracture of the right seventh rib. There is a right IJ Port-A-Cath with tip in SVC right atrium junction.  No diagnostic pneumothorax.  IMPRESSION:  Study is limited by poor inspiration.  Surgical clips in left axilla again noted. No acute infiltrate or pulmonary edema.  Old fracture of the right seventh rib. There is a right IJ  Port-A-Cath with tip in SVC right atrium junction.  No diagnostic pneumothorax.   Original Report Authenticated By: Natasha Mead, M.D.    Medications: I have reviewed the patient's current medications.  Assessment/Plan: 1. Rectal bleeding. Stools were negative and rectal wall and she does have very large internal and external hemorrhoids. This is clearly the source or bleeding. She is having diarrhea with Miralax 3 times daily. We will go ahead and decrease this once daily. I would go ahead and adjust the dose as needed to keep her stools soft. I have discussed this with she and her husband. Please give [place] a call if we can be of any further help.   Jehu Mccauslin JR,Regnia Mathwig L 10/20/2012, 8:04 AM

## 2012-10-20 NOTE — Progress Notes (Signed)
[facility] PROGRESS NOTE Interim History: 72 y.o. 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 It seemed to worsen 7/29-was BRB per rectum, GI consulted,m s/p 3 units 7.29.2014.    Assessment/Plan: Acute blood loss anemia/ Lower GI bleed/  Anemia, likely from prominent hemorrhoids and resolving - Baseline Hbg 9.0 drop to 6.3 s/p 3 units of PRBC  7.29.2014 now stable near 12. - Appreciate GI recommendations:  Stool softeners.   - 2007 colonoscopy with in normal  Coagulopathy:  Unclear etiology, not on coumadin or NOAC's at home but has numerous liver mets and therefore may be at risk for cirrhosis/liver dysfunction.  . -  Elevated INR, chronic mild elevation of LFT'sascities, skin angiodisplasia, liver ultrasound -  S/p vit K.  AKI:   most likely pre-renal and resolving with hydration, however, does have history of unilateral hydronephrosis -  Change to lower sodium fluid to prevent quick reaccumulation of ascites -  baseline cr 1.0 -  Repeat in AM   Leukocytosis, unspecified, stable.   Monitor for infection   Breast cancer metastasized to multiple sites including liver:   -  Appreciate Onc. Assistance -  Will attempt to maximally improve kidney function so that she may get her CT done as soon as possible.  Malignant ascities:  Improved post paracentesis.    - patient discussed possibility of palliative catheter placement with me today  H/o malignant hydronephrosis:   - Follow up OP with Dr. Retta Diones, Urology  Hypothyroid, stable.  continue Syntrhoid 75 mcg daily  Moderate protein calorie malnutrition -  Start supplements -  Regular diet  Code Status: DNR  Family Communication: spoke with husband at bedside  Disposition Plan:  Continue inpatient.  Possible discharge to home tomorrow if kidney function continues to improve and bleeding has  stopped    Consultants:  Oncology  Gastroenterology  Procedures:  Paracentesis  Antibiotics:  None  HPI/Subjective:  She relates no bloody BM since yesterday.  Abdomen feels better after paracentesis today.   Objective: Filed Vitals:   10/20/12 1108 10/20/12 1116 10/20/12 1128 10/20/12 1402  BP: 163/88 159/74 147/67 117/52  Pulse:    104  Temp:    98.7 F (37.1 C)  TempSrc:    Oral  Resp:    18  Height:      Weight:      SpO2:    97%    Intake/Output Summary (Last 24 hours) at 10/20/12 1717 Last data filed at 10/20/12 0700  Gross per 24 hour  Intake   1200 ml  Output      0 ml  Net   1200 ml   Filed Weights   10/19/12 1635  Weight: 72 kg (158 lb 11.7 oz)    Exam:  General: Cachectic Caucasian female, Alert, awake, oriented x3, in no acute distress.  HEENT: No bruits, no goiter.  Heart: Regular rate and rhythm, without murmurs, rubs, gallops.  Lungs: Good air movement, clear to auscultation  Abdomen: Soft, nontender, nondistended, positive bowel sounds.  Neuro: Grossly intact, nonfocal. MSK: 1+ pitting edema of bilateral lower extremities   Data Reviewed: Basic Metabolic Panel:  Recent Labs Lab 10/18/12 1616 10/19/12 0725 10/20/12 0525  NA 129* 133* 132*  K 5.2* 4.1 4.0  CL 98 99 100  CO2 23 24 23   GLUCOSE 129* 88 148*  BUN 31* 29* 30*  CREATININE 1.56*  1.40* 1.37*  CALCIUM 10.3 9.9 9.5   Liver Function Tests:  Recent Labs Lab 10/18/12 1616 10/19/12 0725  AST 78* 80*  ALT 16 16  ALKPHOS 128* 127*  BILITOT 3.2* 4.2*  PROT 5.0* 4.8*  ALBUMIN 2.4* 2.2*    Recent Labs Lab 10/18/12 1616  LIPASE 32   No results found for this basename: AMMONIA,  in the last 168 hours CBC:  Recent Labs Lab 10/18/12 1616 10/19/12 0725 10/20/12 0805  WBC 16.3* 12.0* 11.7*  NEUTROABS 10.5*  --   --   HGB 6.3* 11.8* 12.8  HCT 19.2* 34.9* 37.9  MCV 108.5* 95.4 94.8  PLT 173 120* 129*   Cardiac Enzymes:  Recent Labs Lab 10/18/12 1616   TROPONINI <0.30   BNP (last 3 results) No results found for this basename: PROBNP,  in the last 8760 hours CBG:  Recent Labs Lab 10/19/12 0817 10/20/12 0734  GLUCAP 83 136*    Recent Results (from the past 240 hour(s))  URINE CULTURE     Status: None   Collection Time    10/18/12  6:09 PM      Result Value Range Status   Specimen Description URINE, CLEAN CATCH   Final   Special Requests NONE   Final   Culture  Setup Time 10/18/2012 22:25   Final   Colony Count >=100,000 COLONIES/ML   Final   Culture ENTEROBACTER CLOACAE   Final   Report Status 10/20/2012 FINAL   Final   Organism ID, Bacteria ENTEROBACTER CLOACAE   Final  MRSA PCR SCREENING     Status: None   Collection Time    10/18/12  7:10 PM      Result Value Range Status   MRSA by PCR NEGATIVE  NEGATIVE Final   Comment:            The GeneXpert MRSA Assay (FDA     approved for NASAL specimens     only), is one component of a     comprehensive MRSA colonization     surveillance program. It is not     intended to diagnose MRSA     infection nor to guide or     monitor treatment for     MRSA infections.     Studies: US Paracentesis  10/20/2012   *RADIOLOGY REPORT*  Clinical Data: Ascites  ULTRASOUND GUIDED PARACENTESIS  An ultrasound guided paracentesis was thoroughly discussed with the patient and questions answered.  The benefits, risks, alternatives and complications were also discussed.  The patient understands and wishes to proceed with the procedure.  Written consent was obtained.  Ultrasound was performed to localize and mark an adequate pocket of fluid in the left lower quadrant of the abdomen.  The area was then prepped and draped in the normal sterile fashion.  1% Lidocaine was used for local anesthesia.  Under ultrasound guidance a 19 gauge Yueh catheter was introduced.  Paracentesis was performed.  The catheter was removed and a dressing applied.  Complications:  none  Findings:  A total of approximately 5.1  liters of turbid yellow fluid was removed.  A fluid sample was not sent for laboratory analysis.  IMPRESSION: Successful ultrasound guided paracentesis yielding 5.1 liters of ascites.  Read By:  Pattricia Boss PA-C   Original Report Authenticated By: Jolaine Click, M.D.    Scheduled Meds: . docusate sodium  100 mg Oral Daily  . FLUoxetine  20 mg Oral Q1200  . furosemide  20 mg Intravenous Once  .  hydrocortisone  25 mg Rectal BID  . hydrocortisone-pramoxine   Rectal TID  . levothyroxine  75 mcg Oral Q0600  . pantoprazole  40 mg Oral Daily  . polyethylene glycol  17 g Oral TID  . sodium chloride  3 mL Intravenous Q12H  . sucralfate  1 g Oral TID AC & HS   Continuous Infusions: . sodium chloride 75 mL/hr at 10/19/12 2227     Raelyn Racette, [facility]  [facility] Pager [phone]. If 8PM-8AM, please contact night-coverage at [email or web address], password [facility] 10/20/2012, 5:17 PM  LOS: 2 days

## 2012-10-20 NOTE — Consult Note (Signed)
Kelli [name]   DOB:09-May-1940   WG#:[id number]   [id number]  Subjective: Kelli [name] is moderately well this morning.  She has had no further bleeding.  The patient and the husband report that they saw the gastroenterologist this morning and was reassured that she had hemorrhoids and for the patient to do sitz baths, and continue Miralax BID.  She is doing well.  Just weak.  She has not gotten out of bed according to the two of them. She is scheduled for a paracentesis today.  Otherwise, a 10 point ROS is neg.    Objective:  Filed Vitals:   10/20/12 0511  BP: 116/63  Pulse: 100  Temp: 98.2 F (36.8 C)  Resp: 18    Body mass index is 26.41 kg/(m^2).  Intake/Output Summary (Last 24 hours) at 10/20/12 1039 Last data filed at 10/20/12 0700  Gross per 24 hour  Intake   1545 ml  Output      0 ml  Net   1545 ml     Sclerae unicteric  Oropharynx clear  No peripheral adenopathy  Lungs clear -- no rales or rhonchi  Heart regular rate and rhythm  Abdomen distended, mild tenderness in LLQ,  normoactive bowel sounds  MSK no focal spinal tenderness  Neuro nonfocal    CBG (last 3)   Recent Labs  10/19/12 0817 10/20/12 0734  GLUCAP 83 136*     Labs:  Lab Results  Component Value Date   WBC 11.7* 10/20/2012   HGB 12.8 10/20/2012   HCT 37.9 10/20/2012   MCV 94.8 10/20/2012   PLT 129* 10/20/2012   NEUTROABS 10.5* 10/18/2012    Urine Studies No results found for this basename: UACOL, UAPR, USPG, UPH, UTP, UGL, UKET, UBIL, UHGB, UNIT, UROB, ULEU, UEPI, UWBC, URBC, UBAC, CAST, CRYS, UCOM, BILUA,  in the last 72 hours  Basic Metabolic Panel:  Recent Labs Lab 10/13/12 1312  10/18/12 1616 10/19/12 0725 10/20/12 0525  NA 135*  --  129* 133* 132*  K 4.2  < > 5.2* 4.1 4.0  CL  --   --  98 99 100  CO2 23  --  23 24 23   GLUCOSE 162*  --  129* 88 148*  BUN 13.6  --  31* 29* 30*  CREATININE 1.1  --  1.56* 1.40* 1.37*  CALCIUM 9.5  --  10.3 9.9 9.5  < > = values in this  interval not displayed. GFR Estimated Creatinine Clearance: 37.5 ml/min (by C-G formula based on Cr of 1.37). Liver Function Tests:  Recent Labs Lab 10/13/12 1312 10/18/12 1616 10/19/12 0725  AST 85* 78* 80*  ALT 17 16 16   ALKPHOS 133 128* 127*  BILITOT 3.35* 3.2* 4.2*  PROT 5.3* 5.0* 4.8*  ALBUMIN 2.6* 2.4* 2.2*    Recent Labs Lab 10/18/12 1616  LIPASE 32   No results found for this basename: AMMONIA,  in the last 168 hours Coagulation profile  Recent Labs Lab 10/18/12 1616 10/19/12 0725  INR 2.00* 1.97*    CBC:  Recent Labs Lab 10/13/12 1312 10/18/12 1616 10/19/12 0725 10/20/12 0805  WBC 13.3* 16.3* 12.0* 11.7*  NEUTROABS 8.3* 10.5*  --   --   HGB 9.7* 6.3* 11.8* 12.8  HCT 29.3* 19.2* 34.9* 37.9  MCV 109.6* 108.5* 95.4 94.8  PLT 114* 173 120* 129*   Cardiac Enzymes:  Recent Labs Lab 10/18/12 1616  TROPONINI <0.30   BNP: No components found with this basename: POCBNP,  CBG:  Recent Labs Lab 10/19/12 0817 10/20/12 0734  GLUCAP 83 136*   D-Dimer No results found for this basename: DDIMER,  in the last 72 hours Hgb A1c No results found for this basename: HGBA1C,  in the last 72 hours Lipid Profile No results found for this basename: CHOL, HDL, LDLCALC, TRIG, CHOLHDL, LDLDIRECT,  in the last 72 hours Thyroid function studies No results found for this basename: TSH, T4TOTAL, FREET3, T3FREE, THYROIDAB,  in the last 72 hours Anemia work up No results found for this basename: VITAMINB12, FOLATE, FERRITIN, TIBC, IRON, RETICCTPCT,  in the last 72 hours Microbiology Recent Results (from the past 240 hour(s))  URINE CULTURE     Status: None   Collection Time    10/18/12  6:09 PM      Result Value Range Status   Specimen Description URINE, CLEAN CATCH   Final   Special Requests NONE   Final   Culture  Setup Time 10/18/2012 22:25   Final   Colony Count >=100,000 COLONIES/ML   Final   Culture GRAM NEGATIVE RODS   Final   Report Status PENDING    Incomplete  MRSA PCR SCREENING     Status: None   Collection Time    10/18/12  7:10 PM      Result Value Range Status   MRSA by PCR NEGATIVE  NEGATIVE Final   Comment:            The GeneXpert MRSA Assay (FDA     approved for NASAL specimens     only), is one component of a     comprehensive MRSA colonization     surveillance program. It is not     intended to diagnose MRSA     infection nor to guide or     monitor treatment for     MRSA infections.      Studies:  Dg Chest Port 1 View  10/18/2012   *RADIOLOGY REPORT*  Clinical Data: Weakness, rectal bleeding  PORTABLE CHEST - 1 VIEW  Comparison: 09/01/2012  Findings: Cardiomediastinal silhouette is stable.  Study is limited by poor inspiration.  Surgical clips in left axilla again noted. No acute infiltrate or pulmonary edema.  Old fracture of the right seventh rib. There is a right IJ Port-A-Cath with tip in SVC right atrium junction.  No diagnostic pneumothorax.  IMPRESSION:  Study is limited by poor inspiration.  Surgical clips in left axilla again noted. No acute infiltrate or pulmonary edema.  Old fracture of the right seventh rib. There is a right IJ Port-A-Cath with tip in SVC right atrium junction.  No diagnostic pneumothorax.   Original Report Authenticated By: Natasha Mead, M.D.    Assessment/Plan: 72 y.o. female with   1. Stage IV breast cancer, patient receiving Faslodex and Zometa.  Currently on hold due to current illness.  CT chest/abd/pelvis would be preferential to do while patient is in house due to patient requiring a steroid prep, but patient has AKI and cannot receive contrast currently.  Will wait for kidney recovery.     2. Rectal Bleeding: patient has received 3 units of blood.  Recommendations per GI  3. Ascites: Paracentesis today.    4. Other medical problems as per the hospitalist team, thank you for providing excellent care to our patient.    5. Recommend physical therapy to eval and treat for patient  strength and discharge planning.    Cherie Ouch Lyn Hollingshead, NP Medical Oncology [facility] Phone: [phone]  [phone] Augustin Schooling 10/20/2012   ATTENDING'S ATTESTATION:  I personally reviewed patient's chart, examined patient myself, formulated the treatment plan.    Drue Second, MD Medical/Oncology [facility] [phone] (beeper) [phone] (Office)

## 2012-10-20 NOTE — Progress Notes (Signed)
General surgery attending note:  The patient is feeling better and her perianal area. She is on sitz baths and steroid cream. The pain is less and the bleeding has stopped. She has apparently had a colonoscopy within the last 2 years bipedal GI.  Her hemorrhoids are circumferential and a little bit of edematous but there is no thrombosis and they are only mildly tender.  Recommendation: Continue medical management of her hemorrhoids. Sitz baths and Anusol HC cream We will sign off. Please reconsult if surgical problems arise.   Kelli [name]. Derrell Lolling, M.D., [facility] General and Minimally invasive Surgery Breast and Colorectal Surgery Office:   [phone] Pager:   [phone]

## 2012-10-21 LAB — BASIC METABOLIC PANEL
CO2: 21 mEq/L (ref 19–32)
Chloride: 100 mEq/L (ref 96–112)
Creatinine, Ser: 1.1 mg/dL (ref 0.50–1.10)
Sodium: 131 mEq/L — ABNORMAL LOW (ref 135–145)

## 2012-10-21 LAB — GLUCOSE, CAPILLARY

## 2012-10-21 LAB — CBC
HCT: 34.7 % — ABNORMAL LOW (ref 36.0–46.0)
MCV: 96.4 fL (ref 78.0–100.0)
RBC: 3.6 MIL/uL — ABNORMAL LOW (ref 3.87–5.11)
WBC: 10.4 10*3/uL (ref 4.0–10.5)

## 2012-10-21 MED ORDER — CIPROFLOXACIN HCL 500 MG PO TABS
500.0000 mg | ORAL_TABLET | Freq: Two times a day (BID) | ORAL | Status: DC
  Administered 2012-10-21 – 2012-10-22 (×2): 500 mg via ORAL
  Filled 2012-10-21 (×4): qty 1

## 2012-10-21 NOTE — Care Management Note (Addendum)
    Page 1 of 2   10/22/2012     2:44:47 PM   CARE MANAGEMENT NOTE 10/22/2012  Patient:  Kelli [name],Kelli [name]   Account Number:  [id number]  Date Initiated:  10/21/2012  Documentation initiated by:  Lanier Clam  Subjective/Objective Assessment:   ADMITTED W/LOWER GIB.YN:WGNFAOZ,HYQMVH CA.     Action/Plan:   FROM HOME W/SPOUSE.HAS PCP,PHARMACY.   Anticipated DC Date:  10/22/2012   Anticipated DC Plan:  HOME W HOME HEALTH SERVICES      DC Planning Services  CM consult      Choice offered to / List presented to:  C-1 Patient        HH arranged  HH-1 RN  HH-2 PT  HH-3 OT      [facility] agency  [facility]   Status of service:  Completed, signed off Medicare Important Message given?   (If response is "NO", the following Medicare IM given date fields will be blank) Date Medicare IM given:   Date Additional Medicare IM given:    Discharge Disposition:  HOME W HOME HEALTH SERVICES  Per UR Regulation:  Reviewed for med. necessity/level of care/duration of stay  If discussed at Long Length of Stay Meetings, dates discussed:    Comments:  10/22/12 Kimyatta Lecy RN,BSN NCM [phone] [facility] JAMIE AWARE OF HH ORDERS. HOME DME ITEM-SITZ BATH IS A PRIVATE PAY ITEM THEY HAVE IN STOCK @ [facility] [address],[place] $6,SPOKE TO [facility] DME REP.WILL INFORM PATIENT.  10/21/12 Melvie Paglia RN,BSN NCM [phone] [facility] CHOSEN IF HHC NEEDED.TC [facility] REP TEL#[phone] FOLLOWING. S/P PARACENTESIS YESTERDAY.PROVIDED [facility] AGENCY LIST AS RESOURCE.RECOMMEND PT/OT CONS.

## 2012-10-21 NOTE — Progress Notes (Signed)
ANTIBIOTIC CONSULT NOTE - INITIAL  Pharmacy Consult for Cipro Indication: UTI  Allergies  Allergen Reactions  . Cephalexin Nausea Only  . Contrast Media (Iodinated Diagnostic Agents) Rash    GIVE BENADRYL PRIOR  . Other Nausea And Vomiting and Rash    "MYCIN" DRUGS  . Sulfa Antibiotics Nausea And Vomiting and Rash    Patient Measurements: Height: 5\' 5"  (165.1 cm) Weight: 158 lb 11.7 oz (72 kg) IBW/kg (Calculated) : 57 Adjusted Body Weight:   Vital Signs: Temp: 97.9 F (36.6 C) (07/31 1415) Temp src: Oral (07/31 1415) BP: 120/63 mmHg (07/31 1415) Pulse Rate: 106 (07/31 1415) Intake/Output from previous day: 07/30 0701 - 07/31 0700 In: 961.3 [I.V.:961.3] Out: -  Intake/Output from this shift:    Labs:  Recent Labs  10/19/12 0725 10/20/12 0525 10/20/12 0805 10/21/12 0416  WBC 12.0*  --  11.7* 10.4  HGB 11.8*  --  12.8 11.7*  PLT 120*  --  129* 115*  CREATININE 1.40* 1.37*  --  1.10   Estimated Creatinine Clearance: 46.7 ml/min (by C-G formula based on Cr of 1.1). No results found for this basename: VANCOTROUGH, Leodis Binet, VANCORANDOM, GENTTROUGH, GENTPEAK, GENTRANDOM, TOBRATROUGH, TOBRAPEAK, TOBRARND, AMIKACINPEAK, AMIKACINTROU, AMIKACIN,  in the last 72 hours   Microbiology: Recent Results (from the past 720 hour(s))  URINE CULTURE     Status: None   Collection Time    10/18/12  6:09 PM      Result Value Range Status   Specimen Description URINE, CLEAN CATCH   Final   Special Requests NONE   Final   Culture  Setup Time 10/18/2012 22:25   Final   Colony Count >=100,000 COLONIES/ML   Final   Culture ENTEROBACTER CLOACAE   Final   Report Status 10/20/2012 FINAL   Final   Organism ID, Bacteria ENTEROBACTER CLOACAE   Final  MRSA PCR SCREENING     Status: None   Collection Time    10/18/12  7:10 PM      Result Value Range Status   MRSA by PCR NEGATIVE  NEGATIVE Final   Comment:            The GeneXpert MRSA Assay (FDA     approved for NASAL specimens   only), is one component of a     comprehensive MRSA colonization     surveillance program. It is not     intended to diagnose MRSA     infection nor to guide or     monitor treatment for     MRSA infections.    Medical History: Past Medical History  Diagnosis Date  . Thyroid disease IN 1982    HX IODINE-131 ABLATION FOR HYPERTHYROIDISM  . Osteoporosis   . Vitamin D insufficiency   . Chronic fatigue   . Depression   . Anxiety   . Insomnia   . History of chemotherapy     Adriamycin/cytoxan followed by [facility]  08/2000/tamoxifen 06/2001-02/2003,then arimidex 02/2003-08/2009  . 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 History of radiation therapy 12/28/00-02/12/01    left breast  With Dr. Jamie Kato  . Hypothyroidism   . Fatigue   . Short of breath on exertion   . Breast cancer, left JUNE OF 2002    LEFT BREAST, ER/PR +, HER 2 -  . 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 Metastatic cancer to liver 09/24/11    CURRENTLY RECEIVING RADIATION  . Radiation 10/15/11-10/28/2011  left proximal leg  . Hydronephrosis, left     SECONDARY TO RETROPERITONEAL METS   Assessment: 86 yoM hospitalized with ABLA with coagulopathy, elevated INR and LFTs s/p VitK, AKI, now resolved along with ascites s/p paracentesis.  PMHx pertinent for metastatic breast cancer to liver and other sites.  Pt now found to have enterobacter UTI and pharmacy asked to dose ciprofloxacin.   Renal fxn: SCr 1.10, CrCl ~ 47 ml/min  WBC WNL  Tm24h: Afebrile  Cultures:  UTI enterobacter: Pan sensitive except R cefazolin  Allergies: Cephalexin, Sulfa  Plan:  1.  Ciprofloxacin 500mg  po q 12 hours   Dam Ashraf E 10/21/2012,2:39 PM

## 2012-10-21 NOTE — Progress Notes (Signed)
Sitz bath given at 20:40 pm using bed pan with warm water.  Patient states that it feels very good on her bottom.  Tolerated for 15 minutes.  Will continue to monitor patient.

## 2012-10-21 NOTE — Progress Notes (Signed)
[facility] PROGRESS NOTE Interim History: 72 y.o. 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 It seemed to worsen 7/29-was BRB per rectum, GI consulted,m s/p 3 units 7.29.2014.    Assessment/Plan: Acute blood loss anemia: The patient had a baseline hemoglobin of approximately 9 and it decreased to 6.3 mg/dL during admission. She had recurrent episodes of rectal bleeding which slowly tapered. She was transfused 3 units of packed red blood cells and her hemoglobin increased to approximately 12 and has remained stable for the last several days. She was seen by gastroenterology who recommended against any further procedures at this time and counseled her to use stool softeners to prevent hemorrhoids. She was also prescribed Anusol. She has a history of very large internal and external hemorrhoids which gastroenterology felt was the most likely explanation for her bleeding.  Coagulopathy, mildly elevated INR and elevated LFTs with ascites suggestive of underlying cirrhosis. This may be secondary to her history of metastatic cancer to the liver. She was given a dose of vitamin K. She had no evidence of further bleeding prior to discharge.  Acute kidney injury, baseline creatinine of 0.9. Her initial creatinine was 1.56 and it trended down to 1.10 prior to discharge. This is most likely due to dehydration secondary to her acute bleeding and resolved with IV fluids.  Leukocytosis, may have been reactive from acute GI bleed in trended down.  Breast cancer with metastases to multiple sites including the liver. She was followed by oncology who recommended that she have a CAT scan done for restaging after kidney function has returned to normal. Would recommend waiting for at least one more week prior to obtaining the CT scan to minimize her risk of recurrent acute kidney injury.   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 She discussed the possibility of a palliative catheter to prevent her from needing recurrent paracentesis, however after discussion of the risks and benefits of recurrent paracentesis versus catheter placement she is elected to continue when necessary paracentesis for now.  Malignant hydronephrosis, followup with Doctor Dahlstedt from Urology.  Hypothyroidism, stable. She continued Synthroid daily.  Moderate protein calorie malnutrition. She was started on supplements and given a regular diet.  Generalized weakness -  PT evaluation  Enterobacter UTI -  Start ciprofloxacin  Code Status: DNR  Family Communication: spoke with husband at bedside  Disposition Plan:  PT evaluation, then discharge.    Consultants:  Oncology  Gastroenterology  Procedures:  Paracentesis  Antibiotics:  None  HPI/Subjective:  She relates no bloody BM since yesterday, soft brown.  Fluid has accumulated some in her abdomen.    Objective: Filed Vitals:   10/20/12 1128 10/20/12 1402 10/20/12 2135 10/21/12 0443  BP: 147/67 117/52 132/61 130/76  Pulse:  104 100 114  Temp:  98.7 F (37.1 C) 98.2 F (36.8 C) 97.5 F (36.4 C)  TempSrc:  Oral Oral Oral  Resp:  18 18 18   Height:      Weight:      SpO2:  97% 97% 94%    Intake/Output Summary (Last 24 hours) at 10/21/12 1410 Last data filed at 10/21/12 0700  Gross per 24 hour  Intake 961.25 ml  Output      0 ml  Net 961.25 ml   Filed Weights   10/19/12 1635  Weight: 72 kg (158  lb 11.7 oz)    Exam:  General: Cachectic Caucasian female, Alert, awake, oriented x3, in no acute distress, sitting in chair HEENT: No bruits, no goiter.  Heart: Regular rate and rhythm, without murmurs, rubs, gallops.  Lungs: Good air movement, clear to auscultation  Abdomen: Soft, nontender, mildly distended, positive bowel sounds.  Neuro: Grossly intact,  nonfocal. MSK: 1+ pitting edema of bilateral lower extremities   Data Reviewed: Basic Metabolic Panel:  Recent Labs Lab 10/18/12 1616 10/19/12 0725 10/20/12 0525 10/21/12 0416  NA 129* 133* 132* 131*  K 5.2* 4.1 4.0 3.7  CL 98 99 100 100  CO2 23 24 23 21   GLUCOSE 129* 88 148* 154*  BUN 31* 29* 30* 27*  CREATININE 1.56* 1.40* 1.37* 1.10  CALCIUM 10.3 9.9 9.5 9.6   Liver Function Tests:  Recent Labs Lab 10/18/12 1616 10/19/12 0725  AST 78* 80*  ALT 16 16  ALKPHOS 128* 127*  BILITOT 3.2* 4.2*  PROT 5.0* 4.8*  ALBUMIN 2.4* 2.2*    Recent Labs Lab 10/18/12 1616  LIPASE 32   No results found for this basename: AMMONIA,  in the last 168 hours CBC:  Recent Labs Lab 10/18/12 1616 10/19/12 0725 10/20/12 0805 10/21/12 0416  WBC 16.3* 12.0* 11.7* 10.4  NEUTROABS 10.5*  --   --   --   HGB 6.3* 11.8* 12.8 11.7*  HCT 19.2* 34.9* 37.9 34.7*  MCV 108.5* 95.4 94.8 96.4  PLT 173 120* 129* 115*   Cardiac Enzymes:  Recent Labs Lab 10/18/12 1616  TROPONINI <0.30   BNP (last 3 results) No results found for this basename: PROBNP,  in the last 8760 hours CBG:  Recent Labs Lab 10/19/12 0817 10/20/12 0734 10/21/12 0655  GLUCAP 83 136* 149*    Recent Results (from the past 240 hour(s))  URINE CULTURE     Status: None   Collection Time    10/18/12  6:09 PM      Result Value Range Status   Specimen Description URINE, CLEAN CATCH   Final   Special Requests NONE   Final   Culture  Setup Time 10/18/2012 22:25   Final   Colony Count >=100,000 COLONIES/ML   Final   Culture ENTEROBACTER CLOACAE   Final   Report Status 10/20/2012 FINAL   Final   Organism ID, Bacteria ENTEROBACTER CLOACAE   Final  MRSA PCR SCREENING     Status: None   Collection Time    10/18/12  7:10 PM      Result Value Range Status   MRSA by PCR NEGATIVE  NEGATIVE Final   Comment:            The GeneXpert MRSA Assay (FDA     approved for NASAL specimens     only), is one component of a      comprehensive MRSA colonization     surveillance program. It is not     intended to diagnose MRSA     infection nor to guide or     monitor treatment for     MRSA infections.     Studies: US Paracentesis  10/20/2012   *RADIOLOGY REPORT*  Clinical Data: Ascites  ULTRASOUND GUIDED PARACENTESIS  An ultrasound guided paracentesis was thoroughly discussed with the patient and questions answered.  The benefits, risks, alternatives and complications were also discussed.  The patient understands and wishes to proceed with the procedure.  Written consent was obtained.  Ultrasound was performed to localize and mark an adequate  pocket of fluid in the left lower quadrant of the abdomen.  The area was then prepped and draped in the normal sterile fashion.  1% Lidocaine was used for local anesthesia.  Under ultrasound guidance a 19 gauge Yueh catheter was introduced.  Paracentesis was performed.  The catheter was removed and a dressing applied.  Complications:  none  Findings:  A total of approximately 5.1 liters of turbid yellow fluid was removed.  A fluid sample was not sent for laboratory analysis.  IMPRESSION: Successful ultrasound guided paracentesis yielding 5.1 liters of ascites.  Read By:  Pattricia Boss PA-C   Original Report Authenticated By: Jolaine Click, M.D.    Scheduled Meds: . docusate sodium  100 mg Oral Daily  . feeding supplement  237 mL Oral BID BM  . FLUoxetine  20 mg Oral Q1200  . furosemide  20 mg Intravenous Once  . hydrocortisone  25 mg Rectal BID  . hydrocortisone-pramoxine   Rectal TID  . levothyroxine  75 mcg Oral Q0600  . pantoprazole  40 mg Oral Daily  . polyethylene glycol  17 g Oral TID  . sodium chloride  3 mL Intravenous Q12H  . sucralfate  1 g Oral TID AC & HS   Continuous Infusions:     Yamilex Borgwardt  [facility] Pager [phone]. If 8PM-8AM, please contact night-coverage at [email or web address], password [facility] 10/21/2012, 2:10 PM  LOS: 3 days

## 2012-10-21 NOTE — Progress Notes (Signed)
[facility]  Telephone:([phone]) [phone]   DIAGNOSIS: 72 year old female with stage IV invasive ductal carcinoma.  PRIOR THERAPY:   1.  History is of T1N3 invasive ductal left breast cancer diagnosed June 2002 with 13 nodes involved including extracapsular extension, ER + 67%, PR + 39%, HER-2/neu negative. She was treated with lumpectomy and 18 node axillary dissection, adjuvant adriamycin/cytoxan followed by [facility] chemotherapy, local radiation, tamoxifen from April 2003 thru Dec 2003 then arimidex from Dec 2004 thru June 2011. The arimidex was continued for that duration due to high risk features, stopped in June 2011 as there was no evidence of disease and with known osteoporosis.   2.  She developed acute pain in left hip in June 2013, with MRI hip 09-20-11 showing metastatic disease with pathologic fracture of femoral neck, nailing done by Dr Victorino Dike on 09-24-11. Pathology confirmed metastatic breast carcinoma, ER+ 100%, PR negative and HER 2 negative by [facility]. She had left ureteral stent by Dr Retta Diones 11-01-11 for obstructive hydronephrosis.   3.  She received radiation 3000 cGy in 10 fractions to left femur 7-24 thru 10-28-11 by Dr Roselind Messier. Bolero 4 study was started 11-12-11. Everolimus dose was reduced due to epistaxis. Restaging scans done per protocol in Dec 2013 (bone scan, CT chest, MRI abdomen and pelvis) all showed further improvement in metastatic disease, without other progression. Everolimus was discontinued after 03-03-12 due to dyspnea. Respiratory problems were evaluated with VQ, echocardiogram and bubble echo, and PFTs by Dr Craige Cotta, and the SOB progressively improved off of everolimus. She was continued on Letrozole and Zometa.  She had left ureteral stent changed by Dr Retta Diones on 07-22-2012. CA 2729 had been 354 in July 2013, down to 224 in Dec 2013 when everolimus DCd,   4.  CA 27-29 then was 299 in March 2014, 448 in April and was up to 639 by labs drawn on 07-27-12. Letrozole  was discontinued on 07-28-12 and first taxol given 08-12-12 (80 mg/m2), not tolerated well due to cytopenias. She is also on zometa given every 4 weeks.    5. She was started on Faslodex by Dr. Darrold Span on 09/14/12.    CURRENT THERAPY: Faslodex      HOSPITAL PROGRESS NOTE  Events since  7/ 30/2014 noted.No further bleeding.She has a history of very large internal and external hemorrhoids which gastroenterology felt was the most likely explanation for her bleeding.In addition she received one dose of Vit K due to coagulopathy secondary to cirrhosis with elevated INR with no more bleeding.  S/p paracentesis on 7/30 yielding 5.1 liters of turbid yellow fluid. Patient is being discharged today      MEDICATIONS:  . docusate sodium  100 mg Oral Daily  . feeding supplement  237 mL Oral BID BM  . FLUoxetine  20 mg Oral Q1200  . furosemide  20 mg Intravenous Once  . hydrocortisone  25 mg Rectal BID  . hydrocortisone-pramoxine   Rectal TID  . levothyroxine  75 mcg Oral Q0600  . pantoprazole  40 mg Oral Daily  . polyethylene glycol  17 g Oral TID  . sodium chloride  3 mL Intravenous Q12H  . sucralfate  1 g Oral TID AC & HS   diclofenac sodium, HYDROcodone-acetaminophen, HYDROmorphone, LORazepam, polyethylene glycol, sodium chloride, temazepam, zolpidem  ALLERGIES:   Allergies  Allergen Reactions  . Cephalexin Nausea Only  . Contrast Media (Iodinated Diagnostic Agents) Rash    GIVE BENADRYL PRIOR  . Other Nausea And Vomiting and  Rash    "MYCIN" DRUGS  . Sulfa Antibiotics Nausea And Vomiting and Rash     PHYSICAL EXAMINATION:   Filed Vitals:   10/21/12 0443  BP: 130/76  Pulse: 114  Temp: 97.5 F (36.4 C)  Resp: 18   Filed Weights   10/19/12 1635  Weight: 158 lb 11.7 oz (29 kg)    72 year old  in no acute distress A. and O. x3 General well-developed and well-nourished  HEENT: Normocephalic, atraumatic, PERRLA. Oral cavity without thrush or lesions. Neck supple. no  thyromegaly, no cervical or supraclavicular adenopathy  Lungs clear bilaterally . No wheezing, rhonchi or rales. Cardiac: regular rate and rhythm,no murmur , rubs or gallops Abdomen soft nontender , bowel sounds x4. No HSM Extremities no clubbing cyanosis or edema. No bruising or petechial rash Neuro: non focal  LABORATORY/RADIOLOGY DATA:   Recent Labs Lab 10/18/12 1616 10/19/12 0725 10/20/12 0805 10/21/12 0416  WBC 16.3* 12.0* 11.7* 10.4  HGB 6.3* 11.8* 12.8 11.7*  HCT 19.2* 34.9* 37.9 34.7*  PLT 173 120* 129* 115*  MCV 108.5* 95.4 94.8 96.4  MCH 35.6* 32.2 32.0 32.5  MCHC 32.8 33.8 33.8 33.7  RDW 20.2* 23.8* 24.4* 24.3*  LYMPHSABS 3.9  --   --   --   MONOABS 1.9*  --   --   --   EOSABS 0.1  --   --   --   BASOSABS 0.0  --   --   --     CMP    Recent Labs Lab 10/18/12 1616 10/19/12 0725 10/20/12 0525 10/21/12 0416  NA 129* 133* 132* 131*  K 5.2* 4.1 4.0 3.7  CL 98 99 100 100  CO2 23 24 23 21   GLUCOSE 129* 88 148* 154*  BUN 31* 29* 30* 27*  CREATININE 1.56* 1.40* 1.37* 1.10  CALCIUM 10.3 9.9 9.5 9.6  AST 78* 80*  --   --   ALT 16 16  --   --   ALKPHOS 128* 127*  --   --   BILITOT 3.2* 4.2*  --   --         Component Value Date/Time   BILITOT 4.2* 10/19/2012 0725   BILITOT 3.35* 10/13/2012 1312   BILIDIR 0.2 03/31/2012 1059    Anemia panel:  No results found for this basename: VITAMINB12, FOLATE, FERRITIN, TIBC, IRON, RETICCTPCT,  in the last 72 hours  No results found for this basename: TSH, T4TOTAL, FREET3, T3FREE, THYROIDAB,  in the last 72 hours   No results found for this basename: esrsedrate     Recent Labs Lab 10/18/12 1616 10/19/12 0725  INR 2.00* 1.97*      Urinalysis    Component Value Date/Time   COLORURINE AMBER* 10/18/2012 1809   APPEARANCEUR TURBID* 10/18/2012 1809   LABSPEC 1.022 10/18/2012 1809   LABSPEC 1.015 11/06/2011 1013   PHURINE 5.0 10/18/2012 1809   GLUCOSEU NEGATIVE 10/18/2012 1809   HGBUR LARGE* 10/18/2012 1809    BILIRUBINUR SMALL* 10/18/2012 1809   KETONESUR NEGATIVE 10/18/2012 1809   PROTEINUR 100* 10/18/2012 1809   UROBILINOGEN 1.0 10/18/2012 1809   NITRITE NEGATIVE 10/18/2012 1809   LEUKOCYTESUR LARGE* 10/18/2012 1809    Drugs of Abuse  No results found for this basename: labopia, cocainscrnur, labbenz, amphetmu, thcu, labbarb     Liver Function Tests:  Recent Labs Lab 10/18/12 1616 10/19/12 0725  AST 78* 80*  ALT 16 16  ALKPHOS 128* 127*  BILITOT 3.2* 4.2*  PROT 5.0* 4.8*  ALBUMIN  2.4* 2.2*    Recent Labs Lab 10/18/12 1616  LIPASE 32   No results found for this basename: AMMONIA,  in the last 168 hours  CBG:  Recent Labs Lab 10/19/12 0817 10/20/12 0734 10/21/12 0655  GLUCAP 83 136* 149*   Hgb A1c No results found for this basename: HGBA1C,  in the last 72 hours  Cardiac Enzymes:  Recent Labs Lab 10/18/12 1616  TROPONINI <0.30     Radiology Studies:      ASSESSMENT AND PLAN:    72 year old female with stage IV invasive ductal carcinoma, currently started  on Faslodex by Dr. Darrold Span on 09/14/12.  Plan to continue therarpy once discharged as scheduled.   2. Ascites. S/p paracentesis, recurrent.last, on 10/20/2012 To continue to proceed with paracentesis prn. Patient is not interested at this time for permanent catheter.   3. DNR   4. Coagulopathy secondary to cirrhosis and recent chemo. No bleeding issues at the time of discharge.   All other medical issues as per IM team.       Marcos Eke, PA-C 10/21/2012, 8:55 AM  ATTENDING'S ATTESTATION:  I personally reviewed patient's chart, examined patient myself, formulated the treatment plan as followed.    #1Patient and I discussed the possibility of having a catheter placed for chronic paracentesis.  #2 she will continue her Faslodex injection as an outpatient  Drue Second, MD Medical/Oncology [facility] [phone] (beeper) [phone] (Office)

## 2012-10-21 NOTE — Discharge Summary (Signed)
Physician Discharge Summary  Kelli [name] WUJ:[id number] DOB: 1941/03/03 DOA: 10/18/2012  PCP: [name], MD  Admit date: 10/18/2012 Discharge date: 10/22/2012  Recommendations for Outpatient Follow-up:  1. Follow up with oncology next week for possible repeat CT scan for staging. She will need to have a repeat BMP to ensure that her creatinine is at baseline prior to obtaining her CT.  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 She was treated with lumpectomy and 18 node axillary dissection, adjuvant adriamycin/cytoxan followed by CMF chemotherapy, local radiation (please see full details from Ms. Mardelle Matte last OV)  Husband braought her to ED 7/28 with Rectal bleeding that began 7/25, about 3-4 episodes per patinet. It seemed ot worsen 7/29-was BRB per rectum, no mixture of this and stool-no known hemorrhoidal disease Has been feeling weak as well and has had a hard time gettign a stool out for the last 4-5 days and is constipated for this-she has trialed miralx and colace. Has not really been up and about therefore cannot really tell me if she is dizzy.  She had a Doc appt on Wednesday 7/23 and was fine- No fever, but has had  some diarrhea-this has been on and off-and seems to alternate with normal stool vs consitpation.  She has had 2 paracentesis x 2 already-this seems to be part of the progression of her Metastatic disease  Patient has never been told that. She has hemorrhoids, or has had any issues with bleeding before-she has had a negative colonoscopy about 3-4 years go by [facility] gastroenterology-details of this are unclear at present.   Hospital Course:   Acute blood loss anemia: The patient had a baseline hemoglobin of approximately 9 and it decreased to 6.3 mg/dL during admission. She had recurrent episodes of rectal bleeding which slowly tapered. She was transfused 3 units of packed red blood cells and her hemoglobin increased to approximately 12 and has remained stable for the last several days. She was seen by gastroenterology who recommended against any further procedures at this time and counseled her to use stool softeners to prevent hemorrhoids.  She was also prescribed Anusol. She has a history of very large internal and external hemorrhoids which gastroenterology felt was the most likely explanation for her bleeding.  Coagulopathy, mildly elevated INR and elevated LFTs with ascites suggestive of underlying cirrhosis. This may be secondary to her history of metastatic cancer to the liver. She was given a dose of vitamin K. She had no evidence of further bleeding prior to discharge.  Acute kidney injury, baseline creatinine of 0.9. Her initial creatinine was 1.56 and it trended down to 1.05 prior to discharge. This is most likely due to dehydration secondary to  her acute bleeding and resolved with IV fluids.  Leukocytosis, may have been reactive from acute GI bleed in trended down.  Breast cancer with metastases to multiple sites including the liver. She was followed by oncology who recommended that she have a CAT scan done for restaging after kidney function has returned to normal. Would recommend waiting  for at least one more week prior to obtaining the CT scan to decrease her risk of recurrent acute kidney injury.  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 She discussed the possibility of a palliative catheter to prevent her from needing recurrent paracentesis, however after discussion of the risks and benefits of recurrent paracentesis versus catheter placement she is elected to continue when necessary paracentesis for now.  Malignant hydronephrosis, followup with Doctor Dahlstedt from Urology.  Hypothyroidism, stable. She continued Synthroid daily.  Moderate protein calorie malnutrition. She was started on supplements and given a regular diet.  Enterobacter UTI, started on ciprofloxacin to complete a 7 day course.  Fall and deconditioning.  Seen by PT/OT who recommended home with home health services which were arranged.    Consultants:  Oncology  Gastroenterology Procedures:  Paracentesis Antibiotics:  None   Discharge Exam: Filed Vitals:   10/22/12 1300  BP: 130/69  Pulse: 128  Temp: 97.6 F (36.4 C)  Resp: 16   Filed Vitals:   10/21/12 2139 10/22/12 0349 10/22/12 0545 10/22/12 1300  BP: 145/76 123/62 118/65 130/69  Pulse: 118 110 104 128  Temp: 97.8 F (36.6 C) 98.1 F (36.7 C) 98.5 F (36.9 C) 97.6 F (36.4 C)  TempSrc: Oral Oral Oral Oral  Resp: 18 20 18 16   Height:      Weight:      SpO2: 97% 96% 96% 99%   Had 3 episodes of diarrhea attributed to frequent miralax. Abdomen feeling softer now. She had stool incontinence x 1 and a fall in the bathroom overnight. She feels weak.    General: Cachectic Caucasian female, Alert, awake, oriented x3, in no acute distress, lying in bed  HEENT: No bruits, no goiter.  Heart: Regular rate and rhythm, without murmurs, rubs, gallops.  Lungs: Good air movement, clear to auscultation  Abdomen: Soft, nontender, mildly distended, positive bowel sounds.  Neuro:  Grossly intact, nonfocal.  MSK: 1+ pitting edema of bilateral lower extremities   Discharge Instructions      Discharge Orders   Future Appointments Provider Department Dept Phone   10/28/2012 1:45 PM Mauri [facility] MEDICAL ONCOLOGY [phone]   10/28/2012 2:15 PM Augustin Schooling, NP [facility] [phone]   10/28/2012 3:15 PM [facility] [phone]   10/28/2012 3:45 PM [facility] Flush Nurse [facility] [phone]   11/01/2012 3:30 PM [facility] [phone]   Patient to arrive 15 minutes prior to appointment time.   Future Orders Complete By Expires     Call MD for:  difficulty breathing, headache or visual disturbances  As directed     Call MD for:  extreme fatigue  As directed     Call MD for:  hives  As directed     Call MD for:  persistant dizziness or light-headedness  As directed     Call MD for:  persistant nausea and vomiting  As directed     Call MD for:  redness, tenderness, or signs of  infection (pain, swelling, redness, odor or green/yellow discharge around incision site)  As directed     Call MD for:  severe uncontrolled pain  As directed     Call MD for:  temperature >100.4  As directed     Diet general  As directed     Discharge instructions  As directed     Comments:      You were hospitalized with rectal bleeding from hemorrhoids.  Please take enough stool softeners to have 1-2 soft bowel movements per day.  You underwent paracentesis and please continue the conversation about possible catheter placement with your doctor, especially if you require more frequent paracenteses.  You had some mild kidney injury which recovered and you should be able to have your CT scan next week.  To help with your leg swelling, please eat plenty of protein and use protein shakes, wear TED hose, and keep your legs  elevated.  YOu had a urinary tract infection and will need to complete 7 days of antibiotics.  We will arrange for home health services to improve your strength and mobility.  You already have follow up appointments scheduled so you just need to make sure you keep them.    Increase activity slowly  As directed         Medication List         ciprofloxacin 500 MG tablet  Commonly known as:  CIPRO  Take 1 tablet (500 mg total) by mouth 2 (two) times daily.     diclofenac sodium 1 % Gel  Commonly known as:  VOLTAREN  Apply 2 g topically 2 (two) times daily as needed (for pain). Apply to knee     docusate sodium 100 MG capsule  Commonly known as:  COLACE  Take 100 mg by mouth daily.     feeding supplement Liqd  Take 237 mLs by mouth 2 (two) times daily between meals.     FLUoxetine 20 MG capsule  Commonly known as:  PROZAC  Take 20 mg by mouth daily at 12 noon.     fulvestrant 250 MG/5ML injection  Commonly known as:  FASLODEX  Inject 250 mg into the muscle every 30 (thirty) days. One injection each buttock over 1-2 minutes. Warm prior to use.     furosemide 20 MG tablet  Commonly known as:  LASIX  Take 1 tablet (20 mg total) by mouth daily.     GAS-X PO  Take 1 tablet by mouth daily as needed (for gas).     HYDROcodone-acetaminophen 5-325 MG per tablet  Commonly known as:  NORCO/VICODIN  Take 1 tablet by mouth every 6 (six) hours as needed for pain.     hydrocortisone 25 MG suppository  Commonly known as:  ANUSOL-HC  Place 1 suppository (25 mg total) rectally 2 (two) times daily.     hydrocortisone-pramoxine 2.5-1 % rectal cream  Commonly known as:  ANALPRAM-HC  Place rectally 3 (three) times daily.     HYDROmorphone 2 MG tablet  Commonly known as:  DILAUDID  Take 1 tablet (2 mg total) by mouth every 4 (four) hours as needed for pain.     levothyroxine 75 MCG tablet  Commonly known as:  SYNTHROID, LEVOTHROID  Take 75 mcg by mouth every morning.      lidocaine-prilocaine cream  Commonly known as:  EMLA  Apply topically as needed. Apply to PortaCath site as directed 2 hours prior to access.     LORazepam 1 MG tablet  Commonly known as:  ATIVAN  Take 1 tablet (1 mg total) by mouth every 6 (six) hours as needed (for nausea or to relax).     MIRALAX powder  Generic drug:  polyethylene glycol powder  Take 17 g by mouth as needed.     ondansetron 8 MG disintegrating tablet  Commonly known as:  ZOFRAN-ODT  Take 8 mg by mouth every 12 (twelve) hours as needed for nausea.     pantoprazole 40 MG tablet  Commonly known as:  PROTONIX     sodium chloride 0.65 % nasal spray  Commonly known as:  OCEAN  Place 1 spray into the nose as needed (to keep nasal passages moist). Use every 1-2 hours while awake to keep nasal passages moist.     sucralfate 1 G tablet  Commonly known as:  CARAFATE  Take 1 tablet (1 g total) by mouth 4 (four) times daily -  before meals and at bedtime. Can dissolve in water, for stomach     temazepam 15 MG capsule  Commonly known as:  RESTORIL  Take 1 capsule (15 mg total) by mouth at bedtime as needed for sleep.     zolendronic acid 4 MG/5ML injection  Commonly known as:  ZOMETA  Inject 4 mg into the vein once.     zolpidem 5 MG tablet  Commonly known as:  AMBIEN  Take 1 tablet (5 mg total) by mouth at bedtime as needed for sleep.          The results of significant diagnostics from this hospitalization (including imaging, microbiology, ancillary and laboratory) are listed below for reference.    Significant Diagnostic Studies: US Paracentesis  10/20/2012   *RADIOLOGY REPORT*  Clinical Data: Ascites  ULTRASOUND GUIDED PARACENTESIS  An ultrasound guided paracentesis was thoroughly discussed with the patient and questions answered.  The benefits, risks, alternatives and complications were also discussed.  The patient understands and wishes to proceed with the procedure.  Written consent was obtained.   Ultrasound was performed to localize and mark an adequate pocket of fluid in the left lower quadrant of the abdomen.  The area was then prepped and draped in the normal sterile fashion.  1% Lidocaine was used for local anesthesia.  Under ultrasound guidance a 19 gauge Yueh catheter was introduced.  Paracentesis was performed.  The catheter was removed and a dressing applied.  Complications:  none  Findings:  A total of approximately 5.1 liters of turbid yellow fluid was removed.  A fluid sample was not sent for laboratory analysis.  IMPRESSION: Successful ultrasound guided paracentesis yielding 5.1 liters of ascites.  Read By:  Pattricia Boss PA-C   Original Report Authenticated By: Jolaine Click, M.D.   US Paracentesis  10/07/2012   *RADIOLOGY REPORT*  Clinical Data: Metastatic breast cancer, recurrent ascites. Request is made for therapeutic paracentesis.  ULTRASOUND GUIDED  THERAPEUTIC PARACENTESIS  An ultrasound guided paracentesis was thoroughly discussed with the patient and questions answered.  The benefits, risks, alternatives and complications were also discussed.  The patient understands and wishes to proceed with the procedure.  Written consent was obtained.  Ultrasound was performed to localize and mark an adequate pocket of fluid in the left lower quadrant of the abdomen.  The area was then prepped and draped in the normal sterile fashion.  1% Lidocaine was used for local anesthesia.  Under ultrasound guidance a 19 gauge Yueh catheter was introduced.  Paracentesis was performed.  The catheter was removed and a dressing applied.  Complications:  none  Findings:  A total of approximately 3.5 liters of slightly turbid, yellow fluid was removed.  IMPRESSION: Successful ultrasound guided therapeutic paracentesis yielding 3.5 liters of ascites.  Read by: Jeananne Rama, P.A.-C.   Original Report Authenticated By: Tacey Ruiz, MD   US Paracentesis  09/28/2012   *RADIOLOGY REPORT*  Clinical Data: Metastatic  breast cancer, abdominal distension, request for therapeutic paracentesis  ULTRASOUND GUIDED PARACENTESIS  Comparison:  None  An ultrasound guided paracentesis was thoroughly discussed with the patient and questions answered.  The benefits, risks, alternatives and complications were also discussed.  The patient understands and wishes to proceed with the procedure.  Written consent was obtained.  Ultrasound was performed to localize and mark an adequate pocket of fluid in the left lower quadrant of the abdomen.  The area was then prepped and draped in the normal sterile fashion.  1% Lidocaine was used for local anesthesia.  Under ultrasound guidance a 19 gauge Yueh catheter was introduced.  Paracentesis was performed.  The catheter was removed and a dressing applied.  Complications:  None  Findings:  A total of approximately 2.3 liters of hazy yellow fluid was removed.  A fluid sample was not sent for laboratory analysis.  IMPRESSION: Successful ultrasound guided paracentesis yielding 2.3 liters of ascites.  Read by Brayton El PA-C   Original Report Authenticated By: Richarda Overlie, M.D.   Dg Chest Port 1 View  10/18/2012   *RADIOLOGY REPORT*  Clinical Data: Weakness, rectal bleeding  PORTABLE CHEST - 1 VIEW  Comparison: 09/01/2012  Findings: Cardiomediastinal silhouette is stable.  Study is limited by poor inspiration.  Surgical clips in left axilla again noted. No acute infiltrate or pulmonary edema.  Old fracture of the right seventh rib. There is a right IJ Port-A-Cath with tip in SVC right atrium junction.  No diagnostic pneumothorax.  IMPRESSION:  Study is limited by poor inspiration.  Surgical clips in left axilla again noted. No acute infiltrate or pulmonary edema.  Old fracture of the right seventh rib. There is a right IJ Port-A-Cath with tip in SVC right atrium junction.  No diagnostic pneumothorax.   Original Report Authenticated By: Natasha Mead, M.D.   Dg Abd 2 Views  09/27/2012   *RADIOLOGY REPORT*   Clinical Data: Constipation and abdominal distention  ABDOMEN - 2 VIEW  Comparison: Ultrasound 09/21/2012  Findings: There is a left-sided Nephro ureteral stent in place.  No renal calculi identified.  The bowel gas pattern appears normal. No dilated loops of small bowel or air-fluid levels identified. Small pleural effusions are noted.  IMPRESSION:  1.  Nonobstructive bowel gas pattern. 2.  Left sided ureteral stent.   Original Report Authenticated By: Signa Kell, M.D.    Microbiology: Recent Results (from the past 240 hour(s))  URINE CULTURE     Status: None   Collection Time    10/18/12  6:09 PM      Result Value Range Status   Specimen Description URINE, CLEAN CATCH   Final   Special Requests NONE   Final   Culture  Setup Time 10/18/2012 22:25   Final   Colony Count >=100,000 COLONIES/ML   Final   Culture ENTEROBACTER CLOACAE   Final   Report Status 10/20/2012 FINAL   Final   Organism ID, Bacteria ENTEROBACTER CLOACAE   Final  MRSA PCR SCREENING     Status: None   Collection Time    10/18/12  7:10 PM      Result Value  Range Status   MRSA by PCR NEGATIVE  NEGATIVE Final   Comment:            The GeneXpert MRSA Assay (FDA     approved for NASAL specimens     only), is one component of a     comprehensive MRSA colonization     surveillance program. It is not     intended to diagnose MRSA     infection nor to guide or     monitor treatment for     MRSA infections.     Labs: Basic Metabolic Panel:  Recent Labs Lab 10/18/12 1616 10/19/12 0725 10/20/12 0525 10/21/12 0416 10/22/12 0330  NA 129* 133* 132* 131* 131*  K 5.2* 4.1 4.0 3.7 4.0  CL 98 99 100 100 99  CO2 23 24 23 21 21   GLUCOSE 129* 88 148* 154* 127*  BUN 31* 29* 30* 27* 25*  CREATININE 1.56* 1.40* 1.37* 1.10 1.05  CALCIUM 10.3 9.9 9.5 9.6 10.3   Liver Function Tests:  Recent Labs Lab 10/18/12 1616 10/19/12 0725  AST 78* 80*  ALT 16 16  ALKPHOS 128* 127*  BILITOT 3.2* 4.2*  PROT 5.0* 4.8*   ALBUMIN 2.4* 2.2*    Recent Labs Lab 10/18/12 1616  LIPASE 32   No results found for this basename: AMMONIA,  in the last 168 hours CBC:  Recent Labs Lab 10/18/12 1616 10/19/12 0725 10/20/12 0805 10/21/12 0416 10/22/12 0330  WBC 16.3* 12.0* 11.7* 10.4 12.7*  NEUTROABS 10.5*  --   --   --   --   HGB 6.3* 11.8* 12.8 11.7* 12.9  HCT 19.2* 34.9* 37.9 34.7* 38.1  MCV 108.5* 95.4 94.8 96.4 96.0  PLT 173 120* 129* 115* 124*   Cardiac Enzymes:  Recent Labs Lab 10/18/12 1616  TROPONINI <0.30   BNP: BNP (last 3 results) No results found for this basename: PROBNP,  in the last 8760 hours CBG:  Recent Labs Lab 10/19/12 0817 10/20/12 0734 10/21/12 0655 10/22/12 0728  GLUCAP 83 136* 149* 138*    Time coordinating discharge: 45 minutes  Signed:  Nazaire Cordial  [facility] 10/22/2012, 2:38 PM

## 2012-10-22 ENCOUNTER — Encounter (HOSPITAL_COMMUNITY): Payer: Self-pay | Admitting: *Deleted

## 2012-10-22 DIAGNOSIS — R5381 Other malaise: Secondary | ICD-10-CM

## 2012-10-22 DIAGNOSIS — K922 Gastrointestinal hemorrhage, unspecified: Secondary | ICD-10-CM

## 2012-10-22 DIAGNOSIS — C800 Disseminated malignant neoplasm, unspecified: Secondary | ICD-10-CM

## 2012-10-22 DIAGNOSIS — C50919 Malignant neoplasm of unspecified site of unspecified female breast: Secondary | ICD-10-CM

## 2012-10-22 LAB — BASIC METABOLIC PANEL
CO2: 21 mEq/L (ref 19–32)
Chloride: 99 mEq/L (ref 96–112)
Potassium: 4 mEq/L (ref 3.5–5.1)
Sodium: 131 mEq/L — ABNORMAL LOW (ref 135–145)

## 2012-10-22 LAB — CBC
HCT: 38.1 % (ref 36.0–46.0)
MCV: 96 fL (ref 78.0–100.0)
RBC: 3.97 MIL/uL (ref 3.87–5.11)
WBC: 12.7 10*3/uL — ABNORMAL HIGH (ref 4.0–10.5)

## 2012-10-22 MED ORDER — HEPARIN SOD (PORK) LOCK FLUSH 100 UNIT/ML IV SOLN
500.0000 [IU] | INTRAVENOUS | Status: AC | PRN
  Administered 2012-10-22: 500 [IU]

## 2012-10-22 MED ORDER — CIPROFLOXACIN HCL 500 MG PO TABS: 500.0000 mg | ORAL_TABLET | Freq: Two times a day (BID) | ORAL | Status: DC

## 2012-10-22 MED ORDER — POLYETHYLENE GLYCOL 3350 17 G PO PACK
17.0000 g | PACK | Freq: Every day | ORAL | Status: DC | PRN
  Filled 2012-10-22: qty 1

## 2012-10-22 MED ORDER — ENSURE COMPLETE PO LIQD: 237.0000 mL | Freq: Two times a day (BID) | ORAL | Status: AC

## 2012-10-22 MED ORDER — HYDROCORTISONE ACETATE 25 MG RE SUPP: 25.0000 mg | Freq: Two times a day (BID) | RECTAL | Status: AC

## 2012-10-22 MED ORDER — HYDROCORTISONE ACE-PRAMOXINE 2.5-1 % RE CREA: TOPICAL_CREAM | Freq: Three times a day (TID) | RECTAL | Status: AC

## 2012-10-22 NOTE — Evaluation (Signed)
Physical Therapy Evaluation Patient Details Name: Kelli [name] MRN: [id number] DOB: 1940/08/01 Today's Date: 10/22/2012 Time: 4098-1191 PT Time Calculation (min): 18 min  PT Assessment / Plan / Recommendation History of Present Illness  72 y.o. 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 It seemed to worsen 7/29-was BRB per rectum, GI consulted,m s/p 3 units 7.29.2014. Hx of fx femur with ORIF 13 months ago   Clinical Impression  Pt presents with deconditioning. Expect she will improve with continued PT with HHPT and intermittent exercises on her own.    PT Assessment  Patient needs continued PT services;All further PT needs can be met in the next venue of care    Follow Up Recommendations  Home health PT    Does the patient have the potential to tolerate intense rehabilitation      Barriers to Discharge        Equipment Recommendations  None recommended by PT    Recommendations for Other Services     Frequency Min 3X/week    Precautions / Restrictions Precautions Precautions: Fall Restrictions Weight Bearing Restrictions: No   Pertinent Vitals/Pain No c/o pain      Mobility  Bed Mobility Details for Bed Mobility Assistance: pt up in room Transfers Transfers: Sit to Stand;Stand to Sit Sit to Stand: 6: Modified independent (Device/Increase time);With upper extremity assist Stand to Sit: 6: Modified independent (Device/Increase time);With upper extremity assist Details for Transfer Assistance: extra time Ambulation/Gait Ambulation/Gait Assistance: 5: Supervision Ambulation Distance (Feet): 75 Feet Assistive device: Rolling walker Ambulation/Gait Assistance Details: safety Gait Pattern: Step-through pattern;Decreased step length - right;Decreased step length - left Gait velocity: decreased General Gait Details: Pt appears weak,  needs to use both hands on RW for balance. she appears to fatige at end of walk with some unsteadiness, but is able to self correct with RW Stairs: Yes (verbally reviewed with husband and patient) Stairs Assistance: 4: Min Editor, commissioning Details (indicate cue type and reason): Husband describes going up with one rail and his arm, or up the steps with RW backward.  They have used this technique since pt had ORIF last year Number of Stairs: 2 Wheelchair Mobility Wheelchair Mobility: No    Exercises Other Exercises Other Exercises: repeated sit to stand x 3 reps with isometrics in standing Other Exercises: static standing x 60 sec   PT Diagnosis: Generalized weakness;Abnormality of gait;Difficulty walking  PT Problem List: Decreased strength;Decreased activity tolerance;Decreased balance;Decreased safety awareness PT Treatment Interventions: DME instruction;Gait training;Stair training;Functional mobility training;Therapeutic activities;Therapeutic exercise;Patient/family education     PT Goals(Current goals can be found in the care plan section) Acute Rehab PT Goals Patient Stated Goal: to go home today PT Goal Formulation: With patient Time For Goal Achievement: 11/05/12 Potential to Achieve Goals: Good  Visit Information  Last PT Received On: 10/22/12 Assistance Needed: +1 History of Present Illness: 72 y.o. 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 It seemed to worsen 7/29-was BRB per rectum, GI consulted,m s/p 3 units 7.29.2014. Hx of fx femur with ORIF 13 months ago        Prior Functioning  Home Living Family/patient expects to be discharged to:: Private residence Living Arrangements: Spouse/significant other Available Help at Discharge: Family;Available 24 hours/day Type of Home:  House Home Access: Stairs to enter [employer] of Steps: 2 Entrance Stairs-Rails: None Home Layout: Two level;Able to live on main level with bedroom/bathroom Home Equipment: Dan Humphreys - 2 wheels;Cane - single point Prior Function Level of Independence: Independent with assistive device(s) Comments: pt used a cane Communication Communication: No difficulties    Cognition  Cognition Arousal/Alertness: Awake/alert Behavior During Therapy: WFL for tasks assessed/performed Overall Cognitive Status: Within Functional Limits for tasks assessed Memory: Decreased short-term memory    Extremity/Trunk Assessment Lower Extremity Assessment Lower Extremity Assessment: Generalized weakness (muscle atrophy evident) Cervical / Trunk Assessment Cervical / Trunk Assessment: Other exceptions Cervical / Trunk Exceptions: pt with malignant ascites   Balance Balance Balance Assessed: Yes Static Sitting Balance Static Sitting - Balance Support: No upper extremity supported;Feet supported Static Sitting - Level of Assistance: 7: Independent Static Standing Balance Static Standing - Balance Support: Bilateral upper extremity supported;During functional activity Static Standing - Level of Assistance: 5: Stand by assistance  End of Session PT - End of Session Activity Tolerance: Patient tolerated treatment well Patient left: with family/visitor present  GP    [name], [place] [phone] 10/22/2012, 2:14 PM

## 2012-10-22 NOTE — Progress Notes (Addendum)
[facility] PROGRESS NOTE Interim History: 72 y.o. 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 It seemed to worsen 7/29-was BRB per rectum, GI consulted,m s/p 3 units 7.29.2014.    Assessment/Plan: Acute blood loss anemia: The patient had a baseline hemoglobin of approximately 9 and it decreased to 6.3 mg/dL during admission. She had recurrent episodes of rectal bleeding which slowly tapered. She was transfused 3 units of packed red blood cells and her hemoglobin increased to approximately 12 and has remained stable for the last several days. She was seen by gastroenterology who recommended against any further procedures at this time and counseled her to use stool softeners to prevent hemorrhoids. She was also prescribed Anusol. She has a history of very large internal and external hemorrhoids which gastroenterology felt was the most likely explanation for her bleeding.   Coagulopathy, mildly elevated INR and elevated LFTs with ascites suggestive of underlying cirrhosis. This may be secondary to her history of metastatic cancer to the liver. She was given a dose of vitamin K. She had no evidence of further bleeding prior to discharge.   Acute kidney injury, baseline creatinine of 0.9. Her initial creatinine was 1.56 and it trended down to 1.10 prior to discharge. This is most likely due to dehydration secondary to her acute bleeding and resolved with IV fluids.  Leukocytosis, may have been reactive from acute GI bleed in trended down.   Breast cancer with metastases to multiple sites including the liver. She was followed by oncology who recommended that she have a CAT scan done for restaging after kidney function has returned to normal. Would recommend waiting for at least one more week prior to obtaining the CT scan to minimize her risk of recurrent acute kidney  injury.   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 She discussed the possibility of a palliative catheter to prevent her from needing recurrent paracentesis, however after discussion of the risks and benefits of recurrent paracentesis versus catheter placement she is elected to continue when necessary paracentesis for now.   Malignant hydronephrosis, followup with Doctor Dahlstedt from Urology.   Hypothyroidism, stable. She continued Synthroid daily.   Moderate protein calorie malnutrition. She was started on supplements and given a regular diet.  Generalized weakness -  PT/OT evaluation  Enterobacter UTI -  Day 2 ciprofloxacin  Code Status: DNR  Family Communication: spoke with patient and husband at bedside  Disposition Plan:  PT evaluation, then discharge to appropriate level of care, Home vs. Rehab Vs. [facility] term rehab at SNF?    Consultants:  Oncology  Gastroenterology  Procedures:  Paracentesis  Antibiotics:  None  HPI/Subjective:  Had 3 episodes of diarrhea attributed to frequent miralax.  Abdomen feeling softer now.  She had stool incontinence x 1 and a fall in the bathroom overnight.  She feels weak.   Objective: Filed Vitals:   10/21/12 1415 10/21/12 2139 10/22/12 0349 10/22/12 0545  BP: 120/63 145/76 123/62 118/65  Pulse: 106 118 110 104  Temp: 97.9 F (36.6 C) 97.8 F (36.6 C) 98.1 F (36.7 C) 98.5 F (36.9 C)  TempSrc: Oral Oral Oral Oral  Resp: 18 18 20 18   Height:      Weight:      SpO2: 100% 97% 96% 96%    Intake/Output Summary (Last 24 hours) at 10/22/12 1332  Last data filed at 10/22/12 0831  Gross per 24 hour  Intake    720 ml  Output      0 ml  Net    720 ml   Filed Weights   10/19/12 1635  Weight: 72 kg (158 lb 11.7 oz)    Exam:  General: Cachectic Caucasian female, Alert, awake, oriented x3, in no acute distress, lying in bed HEENT: No bruits, no goiter.  Heart:  Regular rate and rhythm, without murmurs, rubs, gallops.  Lungs: Good air movement, clear to auscultation  Abdomen: Soft, nontender, mildly distended, positive bowel sounds.  Neuro: Grossly intact, nonfocal. MSK: 1+ pitting edema of bilateral lower extremities   Data Reviewed: Basic Metabolic Panel:  Recent Labs Lab 10/18/12 1616 10/19/12 0725 10/20/12 0525 10/21/12 0416 10/22/12 0330  NA 129* 133* 132* 131* 131*  K 5.2* 4.1 4.0 3.7 4.0  CL 98 99 100 100 99  CO2 23 24 23 21 21   GLUCOSE 129* 88 148* 154* 127*  BUN 31* 29* 30* 27* 25*  CREATININE 1.56* 1.40* 1.37* 1.10 1.05  CALCIUM 10.3 9.9 9.5 9.6 10.3   Liver Function Tests:  Recent Labs Lab 10/18/12 1616 10/19/12 0725  AST 78* 80*  ALT 16 16  ALKPHOS 128* 127*  BILITOT 3.2* 4.2*  PROT 5.0* 4.8*  ALBUMIN 2.4* 2.2*    Recent Labs Lab 10/18/12 1616  LIPASE 32   No results found for this basename: AMMONIA,  in the last 168 hours CBC:  Recent Labs Lab 10/18/12 1616 10/19/12 0725 10/20/12 0805 10/21/12 0416 10/22/12 0330  WBC 16.3* 12.0* 11.7* 10.4 12.7*  NEUTROABS 10.5*  --   --   --   --   HGB 6.3* 11.8* 12.8 11.7* 12.9  HCT 19.2* 34.9* 37.9 34.7* 38.1  MCV 108.5* 95.4 94.8 96.4 96.0  PLT 173 120* 129* 115* 124*   Cardiac Enzymes:  Recent Labs Lab 10/18/12 1616  TROPONINI <0.30   BNP (last 3 results) No results found for this basename: PROBNP,  in the last 8760 hours CBG:  Recent Labs Lab 10/19/12 0817 10/20/12 0734 10/21/12 0655 10/22/12 0728  GLUCAP 83 136* 149* 138*    Recent Results (from the past 240 hour(s))  URINE CULTURE     Status: None   Collection Time    10/18/12  6:09 PM      Result Value Range Status   Specimen Description URINE, CLEAN CATCH   Final   Special Requests NONE   Final   Culture  Setup Time 10/18/2012 22:25   Final   Colony Count >=100,000 COLONIES/ML   Final   Culture ENTEROBACTER CLOACAE   Final   Report Status 10/20/2012 FINAL   Final   Organism  ID, Bacteria ENTEROBACTER CLOACAE   Final  MRSA PCR SCREENING     Status: None   Collection Time    10/18/12  7:10 PM      Result Value Range Status   MRSA by PCR NEGATIVE  NEGATIVE Final   Comment:            The GeneXpert MRSA Assay (FDA     approved for NASAL specimens     only), is one component of a     comprehensive MRSA colonization     surveillance program. It is not     intended to diagnose MRSA     infection nor to guide or     monitor treatment for     MRSA infections.  Studies: No results found.  Scheduled Meds: . ciprofloxacin  500 mg Oral BID  . docusate sodium  100 mg Oral Daily  . feeding supplement  237 mL Oral BID BM  . FLUoxetine  20 mg Oral Q1200  . furosemide  20 mg Intravenous Once  . hydrocortisone  25 mg Rectal BID  . hydrocortisone-pramoxine   Rectal TID  . levothyroxine  75 mcg Oral Q0600  . pantoprazole  40 mg Oral Daily  . polyethylene glycol  17 g Oral TID  . sodium chloride  3 mL Intravenous Q12H  . sucralfate  1 g Oral TID AC & HS   Continuous Infusions:     Kelli [name], Kelli [name]  [facility] Pager [phone]. If 7PM-7AM, please contact night-coverage at [email or web address], password [facility] 10/22/2012, 1:32 PM  LOS: 4 days

## 2012-10-22 NOTE — Progress Notes (Signed)
Upon entering room to perform rounds pt found on floor in BR. Pt denied hitting her head. Pt denies any pain or distress. Pt w/o any obvious s/s of injury or broken bones. Pt able to walk back to bed. [facility]level paged awaiting call back. Press photographer and house supervisor informed. Will continue to monitor.

## 2012-10-22 NOTE — Progress Notes (Signed)
Patient discharge to home, husband at bedside, port deaccessed by IV Rn. D/c instructions and follow up appointments done and was given to the patient. Discharged no complaints of any pain or discomfort

## 2012-10-25 ENCOUNTER — Emergency Department (HOSPITAL_COMMUNITY): Payer: Medicare Other

## 2012-10-25 ENCOUNTER — Inpatient Hospital Stay (HOSPITAL_COMMUNITY)
Admission: EM | Admit: 2012-10-25 | Discharge: 2012-10-30 | DRG: 689 | Disposition: A | Discharge status: Hospice | Payer: Medicare Other | Attending: Internal Medicine | Admitting: Internal Medicine

## 2012-10-25 ENCOUNTER — Encounter (HOSPITAL_COMMUNITY): Payer: Self-pay | Admitting: *Deleted

## 2012-10-25 DIAGNOSIS — R296 Repeated falls: Secondary | ICD-10-CM

## 2012-10-25 DIAGNOSIS — Z515 Encounter for palliative care: Secondary | ICD-10-CM

## 2012-10-25 DIAGNOSIS — E440 Moderate protein-calorie malnutrition: Secondary | ICD-10-CM | POA: Diagnosis present

## 2012-10-25 DIAGNOSIS — C7951 Secondary malignant neoplasm of bone: Secondary | ICD-10-CM

## 2012-10-25 DIAGNOSIS — R5381 Other malaise: Secondary | ICD-10-CM

## 2012-10-25 DIAGNOSIS — D689 Coagulation defect, unspecified: Secondary | ICD-10-CM

## 2012-10-25 DIAGNOSIS — R627 Adult failure to thrive: Secondary | ICD-10-CM | POA: Diagnosis present

## 2012-10-25 DIAGNOSIS — E871 Hypo-osmolality and hyponatremia: Secondary | ICD-10-CM | POA: Diagnosis present

## 2012-10-25 DIAGNOSIS — K7290 Hepatic failure, unspecified without coma: Secondary | ICD-10-CM

## 2012-10-25 DIAGNOSIS — N390 Urinary tract infection, site not specified: Principal | ICD-10-CM

## 2012-10-25 DIAGNOSIS — R180 Malignant ascites: Secondary | ICD-10-CM

## 2012-10-25 DIAGNOSIS — E43 Unspecified severe protein-calorie malnutrition: Secondary | ICD-10-CM | POA: Diagnosis present

## 2012-10-25 DIAGNOSIS — Y92009 Unspecified place in unspecified non-institutional (private) residence as the place of occurrence of the external cause: Secondary | ICD-10-CM

## 2012-10-25 DIAGNOSIS — R0902 Hypoxemia: Secondary | ICD-10-CM

## 2012-10-25 DIAGNOSIS — E039 Hypothyroidism, unspecified: Secondary | ICD-10-CM

## 2012-10-25 DIAGNOSIS — K1230 Oral mucositis (ulcerative), unspecified: Secondary | ICD-10-CM

## 2012-10-25 DIAGNOSIS — Z66 Do not resuscitate: Secondary | ICD-10-CM | POA: Diagnosis present

## 2012-10-25 DIAGNOSIS — I959 Hypotension, unspecified: Secondary | ICD-10-CM | POA: Diagnosis present

## 2012-10-25 DIAGNOSIS — N1330 Unspecified hydronephrosis: Secondary | ICD-10-CM | POA: Diagnosis present

## 2012-10-25 DIAGNOSIS — K7682 Hepatic encephalopathy: Secondary | ICD-10-CM

## 2012-10-25 DIAGNOSIS — C801 Malignant (primary) neoplasm, unspecified: Secondary | ICD-10-CM

## 2012-10-25 DIAGNOSIS — R0600 Dyspnea, unspecified: Secondary | ICD-10-CM

## 2012-10-25 DIAGNOSIS — Z6827 Body mass index (BMI) 27.0-27.9, adult: Secondary | ICD-10-CM

## 2012-10-25 DIAGNOSIS — C50912 Malignant neoplasm of unspecified site of left female breast: Secondary | ICD-10-CM

## 2012-10-25 DIAGNOSIS — D62 Acute posthemorrhagic anemia: Secondary | ICD-10-CM

## 2012-10-25 DIAGNOSIS — R609 Edema, unspecified: Secondary | ICD-10-CM | POA: Diagnosis present

## 2012-10-25 DIAGNOSIS — F411 Generalized anxiety disorder: Secondary | ICD-10-CM | POA: Diagnosis present

## 2012-10-25 DIAGNOSIS — R531 Weakness: Secondary | ICD-10-CM

## 2012-10-25 DIAGNOSIS — C787 Secondary malignant neoplasm of liver and intrahepatic bile duct: Secondary | ICD-10-CM

## 2012-10-25 DIAGNOSIS — F329 Major depressive disorder, single episode, unspecified: Secondary | ICD-10-CM | POA: Diagnosis present

## 2012-10-25 DIAGNOSIS — D72829 Elevated white blood cell count, unspecified: Secondary | ICD-10-CM

## 2012-10-25 DIAGNOSIS — K922 Gastrointestinal hemorrhage, unspecified: Secondary | ICD-10-CM

## 2012-10-25 DIAGNOSIS — W19XXXA Unspecified fall, initial encounter: Secondary | ICD-10-CM | POA: Diagnosis present

## 2012-10-25 DIAGNOSIS — R600 Localized edema: Secondary | ICD-10-CM

## 2012-10-25 DIAGNOSIS — R52 Pain, unspecified: Secondary | ICD-10-CM

## 2012-10-25 DIAGNOSIS — C50919 Malignant neoplasm of unspecified site of unspecified female breast: Secondary | ICD-10-CM

## 2012-10-25 DIAGNOSIS — R188 Other ascites: Secondary | ICD-10-CM

## 2012-10-25 DIAGNOSIS — K7460 Unspecified cirrhosis of liver: Secondary | ICD-10-CM

## 2012-10-25 DIAGNOSIS — F3289 Other specified depressive episodes: Secondary | ICD-10-CM | POA: Diagnosis present

## 2012-10-25 DIAGNOSIS — C786 Secondary malignant neoplasm of retroperitoneum and peritoneum: Secondary | ICD-10-CM | POA: Diagnosis present

## 2012-10-25 DIAGNOSIS — M7989 Other specified soft tissue disorders: Secondary | ICD-10-CM | POA: Diagnosis present

## 2012-10-25 DIAGNOSIS — D630 Anemia in neoplastic disease: Secondary | ICD-10-CM | POA: Diagnosis present

## 2012-10-25 DIAGNOSIS — D649 Anemia, unspecified: Secondary | ICD-10-CM | POA: Diagnosis present

## 2012-10-25 LAB — COMPREHENSIVE METABOLIC PANEL
ALT: 32 U/L (ref 0–35)
AST: 120 U/L — ABNORMAL HIGH (ref 0–37)
Albumin: 2 g/dL — ABNORMAL LOW (ref 3.5–5.2)
Alkaline Phosphatase: 171 U/L — ABNORMAL HIGH (ref 39–117)
Calcium: 10.6 mg/dL — ABNORMAL HIGH (ref 8.4–10.5)
GFR calc Af Amer: 60 mL/min — ABNORMAL LOW (ref >90)
Glucose, Bld: 116 mg/dL — ABNORMAL HIGH (ref 70–99)
Potassium: 4.8 mEq/L (ref 3.5–5.1)
Sodium: 129 mEq/L — ABNORMAL LOW (ref 135–145)
Total Protein: 4.6 g/dL — ABNORMAL LOW (ref 6.0–8.3)

## 2012-10-25 LAB — TYPE AND SCREEN
ABO/RH(D): A POS
Antibody Screen: NEGATIVE

## 2012-10-25 LAB — CBC WITH DIFFERENTIAL/PLATELET
Basophils Absolute: 0 10*3/uL (ref 0.0–0.1)
Eosinophils Absolute: 0.1 10*3/uL (ref 0.0–0.7)
Hemoglobin: 11.6 g/dL — ABNORMAL LOW (ref 12.0–15.0)
Lymphocytes Relative: 21 % (ref 12–46)
MCH: 32.8 pg (ref 26.0–34.0)
MCHC: 33.9 g/dL (ref 30.0–36.0)
Monocytes Absolute: 1.7 10*3/uL — ABNORMAL HIGH (ref 0.1–1.0)
Neutrophils Relative %: 66 % (ref 43–77)
Platelets: 119 10*3/uL — ABNORMAL LOW (ref 150–400)

## 2012-10-25 LAB — URINALYSIS, ROUTINE W REFLEX MICROSCOPIC
Nitrite: POSITIVE — AB
Protein, ur: 100 mg/dL — AB
Urobilinogen, UA: 2 mg/dL — ABNORMAL HIGH (ref 0.0–1.0)

## 2012-10-25 LAB — TSH: TSH: 0.668 u[IU]/mL (ref 0.350–4.500)

## 2012-10-25 LAB — URINE MICROSCOPIC-ADD ON

## 2012-10-25 LAB — APTT: aPTT: 45 seconds — ABNORMAL HIGH (ref 24–37)

## 2012-10-25 LAB — LACTIC ACID, PLASMA: Lactic Acid, Venous: 1.5 mmol/L (ref 0.5–2.2)

## 2012-10-25 MED ORDER — OXYCODONE HCL 5 MG PO TABS
5.0000 mg | ORAL_TABLET | ORAL | Status: DC | PRN
  Administered 2012-10-25: 5 mg via ORAL
  Filled 2012-10-25: qty 1

## 2012-10-25 MED ORDER — CIPROFLOXACIN IN D5W 400 MG/200ML IV SOLN
400.0000 mg | Freq: Two times a day (BID) | INTRAVENOUS | Status: DC
  Administered 2012-10-25: 400 mg via INTRAVENOUS
  Filled 2012-10-25: qty 200

## 2012-10-25 MED ORDER — PIPERACILLIN-TAZOBACTAM 3.375 G IVPB
3.3750 g | Freq: Once | INTRAVENOUS | Status: DC
  Filled 2012-10-25: qty 50

## 2012-10-25 MED ORDER — HYDROMORPHONE HCL 2 MG PO TABS: 2.0000 mg | ORAL_TABLET | ORAL | Status: DC | PRN

## 2012-10-25 MED ORDER — HYDROCORTISONE ACETATE 25 MG RE SUPP
25.0000 mg | Freq: Two times a day (BID) | RECTAL | Status: DC
  Administered 2012-10-25 – 2012-10-27 (×3): 25 mg via RECTAL
  Filled 2012-10-25 (×12): qty 1

## 2012-10-25 MED ORDER — ONDANSETRON HCL 4 MG/2ML IJ SOLN: 4.0000 mg | Freq: Four times a day (QID) | INTRAMUSCULAR | Status: DC | PRN

## 2012-10-25 MED ORDER — SODIUM CHLORIDE 0.9 % IV SOLN: 1000.0000 mL | Freq: Once | INTRAVENOUS | Status: DC

## 2012-10-25 MED ORDER — ONDANSETRON 8 MG PO TBDP
8.0000 mg | ORAL_TABLET | Freq: Two times a day (BID) | ORAL | Status: DC | PRN
  Filled 2012-10-25: qty 1

## 2012-10-25 MED ORDER — SALINE NASAL SPRAY 0.65 % NA SOLN: 1.0000 | NASAL | Status: DC | PRN

## 2012-10-25 MED ORDER — FLUOXETINE HCL 20 MG PO CAPS
20.0000 mg | ORAL_CAPSULE | Freq: Every day | ORAL | Status: DC
  Administered 2012-10-25 – 2012-10-28 (×4): 20 mg via ORAL
  Filled 2012-10-25 (×6): qty 1

## 2012-10-25 MED ORDER — LEVOTHYROXINE SODIUM 75 MCG PO TABS
75.0000 ug | ORAL_TABLET | Freq: Every day | ORAL | Status: DC
  Administered 2012-10-26 – 2012-10-30 (×5): 75 ug via ORAL
  Filled 2012-10-25 (×7): qty 1

## 2012-10-25 MED ORDER — LORAZEPAM 1 MG PO TABS
1.0000 mg | ORAL_TABLET | Freq: Four times a day (QID) | ORAL | Status: DC | PRN
  Administered 2012-10-25: 1 mg via ORAL
  Filled 2012-10-25: qty 1

## 2012-10-25 MED ORDER — HYDROCORTISONE ACE-PRAMOXINE 2.5-1 % RE CREA
TOPICAL_CREAM | Freq: Three times a day (TID) | RECTAL | Status: DC
  Administered 2012-10-25 – 2012-10-29 (×9): via RECTAL
  Filled 2012-10-25: qty 30

## 2012-10-25 MED ORDER — PIPERACILLIN-TAZOBACTAM 3.375 G IVPB
3.3750 g | Freq: Once | INTRAVENOUS | Status: AC
  Administered 2012-10-25: 3.375 g via INTRAVENOUS
  Filled 2012-10-25: qty 50

## 2012-10-25 MED ORDER — SODIUM CHLORIDE 0.9 % IV SOLN
1000.0000 mL | INTRAVENOUS | Status: DC
  Administered 2012-10-25: 1000 mL via INTRAVENOUS

## 2012-10-25 MED ORDER — ALBUMIN HUMAN 25 % IV SOLN
25.0000 g | Freq: Four times a day (QID) | INTRAVENOUS | Status: AC
  Administered 2012-10-25 (×2): 25 g via INTRAVENOUS
  Filled 2012-10-25 (×3): qty 100

## 2012-10-25 MED ORDER — SUCRALFATE 1 G PO TABS
1.0000 g | ORAL_TABLET | Freq: Three times a day (TID) | ORAL | Status: DC
  Administered 2012-10-25 – 2012-10-30 (×17): 1 g via ORAL
  Filled 2012-10-25 (×24): qty 1

## 2012-10-25 MED ORDER — LACTULOSE 10 GM/15ML PO SOLN
30.0000 g | Freq: Three times a day (TID) | ORAL | Status: DC
  Administered 2012-10-25 (×3): 30 g via ORAL
  Filled 2012-10-25 (×9): qty 45

## 2012-10-25 MED ORDER — PANTOPRAZOLE SODIUM 40 MG PO TBEC
40.0000 mg | DELAYED_RELEASE_TABLET | Freq: Every day | ORAL | Status: DC
  Administered 2012-10-25 – 2012-10-26 (×2): 40 mg via ORAL
  Filled 2012-10-25 (×2): qty 1

## 2012-10-25 MED ORDER — PIPERACILLIN-TAZOBACTAM 3.375 G IVPB
3.3750 g | Freq: Three times a day (TID) | INTRAVENOUS | Status: DC
  Administered 2012-10-25 – 2012-10-27 (×6): 3.375 g via INTRAVENOUS
  Filled 2012-10-25 (×7): qty 50

## 2012-10-25 MED ORDER — ONDANSETRON HCL 4 MG PO TABS: 4.0000 mg | ORAL_TABLET | Freq: Four times a day (QID) | ORAL | Status: DC | PRN

## 2012-10-25 MED ORDER — ENSURE COMPLETE PO LIQD
237.0000 mL | Freq: Two times a day (BID) | ORAL | Status: DC
  Administered 2012-10-25: 237 mL via ORAL

## 2012-10-25 MED ORDER — TEMAZEPAM 15 MG PO CAPS
15.0000 mg | ORAL_CAPSULE | Freq: Every evening | ORAL | Status: DC | PRN
  Administered 2012-10-25 – 2012-10-28 (×3): 15 mg via ORAL
  Filled 2012-10-25 (×3): qty 1

## 2012-10-25 MED ORDER — SALINE SPRAY 0.65 % NA SOLN
1.0000 | NASAL | Status: DC | PRN
  Filled 2012-10-25: qty 44

## 2012-10-25 MED ORDER — SODIUM CHLORIDE 0.9 % IV SOLN: 1000.0000 mL | INTRAVENOUS | Status: DC

## 2012-10-25 MED ORDER — DICLOFENAC SODIUM 1 % TD GEL
2.0000 g | Freq: Two times a day (BID) | TRANSDERMAL | Status: DC | PRN
  Filled 2012-10-25: qty 100

## 2012-10-25 MED ORDER — SIMETHICONE 80 MG PO CHEW
160.0000 mg | CHEWABLE_TABLET | Freq: Four times a day (QID) | ORAL | Status: DC | PRN
  Filled 2012-10-25: qty 2

## 2012-10-25 NOTE — ED Notes (Signed)
See triage assessment

## 2012-10-25 NOTE — Progress Notes (Signed)
ANTIBIOTIC CONSULT NOTE - INITIAL  Pharmacy Consult for Zosyn Indication: UTI  Allergies  Allergen Reactions  . Cephalexin Nausea Only  . Contrast Media (Iodinated Diagnostic Agents) Rash    GIVE BENADRYL PRIOR  . Other Nausea And Vomiting and Rash    "MYCIN" DRUGS  . Sulfa Antibiotics Nausea And Vomiting and Rash    Patient Measurements: Height: 5\' 5"  (165.1 cm) Weight: 163 lb 2.3 oz (74 kg) IBW/kg (Calculated) : 57  Vital Signs: Temp: 97.5 F (36.4 C) (08/04 0840) Temp src: Oral (08/04 0840) BP: 104/48 mmHg (08/04 0840) Pulse Rate: 105 (08/04 0840)  Labs:  Recent Labs  10/25/12 0600  WBC 14.4*  HGB 11.6*  PLT 119*  CREATININE 1.05   Estimated Creatinine Clearance: 49.5 ml/min (by C-G formula based on Cr of 1.05).   Microbiology: Recent Results (from the past 720 hour(s))  URINE CULTURE     Status: None   Collection Time    10/18/12  6:09 PM      Result Value Range Status   Specimen Description URINE, CLEAN CATCH   Final   Special Requests NONE   Final   Culture  Setup Time 10/18/2012 22:25   Final   Colony Count >=100,000 COLONIES/ML   Final   Culture ENTEROBACTER CLOACAE   Final   Report Status 10/20/2012 FINAL   Final   Organism ID, Bacteria ENTEROBACTER CLOACAE   Final  MRSA PCR SCREENING     Status: None   Collection Time    10/18/12  7:10 PM      Result Value Range Status   MRSA by PCR NEGATIVE  NEGATIVE Final   Comment:            The GeneXpert MRSA Assay (FDA     approved for NASAL specimens     only), is one component of a     comprehensive MRSA colonization     surveillance program. It is not     intended to diagnose MRSA     infection nor to guide or     monitor treatment for     MRSA infections.    Medications:  Anti-infectives   Start     Dose/Rate Route Frequency Ordered Stop   10/25/12 1030  piperacillin-tazobactam (ZOSYN) IVPB 3.375 g     3.375 g 12.5 mL/hr over 240 Minutes Intravenous  Once 10/25/12 1023     10/25/12 0630   ciprofloxacin (CIPRO) IVPB 400 mg  Status:  Discontinued     400 mg 200 mL/hr over 60 Minutes Intravenous Every 12 hours 10/25/12 1610 10/25/12 9604     Assessment: 72 yo F admitted 8/4 with weakness, fall, and UTI.  She was recently admitted from 7/28 - 8/1 and was discharged on Cipro for Enterobacter UTI.  Pharmacy is asked to dose Zosyn for continued/recurrent UTI (concern for resistance to FQ).  Previous urine culture 7/28:  > 100,000 Enterobacter (sens: ceftriaxone, cipro, gent, levo, NTF, P/T, T/S)  SCr 1.05, CrCl ~ 49 ml/min CG  WBC 14.4   Urine culture 8/4:  Pending   Goal of Therapy:  Appropriate abx dosing, eradication of infection.  Plan:   Zosyn 3.375g IV Q8H infused over 4hrs.  Follow up renal function and culture results as available.  Lynann Beaver PharmD, BCPS Pager [phone] 10/25/2012 10:34 AM

## 2012-10-25 NOTE — ED Notes (Signed)
Report from previous nurse,pt was dc from hospital on Friday.hx of cancer. Reports went home and then had abdominal distension and increased weakness. Brought back to ED. Pt did have recent fall. [place] rn reported bruise to left buttock and elbow.

## 2012-10-25 NOTE — ED Notes (Signed)
Report given to Brynda Rim on floor

## 2012-10-25 NOTE — H&P (Addendum)
[facility] History and Physical  Kelli [name] WUJ:[id number] DOB: [birth year] DOA: 10/25/2012  Referring physician:  Linwood Dibbles PCP:  [name], MD   Chief Complaint:  Weakness, falls, confusion  HPI:  The patient is a 72 y.o. year-old female with history of metastatic T1N3 invasive left breast cancer with extension to 13 nodes including extracapsular extension, ER + 67%, PR 39%, HER-2/neu negative s/p lumpectomy, 18 node axillary dissection, adjuvant adriamycin/cytoxan followed by [facility] chemotherapy and local radiation.  She has mets to liver, bone, peritoneum, retroperitoneal space.  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  She underwent paracentesis and was hospitalized for several days to recover from acute kidney injury.  She was followed by oncology.  She was discharged home and became progressively weaker.  She had a fall at home and her husband was not able to lift her.  She has been confused and had increased abdominal and lower extremity swelling.  She has not had a BM since discharge two days ago but she has also not had any bloody bowel movements.  The family brought her to the ER today because of failure to thrive.    In the ER, labs were notable for sodium 129, creatinine 1.05, calcium 10.6, albumin 2, AST 120, ALT 32, total bilirubin 5.7, ammonia level 61, INR 2, WBC 14.4, hgb 11.6, plt 119, UA with persistent elevated WBC and now positive for nitrite as well as LE.  The patient asked if she could "have a paracentesis and then go home to die."  She told her husband "I do not want to live like this."     Review of Systems:  Denies fevers, chills, weight loss or gain, changes to hearing and vision.  Denies rhinorrhea, sinus congestion, sore throat.  Denies chest pain and palpitations.  Denies SOB, wheezing, cough.   Denies nausea, vomiting, constipation.  Denies dysuria, frequency, urgency, polyuria, polydipsia.  Epistaxis yesterday which resolved with nasal spray and pressure. Denies hematemesis, blood in stools, melena, abnormal bruising.  Denies lymphadenopathy.  Denies arthralgias, myalgias.  Denies skin rash or ulcer.  Worsening lower extremity edema.  Denies focal numbness, weakness, slurred speech, facial droop, but does have generalized weakness and confusion.  Denies anxiety and depression.    Past Medical History  Diagnosis Date  . Thyroid disease IN 1982    HX IODINE-131 ABLATION FOR HYPERTHYROIDISM  . Osteoporosis   . Vitamin D insufficiency   . Chronic fatigue   . Depression   . Anxiety   . Insomnia   . History of chemotherapy     Adriamycin/cytoxan followed by [facility]  08/2000/tamoxifen 06/2001-02/2003,then arimidex 02/2003-08/2009  . 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 History of radiation therapy 12/28/00-02/12/01    left breast  With Dr. Jamie Kato  . Hypothyroidism   . Fatigue   . Britny Riel of breath on exertion   . Breast cancer, left JUNE OF 2002    LEFT BREAST, ER/PR +, HER 2 -  . 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 Metastatic cancer to liver 09/24/11    CURRENTLY RECEIVING RADIATION  . Radiation 10/15/11-10/28/2011    left proximal leg  . Hydronephrosis, left     SECONDARY TO RETROPERITONEAL METS   Past Surgical History  Procedure Laterality Date  . Cholecystectomy  1970  . Femur im nail  09/24/2011     Procedure: INTRAMEDULLARY (IM) NAIL FEMORAL;  Surgeon: Toni Arthurs, MD;  Location: [facility];  Service: Orthopedics;  Laterality: Left;  FEMURAL NECK RX/ INTERTROCHANTERIC PATHOLOGIC FX  . Bone biopsy  09/25/2011    left medullary canal femur reaming=Metastatic Carcinoma  . Hysteroscopy w/d&c  01-11-2003    REMOVAL POLYP  . Left partial mastectomy / sentinel lymph node bx  08-28-2000    LEFT BREAST CANCER  . Cysto/ left retrograde pyleogram/  left  ureteral stent placement  10-30-2011  . Cystoscopy w/ ureteral stent placement Left 07/22/2012    Procedure: CYSTOSCOPY WITH STENT REPLACEMENT;  Surgeon: Marcine Matar, MD;  Location: [facility];  Service: Urology;  Laterality: Left;   Social History:  reports that she has never smoked. She has never used smokeless tobacco. She reports that she does not drink alcohol or use illicit drugs. Came from home with her husband with home health services but husband cannot care for her anymore due to her progressive weakness.   Allergies  Allergen Reactions  . Cephalexin Nausea Only  . Contrast Media (Iodinated Diagnostic Agents) Rash    GIVE BENADRYL PRIOR  . Other Nausea And Vomiting and Rash    "MYCIN" DRUGS  . Sulfa Antibiotics Nausea And Vomiting and Rash    Family History  Problem Relation Age of Onset  . Cancer Mother     breast  . Heart attack Father   . CVA Sister   . Dementia Brother      Prior to Admission medications   Medication Sig Start Date End Date Taking? Authorizing Provider  ciprofloxacin (CIPRO) 500 MG tablet Take 1 tablet (500 mg total) by mouth 2 (two) times daily. 10/22/12  Yes Renae Fickle, MD  diclofenac sodium (VOLTAREN) 1 % GEL Apply 2 g topically 2 (two) times daily as needed (for pain). Apply to knee 03/09/12  Yes Lennis Buzzy Han, MD  docusate sodium (COLACE) 100 MG capsule Take 100 mg by mouth daily.   Yes Historical Provider, MD  feeding supplement (ENSURE COMPLETE) LIQD Take 237 mLs by mouth 2 (two) times daily between meals. 10/22/12  Yes Renae Fickle, MD  FLUoxetine (PROZAC) 20 MG capsule Take 20 mg by mouth daily at 12 noon.   Yes Historical Provider, MD  fulvestrant (FASLODEX) 250 MG/5ML injection Inject 250 mg into the muscle every 30 (thirty) days. One injection each buttock over 1-2 minutes. Warm prior to use.   Yes Historical Provider, MD  HYDROcodone-acetaminophen (NORCO/VICODIN) 5-325 MG per tablet Take 1 tablet by mouth every 6  (six) hours as needed for pain. 10/13/12  Yes Augustin Schooling, NP  hydrocortisone (ANUSOL-HC) 25 MG suppository Place 1 suppository (25 mg total) rectally 2 (two) times daily. 10/22/12  Yes Renae Fickle, MD  hydrocortisone-pramoxine [facility]) 2.5-1 % rectal cream Place rectally 3 (three) times daily. 10/22/12  Yes Renae Fickle, MD  HYDROmorphone (DILAUDID) 2 MG tablet Take 1 tablet (2 mg total) by mouth every 4 (four) hours as needed for pain. 09/17/12  Yes Lennis Buzzy Han, MD  levothyroxine (SYNTHROID, LEVOTHROID) 75 MCG tablet Take 75 mcg by mouth every morning.    Yes Historical Provider, MD  lidocaine-prilocaine (EMLA) cream Apply topically as needed. Apply to PortaCath site as directed 2 hours prior to access. 08/06/12  Yes Lennis Buzzy Han, MD  LORazepam (ATIVAN) 1 MG tablet Take 1 tablet (1 mg total) by mouth every 6 (six) hours as needed (for nausea or to relax). 10/13/12  Yes Mardella Layman  Alexander, NP  ondansetron (ZOFRAN-ODT) 8 MG disintegrating tablet Take 8 mg by mouth every 12 (twelve) hours as needed for nausea.   Yes Historical Provider, MD  pantoprazole (PROTONIX) 40 MG tablet Take 40 mg by mouth daily.  07/27/12  Yes Historical Provider, MD  polyethylene glycol powder (MIRALAX) powder Take 17 g by mouth as needed.   Yes Historical Provider, MD  sodium chloride (OCEAN) 0.65 % nasal spray Place 1 spray into the nose as needed (to keep nasal passages moist). Use every 1-2 hours while awake to keep nasal passages moist. 12/02/11  Yes Lennis Buzzy Han, MD  sucralfate (CARAFATE) 1 G tablet Take 1 tablet (1 g total) by mouth 4 (four) times daily -  before meals and at bedtime. Can dissolve in water, for stomach 08/24/12  Yes Lennis P Livesay, MD  temazepam (RESTORIL) 15 MG capsule Take 1 capsule (15 mg total) by mouth at bedtime as needed for sleep. 10/13/12  Yes Augustin Schooling, NP  zolendronic acid (ZOMETA) 4 MG/5ML injection Inject 4 mg into the vein once.   Yes Historical Provider, MD   zolpidem (AMBIEN) 5 MG tablet Take 1 tablet (5 mg total) by mouth at bedtime as needed for sleep. 10/13/12  Yes Augustin Schooling, NP  Simethicone (GAS-X PO) Take 1 tablet by mouth daily as needed (for gas).     Historical Provider, MD   Physical Exam: Filed Vitals:   10/25/12 1610 10/25/12 0719 10/25/12 0730 10/25/12 0800  BP:  96/54 98/51 96/57   Pulse:  104 103 104  Temp:      TempSrc:      Resp:  25 26 22   SpO2: 99% 97% 97%      General:  Cachectic CF, mildly jaundiced, NAD  Eyes:  PERRL, + scleral icterus, non-injected.  ENT:  Nares clear.  OP clear, non-erythematous without plaques or exudates.  MMM.  Neck:  Supple without TM or JVD.    Lymph:  No cervical, supraclavicular, or submandibular LAD.  Cardiovascular:  RRR, normal S1, S2, without m/r/g.  2+ pulses, warm extremities  Respiratory:  Diminished at the bilateral bases with faint rales without wheeze or rhonchi, no increased WOB.  Abdomen:  NABS.  Soft, moderately distended but not tense, mildly TTP diffusely without rebound or guarding.    Skin:  No rashes or focal lesions.  Musculoskeletal:  Normal bulk and tone.  2+ soft pitting LE edema.  Psychiatric:  A & O to person, place, but not time.  Slow to answer questions.  Asleep but easily arouseable.  Neurologic:  CN 3-12 intact.  5/5 strength.  Sensation intact.  Labs on Admission:  Basic Metabolic Panel:  Recent Labs Lab 10/19/12 0725 10/20/12 0525 10/21/12 0416 10/22/12 0330 10/25/12 0600  NA 133* 132* 131* 131* 129*  K 4.1 4.0 3.7 4.0 4.8  CL 99 100 100 99 99  CO2 24 23 21 21 23   GLUCOSE 88 148* 154* 127* 116*  BUN 29* 30* 27* 25* 21  CREATININE 1.40* 1.37* 1.10 1.05 1.05  CALCIUM 9.9 9.5 9.6 10.3 10.6*   Liver Function Tests:  Recent Labs Lab 10/18/12 1616 10/19/12 0725 10/25/12 0600  AST 78* 80* 120*  ALT 16 16 32  ALKPHOS 128* 127* 171*  BILITOT 3.2* 4.2* 5.7*  PROT 5.0* 4.8* 4.6*  ALBUMIN 2.4* 2.2* 2.0*    Recent Labs Lab  10/18/12 1616  LIPASE 32    Recent Labs Lab 10/25/12 0600  AMMONIA 61*   CBC:  Recent  Labs Lab 10/18/12 1616 10/19/12 0725 10/20/12 0805 10/21/12 0416 10/22/12 0330 10/25/12 0600  WBC 16.3* 12.0* 11.7* 10.4 12.7* 14.4*  NEUTROABS 10.5*  --   --   --   --  9.6*  HGB 6.3* 11.8* 12.8 11.7* 12.9 11.6*  HCT 19.2* 34.9* 37.9 34.7* 38.1 34.2*  MCV 108.5* 95.4 94.8 96.4 96.0 96.6  PLT 173 120* 129* 115* 124* 119*   Cardiac Enzymes:  Recent Labs Lab 10/18/12 1616 10/25/12 0600  TROPONINI <0.30 <0.30    BNP (last 3 results) No results found for this basename: PROBNP,  in the last 8760 hours CBG:  Recent Labs Lab 10/19/12 0817 10/20/12 0734 10/21/12 0655 10/22/12 0728  GLUCAP 83 136* 149* 138*    Radiological Exams on Admission: Dg Chest Port 1 View  10/25/2012   *RADIOLOGY REPORT*  Clinical Data: Weakness  PORTABLE CHEST - 1 VIEW  Comparison: Prior radiograph from 10/18/2012  Findings: Right-sided Port-A-Cath is unchanged.  Cardiac and mediastinal silhouettes are stable in size and contour, and remain within normal limits. Multiple surgical clips overlie the left axilla, unchanged.  The lungs are mildly hypoinflated. Bibasilar linear opacities are similar to prior exam, and likely represent atelectasis or scarring.  No acute infiltrate or pulmonary edema is identified. Remote fracture the right seventh rib is again noted.  No pneumothorax.  IMPRESSION: Stable exam with no acute cardiopulmonary process identified.   Original Report Authenticated By: Rise Mu, M.D.    EKG: Independently reviewed. Sinus tachycardia  Assessment/Plan Principal Problem:   UTI (urinary tract infection) Active Problems:   Breast cancer   Anemia   Hypothyroidism   Lower GI bleed   Coagulopathy   Cirrhosis of liver   Recurrent falls   Encephalopathy, hepatic   Lower extremity edema   Ascites   UTI while on antibiotics.  Previously being treated for Enterobacter UTI but  seems to have progressed on ciprofloxacin -  Zosyn pending culture results (intolerant to cephalosporins)  Borderline hypoTN, possible sepsis, meets SIRS criteria with tachycardia, leukocytosis -  Albumin infusion  Hepatic encephalopathy -  Start lactulose  Lower extremity swelling, likely due to anasarca, but rule out DVT -  Duplex  -  If positive, would be helpful for prognostication only  Breast cancer with metastases to multiple sites including the liver.  She is supposed to undergo a staging CT scan after her most recent chemotherapy, however, the patient seems to want more palliative measures.  I will touch base with oncology.  I do not think that she would tolerate further chemotherapy given her progressively worsening functional status and if the CT scan would not change management, it may not be necessary.   -  Will touch base with Oncology -  Palliative care consultation for assistance with symptom management:  Constipation, pain, and consideration of palliative peritoneal drain.     Falls and deconditioning -  She clearly needs SNF or inpatient hospice.  Husband is not able to care for her at home.  Will have PT/OT evaluate her again.    Recent rectal bleeding due to hemorrhoids.  Not a surgical candidate. -  Stool softeners, anusol, and SCDs  Malignant ascites: She underwent a paracentesis during previous admission with removal of 5 L of fluid without complication.  -  Have discussed possibility of palliative peritoneal drain again with patient and she would like to defer again at this time.    Malignant hydronephrosis, managed by Doctor Dahlstedt from Urology.   Hypothyroidism, TSH. Continue  Synthroid daily.   Moderate protein calorie malnutrition. Supplements and regular diet.   Leukocytosis, may be secondary to UTI. Normocytic anemia due to GIB and chronic illness.   Thrombocytopenia due to cirrhosis, no active bleeding Hyponatremia due to cirrhosis.    Hypercalcemia, likely due to bone metastases Coagulopathy, mildly elevated INR and elevated LFTs.  Given vitamin K during last admission.  Diet:  regular Access:  Port right chest IVF:  albumin Proph:  SCDs  Code Status: DNR Family Communication: spoke with patient and her husband Disposition Plan: med surg  Time spent: 60 min  Renae Fickle [facility] Pager [phone]  If 7PM-7AM, please contact night-coverage [email or web address] Password [email or web address] 10/25/2012, 8:33 AM

## 2012-10-25 NOTE — Progress Notes (Signed)
Patient Kelli [name]      DOB: 25-Oct-1940      WUJ:[id number]     Consult Note from the Palliative Medicine Team at [facility]    Consult Requested by: Dr Malachi Bonds     PCP: [name], MD Reason for Consultation:Clarification of GOC and options     Phone [phone]    Consult is for review of medical treatment options, clarification of goals of care and end of life issues, disposition and options, and symptom recommendation.  This NP Lorinda Creed reviewed medical records, received report from team, assessed the patient and then meet at the patient's bedside along with her husband Kelli [name] #C [phone] and son Kelli [name] # [phone]  to discuss diagnosis prognosis, GOC, EOL wishes disposition and options.   A detailed discussion was had today regarding advanced directives.  Concepts specific to code status, artifical feeding and hydration, continued IV antibiotics and rehospitalization was had.  The difference between a aggressive medical intervention path  and a palliative comfort care path for this patient at this time was had.  Values and goals of care important to patient and family were attempted to be elicited.  Concept of Hospice and Palliative Care were discussed  Natural trajectory and expectations at EOL were discussed.  Questions and concerns addressed.  Family encouraged to call with questions or concerns.  PMT will continue to support holistically.   Assessment of patients Current state:The patient is a 72 y.o. year-old female with history of metastatic T1N3 invasive left breast cancer with extension to 13 nodes including extracapsular extension, ER + 67%, PR 39%, HER-2/neu negative s/p lumpectomy, 18 node axillary dissection, adjuvant adriamycin/cytoxan followed by [facility] chemotherapy and local radiation. She has mets to liver, bone, peritoneum, retroperitoneal space. 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 She underwent paracentesis and was hospitalized for several days to recover from acute kidney injury. She was followed by oncology. She was discharged home and became progressively weaker. She had a fall at home and her husband was not able to lift her. She has been confused and had increased abdominal and lower extremity swelling. She has not had a BM since discharge two days ago but she has also not had any bloody bowel movements. The family brought her to the ER today because of failure to thrive.  In the ER, labs were notable for sodium 129, creatinine 1.05, calcium 10.6, albumin 2, AST 120, ALT 32, total bilirubin 5.7, ammonia level 61, INR 2, WBC 14.4, hgb 11.6, plt 119, UA with persistent elevated WBC and now positive for nitrite as well as LE.   Patient and family are in process of making EOL decisions understanding her limited prognosis.   Goals of Care: 1.  Code Status: DNR/DNI   2. Scope of Treatment: 1. Vital Signs:per unit  2. Respiratory/Oxygen:for comfort 3. Nutritional Support/Tube Feeds:no artifical feeding now or in the future 4. Antibiotics:yes for current infection, will consider limitation of use into the fututere 5. Blood Products: none 6. UVO:ZDGUY  discharge  7. Review of Medications to be discontinued: continue preestn medciation 8. Labs: until discahrge 9. Telemetry:none 10. Consults:Interventional for paracentesis for comfort.   4. Disposition:  Patient and family wish to discuss within the family and readdress in the morning.  Husband "knows" he cannot take care of her at home. Strongly considering residential hospice with comfort  as the main focus of care.  Will re meet in the morning at 0900   3. Symptom Management:   1. Anxiety/Agitation: Ativan 1 mg every 6 hrs PO prn 2. Pain: Oxycodone 5 mg PO every 3 hrs prn 3. Bowel Regimen: Lactulose  per attending 4.  Psychosocial:  Emotional  support offered to patient and family at bedside.  All understand the overall poor prognosis; this is the first time patient and family have had the opportunity to process out loud.  Although this is very difficult they are grateful for today's conversation  5. Spiritual:  Denies chaplain visits at this time    Brief HPI:The patient is a 72 y.o. year-old female with history of metastatic T1N3 invasive left breast cancer with extension to 13 nodes including extracapsular extension, ER + 67%, PR 39%, HER-2/neu negative s/p lumpectomy, 18 node axillary dissection, adjuvant adriamycin/cytoxan followed by CMF chemotherapy and local radiation. She has mets to liver, bone, peritoneum, retroperitoneal space. 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 She underwent paracentesis and was hospitalized for several days to recover from acute kidney injury. She was followed by oncology. She was discharged home and became progressively weaker. She had a fall at home and her husband was not able to lift her. She has been confused and had increased abdominal and lower extremity swelling. She has not had a BM since discharge two days ago but she has also not had any bloody bowel movements. The family brought her to the ER today because of failure to thrive.  In the ER, labs were notable for sodium 129, creatinine 1.05, calcium 10.6, albumin 2, AST 120, ALT 32, total bilirubin 5.7, ammonia level 61, INR 2, WBC 14.4, hgb 11.6, plt 119, UA with persistent elevated WBC and now positive for nitrite as well as LE.     ROS: weakness, fatigue, generalized discomfort    PMH:  Past Medical History  Diagnosis Date  . Thyroid disease IN 1982    HX IODINE-131 ABLATION FOR HYPERTHYROIDISM  . Osteoporosis   . Vitamin D insufficiency    . Chronic fatigue   . Depression   . Anxiety   . Insomnia   . History of chemotherapy     Adriamycin/cytoxan followed by [facility]  08/2000/tamoxifen 06/2001-02/2003,then arimidex 02/2003-08/2009  . 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 History of radiation therapy 12/28/00-02/12/01    left breast  With Dr. Jamie Kato  . Hypothyroidism   . Fatigue   . Short of breath on exertion   . Breast cancer, left JUNE OF 2002    LEFT BREAST, ER/PR +, HER 2 -  . 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 Metastatic cancer to liver 09/24/11    CURRENTLY RECEIVING RADIATION  . Radiation 10/15/11-10/28/2011    left proximal leg  . Hydronephrosis, left     SECONDARY TO RETROPERITONEAL METS     PSH: Past Surgical History  Procedure Laterality Date  . Cholecystectomy  1970  . Femur im nail  09/24/2011     Procedure: INTRAMEDULLARY (IM) NAIL FEMORAL;  Surgeon: Toni Arthurs, MD;  Location: [facility];  Service: Orthopedics;  Laterality: Left;  FEMURAL NECK RX/ INTERTROCHANTERIC PATHOLOGIC FX  . Bone biopsy  09/25/2011    left medullary canal femur reaming=Metastatic Carcinoma  . Hysteroscopy w/d&c  01-11-2003    REMOVAL  POLYP  . Left partial mastectomy / sentinel lymph node bx  08-28-2000    LEFT BREAST CANCER  . Cysto/ left retrograde pyleogram/  left ureteral stent placement  10-30-2011  . Cystoscopy w/ ureteral stent placement Left 07/22/2012    Procedure: CYSTOSCOPY WITH STENT REPLACEMENT;  Surgeon: Marcine Matar, MD;  Location: [facility];  Service: Urology;  Laterality: Left;   I have reviewed the FH and SH and  If appropriate update it with new information. Allergies  Allergen Reactions  . Cephalexin Nausea Only  . Contrast Media (Iodinated Diagnostic Agents) Rash    GIVE BENADRYL PRIOR  . Other Nausea And Vomiting and Rash    "MYCIN" DRUGS  . Sulfa Antibiotics Nausea And Vomiting and Rash   Scheduled Meds: . albumin human  25 g Intravenous Q6H  .  feeding supplement  237 mL Oral BID BM  . FLUoxetine  20 mg Oral Q1200  . hydrocortisone  25 mg Rectal BID  . hydrocortisone-pramoxine   Rectal TID  . lactulose  30 g Oral TID  . levothyroxine  75 mcg Oral QAC breakfast  . pantoprazole  40 mg Oral Daily  . piperacillin-tazobactam (ZOSYN)  IV  3.375 g Intravenous Once  . piperacillin-tazobactam (ZOSYN)  IV  3.375 g Intravenous Once  . sucralfate  1 g Oral TID AC & HS   Continuous Infusions:  PRN Meds:.diclofenac sodium, HYDROmorphone, LORazepam, ondansetron (ZOFRAN) IV, ondansetron, ondansetron, simethicone, sodium chloride, temazepam    BP 104/48  Pulse 105  Temp(Src) 97.5 F (36.4 C) (Oral)  Resp 20  Ht 5\' 5"  (1.651 m)  Wt 74 kg (163 lb 2.3 oz)  BMI 27.15 kg/m2  SpO2 100%   PPS:30 %  No intake or output data in the 24 hours ending 10/25/12 1106  LBM: PTA                       Stool Softner: lactulose ordered on admission  Physical Exam:  General:  Chronically ill appearing, NAD HEENT:  Mm, no exudate, + temporal muscle wasting Chest:   Diminished in bases ,CTA CVS:  tachycardic Abdomen: distended, firm, decreased BS Ext: +3 BLE edema Neuro:alert and oriented X3, engaged in conversation regarding her medical situation and her choices  Labs: CBC    Component Value Date/Time   WBC 14.4* 10/25/2012 0600   WBC 13.3* 10/13/2012 1312   RBC 3.54* 10/25/2012 0600   RBC 2.68* 10/13/2012 1312   RBC 3.57* 09/24/2011 1438   HGB 11.6* 10/25/2012 0600   HGB 9.7* 10/13/2012 1312   HCT 34.2* 10/25/2012 0600   HCT 29.3* 10/13/2012 1312   PLT 119* 10/25/2012 0600   PLT 114* 10/13/2012 1312   MCV 96.6 10/25/2012 0600   MCV 109.6* 10/13/2012 1312   MCH 32.8 10/25/2012 0600   MCH 36.4* 10/13/2012 1312   MCHC 33.9 10/25/2012 0600   MCHC 33.2 10/13/2012 1312   RDW 22.4* 10/25/2012 0600   RDW 21.8* 10/13/2012 1312   LYMPHSABS 3.0 10/25/2012 0600   LYMPHSABS 3.9* 10/13/2012 1312   MONOABS 1.7* 10/25/2012 0600   MONOABS 0.9 10/13/2012 1312   EOSABS 0.1  10/25/2012 0600   EOSABS 0.1 10/13/2012 1312   BASOSABS 0.0 10/25/2012 0600   BASOSABS 0.1 10/13/2012 1312    BMET    Component Value Date/Time   NA 129* 10/25/2012 0600   NA 135* 10/13/2012 1312   K 4.8 10/25/2012 0600   K 4.2 10/13/2012 1312   CL  99 10/25/2012 0600   CL 109* 09/07/2012 1420   CO2 23 10/25/2012 0600   CO2 23 10/13/2012 1312   GLUCOSE 116* 10/25/2012 0600   GLUCOSE 162* 10/13/2012 1312   GLUCOSE 148* 09/07/2012 1420   BUN 21 10/25/2012 0600   BUN 13.6 10/13/2012 1312   CREATININE 1.05 10/25/2012 0600   CREATININE 1.1 10/13/2012 1312   CALCIUM 10.6* 10/25/2012 0600   CALCIUM 9.5 10/13/2012 1312   GFRNONAA 52* 10/25/2012 0600   GFRAA 60* 10/25/2012 0600    CMP     Component Value Date/Time   NA 129* 10/25/2012 0600   NA 135* 10/13/2012 1312   K 4.8 10/25/2012 0600   K 4.2 10/13/2012 1312   CL 99 10/25/2012 0600   CL 109* 09/07/2012 1420   CO2 23 10/25/2012 0600   CO2 23 10/13/2012 1312   GLUCOSE 116* 10/25/2012 0600   GLUCOSE 162* 10/13/2012 1312   GLUCOSE 148* 09/07/2012 1420   BUN 21 10/25/2012 0600   BUN 13.6 10/13/2012 1312   CREATININE 1.05 10/25/2012 0600   CREATININE 1.1 10/13/2012 1312   CALCIUM 10.6* 10/25/2012 0600   CALCIUM 9.5 10/13/2012 1312   PROT 4.6* 10/25/2012 0600   PROT 5.3* 10/13/2012 1312   ALBUMIN 2.0* 10/25/2012 0600   ALBUMIN 2.6* 10/13/2012 1312   AST 120* 10/25/2012 0600   AST 85* 10/13/2012 1312   ALT 32 10/25/2012 0600   ALT 17 10/13/2012 1312   ALKPHOS 171* 10/25/2012 0600   ALKPHOS 133 10/13/2012 1312   BILITOT 5.7* 10/25/2012 0600   BILITOT 3.35* 10/13/2012 1312   GFRNONAA 52* 10/25/2012 0600   GFRAA 60* 10/25/2012 0600       Time In Time Out Total Time Spent with Patient Total Overall Time  0930 1055 80 min 85 min    Greater than 50%  of this time was spent counseling and coordinating care related to the above assessment and plan.  Lorinda Creed NP  Palliative Medicine Team Team Phone # [phone] Pager [phone]  Discussed above with Dr Malachi Bonds

## 2012-10-25 NOTE — ED Notes (Signed)
ZOX:[id number]<AV> Expected date:<BR> Expected time:<BR> Means of arrival:<BR> Comments:<BR> Hold until Bed 7 is cleared.

## 2012-10-25 NOTE — Progress Notes (Signed)
[facility]  Patient Status: Active (receiving services up to time of hospitalization)  [facility] is providing the following services: RN, PT and OT  If patient discharges after hours, please call [phone].   Lanae Crumbly 10/25/2012, 10:34 AM

## 2012-10-25 NOTE — ED Notes (Addendum)
Pt alert and oriented x4. Respirations even and unlabored, bilateral symmetrical rise and fall of chest. Skin warm and dry. In no acute distress. Denies needs.   hospitalist at bedside

## 2012-10-25 NOTE — ED Provider Notes (Signed)
CSN: [id number]     Arrival date & time 10/25/12  0445 History     First MD Initiated Contact with Patient 10/25/12 0449     Chief Complaint  Patient presents with  . Weakness  . Leg Swelling    and abdominal swelling-     HPI Patient has a history of metastatic breast cancer with malignant ascites and recurrent blood loss anemia. The patient was recently admitted to the hospital and was released just 3 days ago. The patient was admitted at that time for anemia, weakness associated with her cancer, and ascites requiring therapeutic paracentesis. Patient states ever since she left the hospital she has become progressively weak again. She cannot even get out of bed anymore. Her abdomen feels more swollen again.  She denies any fevers or vomiting. She denies any diarrhea. She has not noticed any more blood in her stool. She denies any chest pain or shortness of breath or Past Medical History  Diagnosis Date  . Thyroid disease IN 1982    HX IODINE-131 ABLATION FOR HYPERTHYROIDISM  . Osteoporosis   . Vitamin D insufficiency   . Chronic fatigue   . Depression   . Anxiety   . Insomnia   . History of chemotherapy     Adriamycin/cytoxan followed by [facility]  08/2000/tamoxifen 06/2001-02/2003,then arimidex 02/2003-08/2009  . 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 History of radiation therapy 12/28/00-02/12/01    left breast  With Dr. Jamie Kato  . Hypothyroidism   . Fatigue   . Short of breath on exertion   . Breast cancer, left JUNE OF 2002    LEFT BREAST, ER/PR +, HER 2 -  . 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 Metastatic cancer to liver 09/24/11    CURRENTLY RECEIVING RADIATION  . Radiation 10/15/11-10/28/2011    left proximal leg  . Hydronephrosis, left     SECONDARY TO RETROPERITONEAL METS   Past Surgical History  Procedure Laterality Date  . Cholecystectomy  1970  . Femur im nail  09/24/2011     Procedure: INTRAMEDULLARY (IM) NAIL FEMORAL;  Surgeon: Toni Arthurs, MD;  Location: [facility];  Service: Orthopedics;  Laterality: Left;  FEMURAL NECK RX/ INTERTROCHANTERIC PATHOLOGIC FX  . Bone biopsy  09/25/2011    left medullary canal femur reaming=Metastatic Carcinoma  . Hysteroscopy w/d&c  01-11-2003    REMOVAL POLYP  . Left partial mastectomy / sentinel lymph node bx  08-28-2000    LEFT BREAST CANCER  . Cysto/ left retrograde pyleogram/  left ureteral stent placement  10-30-2011  . Cystoscopy w/ ureteral stent placement Left 07/22/2012    Procedure: CYSTOSCOPY WITH STENT REPLACEMENT;  Surgeon: Marcine Matar, MD;  Location: [facility];  Service: Urology;  Laterality: Left;   Family History  Problem Relation Age of Onset  . Cancer Mother     breast  . Heart attack Father   . CVA Sister   . Dementia Brother    History  Substance Use Topics  . Smoking status: Never Smoker   . Smokeless tobacco: Never Used  . Alcohol Use: No   OB History   Grav Para Term Preterm Abortions TAB SAB Ect Mult Living                 Review of Systems  All other systems reviewed and are negative.    Allergies  Cephalexin; Contrast media; Other; and Sulfa antibiotics  Home Medications   Current  Outpatient Rx  Name  Route  Sig  Dispense  Refill  . ciprofloxacin (CIPRO) 500 MG tablet   Oral   Take 1 tablet (500 mg total) by mouth 2 (two) times daily.   12 tablet   0   . diclofenac sodium (VOLTAREN) 1 % GEL   Topical   Apply 2 g topically 2 (two) times daily as needed (for pain). Apply to knee         . docusate sodium (COLACE) 100 MG capsule   Oral   Take 100 mg by mouth daily.         . feeding supplement (ENSURE COMPLETE) LIQD   Oral   Take 237 mLs by mouth 2 (two) times daily between meals.         Marland Kitchen FLUoxetine (PROZAC) 20 MG capsule   Oral   Take 20 mg by mouth daily at 12 noon.         . fulvestrant (FASLODEX) 250 MG/5ML injection   Intramuscular   Inject 250 mg into the muscle every 30 (thirty) days. One  injection each buttock over 1-2 minutes. Warm prior to use.         Marland Kitchen HYDROcodone-acetaminophen (NORCO/VICODIN) 5-325 MG per tablet   Oral   Take 1 tablet by mouth every 6 (six) hours as needed for pain.   40 tablet   0   . hydrocortisone (ANUSOL-HC) 25 MG suppository   Rectal   Place 1 suppository (25 mg total) rectally 2 (two) times daily.   12 suppository   0   . hydrocortisone-pramoxine (ANALPRAM-HC) 2.5-1 % rectal cream   Rectal   Place rectally 3 (three) times daily.   30 g   0   . HYDROmorphone (DILAUDID) 2 MG tablet   Oral   Take 1 tablet (2 mg total) by mouth every 4 (four) hours as needed for pain.   10 tablet   0   . levothyroxine (SYNTHROID, LEVOTHROID) 75 MCG tablet   Oral   Take 75 mcg by mouth every morning.          . lidocaine-prilocaine (EMLA) cream   Topical   Apply topically as needed. Apply to PortaCath site as directed 2 hours prior to access.   30 g   1   . LORazepam (ATIVAN) 1 MG tablet   Oral   Take 1 tablet (1 mg total) by mouth every 6 (six) hours as needed (for nausea or to relax).   40 tablet   1   . ondansetron (ZOFRAN-ODT) 8 MG disintegrating tablet   Oral   Take 8 mg by mouth every 12 (twelve) hours as needed for nausea.         . pantoprazole (PROTONIX) 40 MG tablet   Oral   Take 40 mg by mouth daily.          . polyethylene glycol powder (MIRALAX) powder   Oral   Take 17 g by mouth as needed.         . sodium chloride (OCEAN) 0.65 % nasal spray   Nasal   Place 1 spray into the nose as needed (to keep nasal passages moist). Use every 1-2 hours while awake to keep nasal passages moist.   30 mL   12   . sucralfate (CARAFATE) 1 G tablet   Oral   Take 1 tablet (1 g total) by mouth 4 (four) times daily -  before meals and at bedtime. Can dissolve in water, for stomach  60 tablet   0   . temazepam (RESTORIL) 15 MG capsule   Oral   Take 1 capsule (15 mg total) by mouth at bedtime as needed for sleep.   30  capsule   0   . zolendronic acid (ZOMETA) 4 MG/5ML injection   Intravenous   Inject 4 mg into the vein once.         Marland Kitchen zolpidem (AMBIEN) 5 MG tablet   Oral   Take 1 tablet (5 mg total) by mouth at bedtime as needed for sleep.   30 tablet   0   . Simethicone (GAS-X PO)   Oral   Take 1 tablet by mouth daily as needed (for gas).           BP 96/54  Pulse 104  Temp(Src) 98.9 F (37.2 C) (Oral)  Resp 25  SpO2 97% Physical Exam  Nursing note and vitals reviewed. Constitutional:  Frail and debilitated  HENT:  Head: Normocephalic and atraumatic.  Right Ear: External ear normal.  Left Ear: External ear normal.  Eyes: Conjunctivae are normal. Right eye exhibits no discharge. Left eye exhibits no discharge. No scleral icterus.  Neck: Neck supple. No tracheal deviation present.  Cardiovascular: Normal rate, regular rhythm and intact distal pulses.   Pulmonary/Chest: Effort normal and breath sounds normal. No stridor. No respiratory distress. She has no wheezes. She has no rales.  Port-A-Cath right chest wall, no erythema  Abdominal: Soft. Bowel sounds are normal. She exhibits ascites. She exhibits no distension, no abdominal bruit and no pulsatile midline mass. There is no tenderness. There is no rebound and no guarding.  Musculoskeletal: She exhibits edema. She exhibits no tenderness.  Neurological: She is alert. No sensory deficit. Cranial nerve deficit:  no gross defecits noted. She exhibits normal muscle tone. She displays no seizure activity. Coordination normal.  Generalized weakness in all 4 extremities patient is unable to sit up in bed on her own  Skin: Skin is warm and dry. No rash noted. She is not diaphoretic. There is pallor.  Psychiatric: She has a normal mood and affect.    ED Course  EKG Sinus tachycardia rate 110 Normal axis, normal intervals Normal ST T waves Procedures (including critical care time) Medications  0.9 %  sodium chloride infusion (1,000 mLs  Intravenous New Bag/Given 10/25/12 0640)  0.9 %  sodium chloride infusion (not administered)    Followed by  0.9 %  sodium chloride infusion (not administered)  ciprofloxacin (CIPRO) IVPB 400 mg (400 mg Intravenous New Bag/Given 10/25/12 0655)    Labs Reviewed  APTT - Abnormal; Notable for the following:    aPTT 45 (*)    All other components within normal limits  CBC WITH DIFFERENTIAL - Abnormal; Notable for the following:    WBC 14.4 (*)    RBC 3.54 (*)    Hemoglobin 11.6 (*)    HCT 34.2 (*)    RDW 22.4 (*)    Platelets 119 (*)    Neutro Abs 9.6 (*)    Monocytes Absolute 1.7 (*)    All other components within normal limits  COMPREHENSIVE METABOLIC PANEL - Abnormal; Notable for the following:    Sodium 129 (*)    Glucose, Bld 116 (*)    Calcium 10.6 (*)    Total Protein 4.6 (*)    Albumin 2.0 (*)    AST 120 (*)    Alkaline Phosphatase 171 (*)    Total Bilirubin 5.7 (*)    GFR  calc non Af Amer 52 (*)    GFR calc Af Amer 60 (*)    All other components within normal limits  PROTIME-INR - Abnormal; Notable for the following:    Prothrombin Time 22.7 (*)    INR 2.08 (*)    All other components within normal limits  URINALYSIS, ROUTINE W REFLEX MICROSCOPIC - Abnormal; Notable for the following:    Color, Urine RED (*)    APPearance TURBID (*)    Hgb urine dipstick LARGE (*)    Bilirubin Urine LARGE (*)    Protein, ur 100 (*)    Urobilinogen, UA 2.0 (*)    Nitrite POSITIVE (*)    Leukocytes, UA LARGE (*)    All other components within normal limits  AMMONIA - Abnormal; Notable for the following:    Ammonia 61 (*)    All other components within normal limits  URINE MICROSCOPIC-ADD ON - Abnormal; Notable for the following:    Bacteria, UA FEW (*)    Crystals CA OXALATE CRYSTALS (*)    All other components within normal limits  URINE CULTURE  LACTIC ACID, PLASMA  TROPONIN I  TYPE AND SCREEN   Dg Chest Port 1 View  10/25/2012   *RADIOLOGY REPORT*  Clinical Data: Weakness   PORTABLE CHEST - 1 VIEW  Comparison: Prior radiograph from 10/18/2012  Findings: Right-sided Port-A-Cath is unchanged.  Cardiac and mediastinal silhouettes are stable in size and contour, and remain within normal limits. Multiple surgical clips overlie the left axilla, unchanged.  The lungs are mildly hypoinflated. Bibasilar linear opacities are similar to prior exam, and likely represent atelectasis or scarring.  No acute infiltrate or pulmonary edema is identified. Remote fracture the right seventh rib is again noted.  No pneumothorax.  IMPRESSION: Stable exam with no acute cardiopulmonary process identified.   Original Report Authenticated By: Rise Mu, M.D.   1. Weakness   2. UTI (lower urinary tract infection)     MDM  Pt with worsening weakness.  She has multiple medical problems.  Anemia appears to be stable.  No sign of renal failure.  Increasing lfts.   UTI appears to be persistent.  May be contributing to her weakness.  Will consult with hospitalist regarding admission.  Pt may end up requiring more care than can be provided at her home.  Celene Kras, MD 10/25/12 [phone]

## 2012-10-25 NOTE — Care Management Note (Addendum)
    Page 1 of 2   10/29/2012     3:02:02 PM   CARE MANAGEMENT NOTE 10/29/2012  Patient:  Kelli [name],Kelli Kelli [name]   Account Number:  [id number]  Date Initiated:  10/25/2012  Documentation initiated by:  [facility]  Subjective/Objective Assessment:   ADMITTED W/UTI.READMIT 7/28-10/22/12-LOWER GIB.ZO:XWRUEAVW.     Action/Plan:   FROM HOME W/SPOUSE.ACTIVE W/AHC-HHRN/PT/OT.   Anticipated DC Date:  10/29/2012   Anticipated DC Plan:  HOSPICE MEDICAL FACILITY      DC Planning Services  CM consult      Choice offered to / List presented to:  C-1 Patient           Status of service:  Completed, signed off Medicare Important Message given?   (If response is "NO", the following Medicare IM given date fields will be blank) Date Medicare IM given:   Date Additional Medicare IM given:    Discharge Disposition:  HOSPICE MEDICAL FACILITY  Per UR Regulation:  Reviewed for med. necessity/level of care/duration of stay  If discussed at Long Length of Stay Meetings, dates discussed:    Comments:  10/29/12 Kelli Dieguez RN,BSN NCM [phone] FOR PARACENTESIS TODAY,THEN POST PROCEDURE FOR D/C TO HOSPICE OF Kelli [name].  10/28/12 Kelli Kashani RN,BSN NCM [phone] FOR PERMANENT PARACENTESIS CATH.SPOUSE UNABLE TO ASST W/DRAIN @HOME ,THEREFORE SINCE MEETS FOR RESIDENTIAL HOSPICE THAT IS THE CURRENT D/C PLAN.SW AWARE & FOLLOWING.CM FOLLOWING IF PLAN CHANGES TO HOME W/HOSPICE SERVICES. EVAL FOR PERMANENT PARACENTESIS CATH.HOSPICE & PALLIATIVE OF Kelli Kelli [name] FOLLOWING.WILL FAX D/C SUMMARY,AVS TO#[phone] ONCE RECEIVED.  10/27/12 Kelli Toppins RN,BSN NCM [phone] REFERRAL FOR CHOICE OF  HOME HOSPICE PROVIDERS.HOSPICE & PALLIATIVE CARE OF Kelli [name] CHOSEN BY PATIENT/SPOUSE.TC TEL#[phone]/FAX#[phone] SPOKE TO Kelli [name] ABOUT NEW REFERRAL.THEY ALREADY HAD DEMOGRAPHICS SINCE SHE WAS REFERRED TO THEIR RESIDENTAL HOSPICE. DME NEEDED IS HOSPITAL BED,3N1,THEY WILL CONTACT CHOICE MEDICAL,PROVIDED THEM W/SPOUSE  CONTACTINFO:Kelli Kelli [name] [phone] FOR ARRANGEMENTS FOR DME.LIKELY FOR D/C HOME TOMORROW.TO BE ASSESSED FOR PERMANENT PARACENTESIS CATH IN AM.SPOUSE ABLE TO PROVIDE OWN TRANSP.WILL FAX D/C SUMMARY ONCE PROVIDED.  10/25/12 Kelli Cosio RN,BSN NCM [phone] PT/OT ORDERED, AWAIT RECOMMENDATIONS.

## 2012-10-26 ENCOUNTER — Inpatient Hospital Stay (HOSPITAL_COMMUNITY): Payer: Medicare Other

## 2012-10-26 ENCOUNTER — Encounter (HOSPITAL_COMMUNITY): Payer: Self-pay | Admitting: *Deleted

## 2012-10-26 DIAGNOSIS — C50919 Malignant neoplasm of unspecified site of unspecified female breast: Secondary | ICD-10-CM

## 2012-10-26 MED ORDER — LACTULOSE 10 GM/15ML PO SOLN
30.0000 g | Freq: Every day | ORAL | Status: DC | PRN
  Administered 2012-10-27: 30 g via ORAL
  Filled 2012-10-26: qty 45

## 2012-10-26 MED ORDER — PANTOPRAZOLE SODIUM 40 MG PO TBEC
40.0000 mg | DELAYED_RELEASE_TABLET | Freq: Two times a day (BID) | ORAL | Status: DC
  Administered 2012-10-26 – 2012-10-29 (×6): 40 mg via ORAL
  Filled 2012-10-26 (×9): qty 1

## 2012-10-26 MED ORDER — OXYCODONE HCL 5 MG PO TABS
5.0000 mg | ORAL_TABLET | ORAL | Status: DC | PRN
  Administered 2012-10-26 – 2012-10-27 (×3): 10 mg via ORAL
  Administered 2012-10-28: 5 mg via ORAL
  Administered 2012-10-28 – 2012-10-30 (×4): 10 mg via ORAL
  Filled 2012-10-26 (×7): qty 2
  Filled 2012-10-26: qty 1

## 2012-10-26 MED ORDER — ALUM & MAG HYDROXIDE-SIMETH 200-200-20 MG/5ML PO SUSP
30.0000 mL | ORAL | Status: DC | PRN
  Administered 2012-10-26: 30 mL via ORAL
  Filled 2012-10-26: qty 30

## 2012-10-26 MED ORDER — LACTULOSE 10 GM/15ML PO SOLN: 20.0000 g | Freq: Every day | ORAL | Status: DC

## 2012-10-26 NOTE — Progress Notes (Signed)
OT Cancellation Note  Patient Details Name: Kelli [name] MRN: [id number] DOB: Mar 04, 1941   Cancelled Treatment:    Reason Eval/Treat Not Completed: Other (comment) Noted plan is for residential hospice unit.  Will defer OT eval.  Khamya Topp 10/26/2012, 3:51 PM Marica Otter, OTR/L [phone] 10/26/2012

## 2012-10-26 NOTE — Progress Notes (Signed)
PT Cancellation Note  Patient Details Name: Kelli [name] MRN: [id number] DOB: 02-07-1941   Cancelled Treatment:    Reason Eval/Treat Not Completed: Other (comment) (note pt d/c plan residential hospice.will not initiate PT)   Donnetta Hail 10/26/2012, 2:13 PM

## 2012-10-26 NOTE — Procedures (Signed)
Successful US guided paracentesis from RLQ.  Yielded 4.5L of cloudy yellow fluid.  No immediate complications.  Pt tolerated well.   Specimen was not sent for labs.  Brayton El PA-C 10/26/2012 12:28 PM

## 2012-10-26 NOTE — Progress Notes (Signed)
CSW spoke with [facility] hosp[facility] home. They will be able to take patient in the AM. Discharge summary can be faxed to [phone], number for the nurse to call report is [phone]. Athleen Feltner C. Kail Fraley MSW, LCSW [phone]

## 2012-10-26 NOTE — Progress Notes (Signed)
Progress Note from the Palliative Medicine Team at [facility]  Subjective: patient is alert and oriented, husband and two sons at bedside  -continued conversation regarding GOC and options, discussion regarding limited prognosis and anticipatory care needs  -all agree that home is not a viable option and are hopeful for residential hospice     Objective: Allergies  Allergen Reactions  . Cephalexin Nausea Only  . Contrast Media (Iodinated Diagnostic Agents) Rash    GIVE BENADRYL PRIOR  . Other Nausea And Vomiting and Rash    "MYCIN" DRUGS  . Sulfa Antibiotics Nausea And Vomiting and Rash   Scheduled Meds: . feeding supplement  237 mL Oral BID BM  . FLUoxetine  20 mg Oral Q1200  . hydrocortisone  25 mg Rectal BID  . hydrocortisone-pramoxine   Rectal TID  . lactulose  30 g Oral TID  . levothyroxine  75 mcg Oral QAC breakfast  . pantoprazole  40 mg Oral Daily  . piperacillin-tazobactam (ZOSYN)  IV  3.375 g Intravenous Q8H  . sucralfate  1 g Oral TID AC & HS   Continuous Infusions:  PRN Meds:.diclofenac sodium, LORazepam, ondansetron (ZOFRAN) IV, ondansetron, ondansetron, oxyCODONE, simethicone, sodium chloride, temazepam  BP 104/54  Pulse 105  Temp(Src) 98.4 F (36.9 C) (Oral)  Resp 20  Ht 5\' 5"  (1.651 m)  Wt 73.1 kg (161 lb 2.5 oz)  BMI 26.82 kg/m2  SpO2 96%   PPS:30 %  Pain Score:denies presently  Pain Location abdomin   Intake/Output Summary (Last 24 hours) at 10/26/12 0834 Last data filed at 10/26/12 0500  Gross per 24 hour  Intake    800 ml  Output      0 ml  Net    800 ml      LBM: 10-26-12    Stool Softner: lactulose  Physical Exam:  General: Chronically ill appearing, NAD  HEENT: Mm, no exudate, + temporal muscle wasting  Chest: Diminished in bases ,CTA  CVS: tachycardic  Abdomen: distended, firm, decreased BS  Ext: +3 BLE edema  Neuro:alert and oriented X3, engaged in conversation regarding her medical situation and her choices       Labs: CBC    Component Value Date/Time   WBC 14.4* 10/25/2012 0600   WBC 13.3* 10/13/2012 1312   RBC 3.54* 10/25/2012 0600   RBC 2.68* 10/13/2012 1312   RBC 3.57* 09/24/2011 1438   HGB 11.6* 10/25/2012 0600   HGB 9.7* 10/13/2012 1312   HCT 34.2* 10/25/2012 0600   HCT 29.3* 10/13/2012 1312   PLT 119* 10/25/2012 0600   PLT 114* 10/13/2012 1312   MCV 96.6 10/25/2012 0600   MCV 109.6* 10/13/2012 1312   MCH 32.8 10/25/2012 0600   MCH 36.4* 10/13/2012 1312   MCHC 33.9 10/25/2012 0600   MCHC 33.2 10/13/2012 1312   RDW 22.4* 10/25/2012 0600   RDW 21.8* 10/13/2012 1312   LYMPHSABS 3.0 10/25/2012 0600   LYMPHSABS 3.9* 10/13/2012 1312   MONOABS 1.7* 10/25/2012 0600   MONOABS 0.9 10/13/2012 1312   EOSABS 0.1 10/25/2012 0600   EOSABS 0.1 10/13/2012 1312   BASOSABS 0.0 10/25/2012 0600   BASOSABS 0.1 10/13/2012 1312    BMET    Component Value Date/Time   NA 129* 10/25/2012 0600   NA 135* 10/13/2012 1312   K 4.8 10/25/2012 0600   K 4.2 10/13/2012 1312   CL 99 10/25/2012 0600   CL 109* 09/07/2012 1420   CO2 23 10/25/2012 0600   CO2 23 10/13/2012 1312  GLUCOSE 116* 10/25/2012 0600   GLUCOSE 162* 10/13/2012 1312   GLUCOSE 148* 09/07/2012 1420   BUN 21 10/25/2012 0600   BUN 13.6 10/13/2012 1312   CREATININE 1.05 10/25/2012 0600   CREATININE 1.1 10/13/2012 1312   CALCIUM 10.6* 10/25/2012 0600   CALCIUM 9.5 10/13/2012 1312   GFRNONAA 52* 10/25/2012 0600   GFRAA 60* 10/25/2012 0600    CMP     Component Value Date/Time   NA 129* 10/25/2012 0600   NA 135* 10/13/2012 1312   K 4.8 10/25/2012 0600   K 4.2 10/13/2012 1312   CL 99 10/25/2012 0600   CL 109* 09/07/2012 1420   CO2 23 10/25/2012 0600   CO2 23 10/13/2012 1312   GLUCOSE 116* 10/25/2012 0600   GLUCOSE 162* 10/13/2012 1312   GLUCOSE 148* 09/07/2012 1420   BUN 21 10/25/2012 0600   BUN 13.6 10/13/2012 1312   CREATININE 1.05 10/25/2012 0600   CREATININE 1.1 10/13/2012 1312   CALCIUM 10.6* 10/25/2012 0600   CALCIUM 9.5 10/13/2012 1312   PROT 4.6* 10/25/2012 0600   PROT 5.3* 10/13/2012 1312    ALBUMIN 2.0* 10/25/2012 0600   ALBUMIN 2.6* 10/13/2012 1312   AST 120* 10/25/2012 0600   AST 85* 10/13/2012 1312   ALT 32 10/25/2012 0600   ALT 17 10/13/2012 1312   ALKPHOS 171* 10/25/2012 0600   ALKPHOS 133 10/13/2012 1312   BILITOT 5.7* 10/25/2012 0600   BILITOT 3.35* 10/13/2012 1312   GFRNONAA 52* 10/25/2012 0600   GFRAA 60* 10/25/2012 0600      Assessment and Plan: 1. Code Status: DNR/DNI-comfort is main focus ofcare 2. Symptom Control:            Anxiety/Agitation: Ativan 1 mg every 6 hrs PO prn            Pain: Oxycodone 5 mg PO every 3 hrs prn           Diarrhea: stop daily lactulose at patient request, prn only  3. Psycho/Social:  Emotional support offered to patient and family.  This has been especially difficult for patient's husband.  It is hard for him to accept the limited prognosis.  He does verbalize he only wants what the patient wants.  4. Disposition:  Hopeful for residential when medically stable, will write for choice.  Patient has verbalized her desire for a calm, peaceful death. Once d/c she wants only comfort and dignity; no re hospitalizations, iv fluids, diagnostics. She desires medications to enhance her comfort.    Time In Time Out Total Time Spent with Patient Total Overall Time  0900 1000 55 min    Greater than 50%  of this time was spent counseling and coordinating care related to the above assessment and plan.  Lorinda Creed NP  Palliative Medicine Team Team Phone # [phone] Pager [phone]  Discussed with Dr Malachi Bonds 1

## 2012-10-26 NOTE — Clinical Social Work Psychosocial (Signed)
     Clinical Social Work Department BRIEF PSYCHOSOCIAL ASSESSMENT 10/26/2012  Patient:  Lavey,Darien R     Account Number:  [id number]     Admit date:  10/25/2012  Clinical Social Worker:  Hattie Perch  Date/Time:  10/26/2012 12:00 M  Referred by:  Physician  Date Referred:  10/26/2012 Referred for  Residential hospice placement   Other Referral:   Interview type:  Family Other interview type:    PSYCHOSOCIAL DATA Living Status:  FAMILY Admitted from facility:   Level of care:   Primary support name:  Yumi Insalaco Primary support relationship to patient:  SPOUSE Degree of support available:   good    CURRENT CONCERNS Current Concerns  Post-Acute Placement   Other Concerns:    SOCIAL WORK ASSESSMENT / PLAN CSW met with patient and spouse at bedside. patient was sleeping peacefully and CSW did not want to disturb her. Patient spouse states that they are interested in residential hospice placement and would like [facility] home as that is the most convenient. patient spouse very quiet and introspective.   Assessment/plan status:   Other assessment/ plan:   Information/referral to community resources:    PATIENTS/FAMILYS RESPONSE TO PLAN OF CARE: spouse quiet and solomn. requesting [facility] home but did not speak much more than that.

## 2012-10-26 NOTE — Progress Notes (Signed)
[facility] PROGRESS NOTE  Kelli [name] ZOX:[id number] DOB: 1940/05/03 DOA: 10/25/2012 PCP: [name], MD  Assessment/Plan  UTI while on antibiotics. Previously being treated for Enterobacter UTI but seems to have progressed on ciprofloxacin  - Zosyn pending culture results (intolerant to cephalosporins)  - f/u urine culture   Borderline hypoTN, possible sepsis, meets SIRS criteria with tachycardia, leukocytosis, improved somewhat with antibiotics and albumin infusion yesterday - Albumin infusion if needed post para if > 5L removed  Hepatic encephalopathy, improved after lactulose yesterday - Continue lactulose prn constipation (or consider scheduling low dose once daily for constipation prevention  Lower extremity swelling, likely due to anasarca.   - elevate legs - Patient for comfort measures only so no duplex at this time.  Breast cancer with metastases to multiple sites including the liver.  I do not think that she would tolerate further chemotherapy given her progressively worsening functional status.   - Will touch base with Oncology - Dr. Welton Flakes - Appreciate Palliative care assistance  Plan for transfer to [facility] at discharge  Falls and deconditioning.  Transfer to inpatient hospice when ready for discharge.   Recent rectal bleeding due to hemorrhoids. Not a surgical candidate.   - lactulose as stool softener - continue anusol and SCDs   Malignant ascites: She underwent a paracentesis during previous admission with removal of 5 L of fluid without complication.  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   Hypothyroidism, TSH. Continue Synthroid daily.   Moderate protein calorie malnutrition. Supplements and regular diet.   No further labs draws.    Diet: regular  Access: Port right chest  IVF: albumin  Proph: SCDs   Code Status: DNR  Family Communication: spoke with patient and her  husband and extended family Disposition Plan: awaiting urine culture as patient would like to continue antibiotics and may have resistant UTI, but may discharge to hospice care likely tomorrow   Consultants:  Oncology - left message for Dr. Welton Flakes  Procedures:  Paracentesis 8/5  Antibiotics:  Zosyn 8/4 >>   HPI/Subjective:  Feels very tired because she was up pooping overnight.  Denies SOB, but has had some reflux/regurgitation of food and is requesting acid medication.  Also would like more pain medication.  Voiding all right.    Objective: Filed Vitals:   10/25/12 1353 10/25/12 1806 10/25/12 2120 10/26/12 0651  BP: 108/56 106/56 114/47 104/54  Pulse: 98 98 100 105  Temp: 97.9 F (36.6 C) 98 F (36.7 C) 97.9 F (36.6 C) 98.4 F (36.9 C)  TempSrc: Oral Oral Oral Oral  Resp:  16 20 20   Height:      Weight:    73.1 kg (161 lb 2.5 oz)  SpO2: 97%  98% 96%    Intake/Output Summary (Last 24 hours) at 10/26/12 1117 Last data filed at 10/26/12 0900  Gross per 24 hour  Intake    800 ml  Output      0 ml  Net    800 ml   Filed Weights   10/25/12 0840 10/26/12 0651  Weight: 74 kg (163 lb 2.3 oz) 73.1 kg (161 lb 2.5 oz)    Exam:   General:  Cachectic CF, No acute distress  HEENT:  NCAT, MMM, decreased scleral icterus  Cardiovascular:  RRR, nl S1, S2 no mrg, 2+ pulses, warm extremities  Respiratory:  CTAB, no increased WOB  Abdomen:  NABS, moderately distended, firmer than yesterday although not tense,  TTP diffusely without rebound or guarding  MSK:   Decreased tone and bulk, 2+ LEE  Neuro:  Grossly intact  Psych:  Faster to answer questions today.  Data Reviewed: Basic Metabolic Panel:  Recent Labs Lab 10/20/12 0525 10/21/12 0416 10/22/12 0330 10/25/12 0600  NA 132* 131* 131* 129*  K 4.0 3.7 4.0 4.8  CL 100 100 99 99  CO2 23 21 21 23   GLUCOSE 148* 154* 127* 116*  BUN 30* 27* 25* 21  CREATININE 1.37* 1.10 1.05 1.05  CALCIUM 9.5 9.6 10.3 10.6*    Liver Function Tests:  Recent Labs Lab 10/25/12 0600  AST 120*  ALT 32  ALKPHOS 171*  BILITOT 5.7*  PROT 4.6*  ALBUMIN 2.0*   No results found for this basename: LIPASE, AMYLASE,  in the last 168 hours  Recent Labs Lab 10/25/12 0600  AMMONIA 61*   CBC:  Recent Labs Lab 10/20/12 0805 10/21/12 0416 10/22/12 0330 10/25/12 0600  WBC 11.7* 10.4 12.7* 14.4*  NEUTROABS  --   --   --  9.6*  HGB 12.8 11.7* 12.9 11.6*  HCT 37.9 34.7* 38.1 34.2*  MCV 94.8 96.4 96.0 96.6  PLT 129* 115* 124* 119*   Cardiac Enzymes:  Recent Labs Lab 10/25/12 0600  TROPONINI <0.30   BNP (last 3 results) No results found for this basename: PROBNP,  in the last 8760 hours CBG:  Recent Labs Lab 10/20/12 0734 10/21/12 0655 10/22/12 0728  GLUCAP 136* 149* 138*    Recent Results (from the past 240 hour(s))  URINE CULTURE     Status: None   Collection Time    10/18/12  6:09 PM      Result Value Range Status   Specimen Description URINE, CLEAN CATCH   Final   Special Requests NONE   Final   Culture  Setup Time 10/18/2012 22:25   Final   Colony Count >=100,000 COLONIES/ML   Final   Culture ENTEROBACTER CLOACAE   Final   Report Status 10/20/2012 FINAL   Final   Organism ID, Bacteria ENTEROBACTER CLOACAE   Final  MRSA PCR SCREENING     Status: None   Collection Time    10/18/12  7:10 PM      Result Value Range Status   MRSA by PCR NEGATIVE  NEGATIVE Final   Comment:            The GeneXpert MRSA Assay (FDA     approved for NASAL specimens     only), is one component of a     comprehensive MRSA colonization     surveillance program. It is not     intended to diagnose MRSA     infection nor to guide or     monitor treatment for     MRSA infections.  URINE CULTURE     Status: None   Collection Time    10/25/12  5:20 AM      Result Value Range Status   Specimen Description URINE, CLEAN CATCH   Final   Special Requests NONE   Final   Culture  Setup Time     Final   Value:  10/25/2012 10:04     Performed at [facility] Count PENDING   Incomplete   Culture     Final   Value: Culture reincubated for better growth     Performed at [facility]   Report Status PENDING   Incomplete     Studies:  Dg Chest Port 1 View  10/25/2012   *RADIOLOGY REPORT*  Clinical Data: Weakness  PORTABLE CHEST - 1 VIEW  Comparison: Prior radiograph from 10/18/2012  Findings: Right-sided Port-A-Cath is unchanged.  Cardiac and mediastinal silhouettes are stable in size and contour, and remain within normal limits. Multiple surgical clips overlie the left axilla, unchanged.  The lungs are mildly hypoinflated. Bibasilar linear opacities are similar to prior exam, and likely represent atelectasis or scarring.  No acute infiltrate or pulmonary edema is identified. Remote fracture the right seventh rib is again noted.  No pneumothorax.  IMPRESSION: Stable exam with no acute cardiopulmonary process identified.   Original Report Authenticated By: Rise Mu, M.D.    Scheduled Meds: . feeding supplement  237 mL Oral BID BM  . FLUoxetine  20 mg Oral Q1200  . hydrocortisone  25 mg Rectal BID  . hydrocortisone-pramoxine   Rectal TID  . levothyroxine  75 mcg Oral QAC breakfast  . pantoprazole  40 mg Oral BID  . piperacillin-tazobactam (ZOSYN)  IV  3.375 g Intravenous Q8H  . sucralfate  1 g Oral TID AC & HS   Continuous Infusions:   Principal Problem:   UTI (urinary tract infection) Active Problems:   Breast cancer   Anemia   Hypothyroidism   Lower GI bleed   Coagulopathy   Cirrhosis of liver   Recurrent falls   Encephalopathy, hepatic   Lower extremity edema   Ascites   Palliative care encounter   Pain, generalized   Weakness generalized    Time spent: 30 min    Jacalyn Biggs, [facility]  [facility] Pager [phone]. If 7PM-7AM, please contact night-coverage at [email or web address], password [facility] 10/26/2012, 11:17 AM  LOS: 1 day

## 2012-10-27 DIAGNOSIS — D649 Anemia, unspecified: Secondary | ICD-10-CM

## 2012-10-27 DIAGNOSIS — D62 Acute posthemorrhagic anemia: Secondary | ICD-10-CM

## 2012-10-27 MED ORDER — VANCOMYCIN HCL 10 G IV SOLR
1250.0000 mg | INTRAVENOUS | Status: DC
  Administered 2012-10-28 – 2012-10-29 (×2): 1250 mg via INTRAVENOUS
  Filled 2012-10-27 (×3): qty 1250

## 2012-10-27 MED ORDER — VANCOMYCIN HCL 10 G IV SOLR
1500.0000 mg | Freq: Once | INTRAVENOUS | Status: AC
  Administered 2012-10-27: 1500 mg via INTRAVENOUS
  Filled 2012-10-27: qty 1500

## 2012-10-27 NOTE — Progress Notes (Signed)
Progress Note from the Palliative Medicine Team at [facility]  Subjective:  Patient is alert and oriented X3, husband and son at bedsdie  -all are concerned with transition to residential hospice this morning because they want further consideration for permanent abdominal tap for paracentesis (risks and benefits discussed)     Objective: Allergies  Allergen Reactions  . Cephalexin Nausea Only  . Contrast Media (Iodinated Diagnostic Agents) Rash    GIVE BENADRYL PRIOR  . Other Nausea And Vomiting and Rash    "MYCIN" DRUGS  . Sulfa Antibiotics Nausea And Vomiting and Rash   Scheduled Meds: . feeding supplement  237 mL Oral BID BM  . FLUoxetine  20 mg Oral Q1200  . hydrocortisone  25 mg Rectal BID  . hydrocortisone-pramoxine   Rectal TID  . levothyroxine  75 mcg Oral QAC breakfast  . pantoprazole  40 mg Oral BID  . piperacillin-tazobactam (ZOSYN)  IV  3.375 g Intravenous Q8H  . sucralfate  1 g Oral TID AC & HS   Continuous Infusions:  PRN Meds:.alum & mag hydroxide-simeth, diclofenac sodium, lactulose, LORazepam, ondansetron (ZOFRAN) IV, ondansetron, ondansetron, oxyCODONE, simethicone, sodium chloride, temazepam  BP 109/48  Pulse 107  Temp(Src) 98.4 F (36.9 C) (Oral)  Resp 18  Ht 5\' 5"  (1.651 m)  Wt 73.2 kg (161 lb 6 oz)  BMI 26.85 kg/m2  SpO2 95%   PPS:30 %  Pain Score:denies   Intake/Output Summary (Last 24 hours) at 10/27/12 0920 Last data filed at 10/27/12 0320  Gross per 24 hour  Intake    631 ml  Output      0 ml  Net    631 ml      LBM: 10-26-12 per patient     Physical Exam:  General: Chronically ill appearing, NAD  HEENT: Mm, no exudate, + temporal muscle wasting  Chest: Diminished in bases ,CTA  CVS: tachycardic  Abdomen: remaines distended and  Firm even after yesterdays paracentesis, decreased BS  Ext: +3 BLE edema  Neuro:alert and oriented X3, engaged in conversation regarding her medical situation and her choices     Labs: CBC     Component Value Date/Time   WBC 14.4* 10/25/2012 0600   WBC 13.3* 10/13/2012 1312   RBC 3.54* 10/25/2012 0600   RBC 2.68* 10/13/2012 1312   RBC 3.57* 09/24/2011 1438   HGB 11.6* 10/25/2012 0600   HGB 9.7* 10/13/2012 1312   HCT 34.2* 10/25/2012 0600   HCT 29.3* 10/13/2012 1312   PLT 119* 10/25/2012 0600   PLT 114* 10/13/2012 1312   MCV 96.6 10/25/2012 0600   MCV 109.6* 10/13/2012 1312   MCH 32.8 10/25/2012 0600   MCH 36.4* 10/13/2012 1312   MCHC 33.9 10/25/2012 0600   MCHC 33.2 10/13/2012 1312   RDW 22.4* 10/25/2012 0600   RDW 21.8* 10/13/2012 1312   LYMPHSABS 3.0 10/25/2012 0600   LYMPHSABS 3.9* 10/13/2012 1312   MONOABS 1.7* 10/25/2012 0600   MONOABS 0.9 10/13/2012 1312   EOSABS 0.1 10/25/2012 0600   EOSABS 0.1 10/13/2012 1312   BASOSABS 0.0 10/25/2012 0600   BASOSABS 0.1 10/13/2012 1312    BMET    Component Value Date/Time   NA 129* 10/25/2012 0600   NA 135* 10/13/2012 1312   K 4.8 10/25/2012 0600   K 4.2 10/13/2012 1312   CL 99 10/25/2012 0600   CL 109* 09/07/2012 1420   CO2 23 10/25/2012 0600   CO2 23 10/13/2012 1312   GLUCOSE 116* 10/25/2012 0600  GLUCOSE 162* 10/13/2012 1312   GLUCOSE 148* 09/07/2012 1420   BUN 21 10/25/2012 0600   BUN 13.6 10/13/2012 1312   CREATININE 1.05 10/25/2012 0600   CREATININE 1.1 10/13/2012 1312   CALCIUM 10.6* 10/25/2012 0600   CALCIUM 9.5 10/13/2012 1312   GFRNONAA 52* 10/25/2012 0600   GFRAA 60* 10/25/2012 0600    CMP     Component Value Date/Time   NA 129* 10/25/2012 0600   NA 135* 10/13/2012 1312   K 4.8 10/25/2012 0600   K 4.2 10/13/2012 1312   CL 99 10/25/2012 0600   CL 109* 09/07/2012 1420   CO2 23 10/25/2012 0600   CO2 23 10/13/2012 1312   GLUCOSE 116* 10/25/2012 0600   GLUCOSE 162* 10/13/2012 1312   GLUCOSE 148* 09/07/2012 1420   BUN 21 10/25/2012 0600   BUN 13.6 10/13/2012 1312   CREATININE 1.05 10/25/2012 0600   CREATININE 1.1 10/13/2012 1312   CALCIUM 10.6* 10/25/2012 0600   CALCIUM 9.5 10/13/2012 1312   PROT 4.6* 10/25/2012 0600   PROT 5.3* 10/13/2012 1312   ALBUMIN 2.0* 10/25/2012 0600    ALBUMIN 2.6* 10/13/2012 1312   AST 120* 10/25/2012 0600   AST 85* 10/13/2012 1312   ALT 32 10/25/2012 0600   ALT 17 10/13/2012 1312   ALKPHOS 171* 10/25/2012 0600   ALKPHOS 133 10/13/2012 1312   BILITOT 5.7* 10/25/2012 0600   BILITOT 3.35* 10/13/2012 1312   GFRNONAA 52* 10/25/2012 0600   GFRAA 60* 10/25/2012 0600      Assessment and Plan: 1. Code Status:DNR/DNI-comfort is main focus of care 2. Symptom Control:       Ascites: evaluation in am for permament paracentesis tap to remove continued fluid buidup prior to                             transition home         Anxiety/Agitation: Ativan 1 mg every 6 hrs PO prn         Pain: Oxycodone 5 mg PO every 3 hrs prn          Diarrhea:  lactulose , prn only  3. Psycho/Social: Emotional support offered to patient and family.   Her husband is having difficulty accepting the liminited  care options   4. Disposition: Hopeful for discharge home with hospice services , requesting [facility]    Time In Time Out Total Time Spent with Patient Total Overall Time  0845 1000 70 min 75 min    Greater than 50%  of this time was spent counseling and coordinating care related to the above assessment and plan.  Lorinda Creed NP  Palliative Medicine Team Team Phone # [phone] Pager [phone]  Discussed with Dr Myers/Bethany LCSW 1

## 2012-10-27 NOTE — Progress Notes (Signed)
Patient ID: Kelli [name], female   DOB: [birth year], 72 y.o.   MRN: [id number] [facility] PROGRESS NOTE  Kelli [name] JXB:[id number] DOB: 1941-03-18 DOA: 10/25/2012 PCP: Kelli Mask, MD  Brief narrative: The patient is a 72 y.o. year-old female with history of metastatic T1N3 invasive left breast cancer with extension to 13 nodes including extracapsular extension, ER + 67%, PR 39%, HER-2/neu negative s/p lumpectomy, 18 node axillary dissection, adjuvant adriamycin/cytoxan followed by [facility] chemotherapy and local radiation. She has mets to liver, bone, peritoneum, retroperitoneal space. 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 She underwent paracentesis and was hospitalized for several days to recover from acute kidney injury. She was followed by oncology. She was discharged home and became progressively weaker. She had a fall at home and her husband was not able to lift her. She has been confused and had increased abdominal and lower extremity swelling. She has not had a BM since last discharge which was 2 days prior to this admission.  In the ER, labs were notable for sodium 129, creatinine 1.05, calcium 10.6, albumin 2, AST 120, ALT 32, total bilirubin 5.7, ammonia level 61, INR 2, WBC 14.4, hgb 11.6, plt 119, UA with persistent elevated WBC and now positive for nitrite as well as LE.  Principal Problem:   UTI (urinary tract infection) - Previously being treated for Enterobacter UTI but seems to have progressed on ciprofloxacin  - currently on Zosyn - urine culture preliminary report with 60 K of staph species, final report pending - will change ABX to Vanc Active Problems:   Hypotension - BP soft but overall stable - albumin was given with paracentesis    Breast cancer - was Dr. Darrold Span pt but care transferred to Dr.  Milta Deiters care - will notify her of pt's admission and plan for transition to comfort care   Hypothyroidism - continue Synthroid    Lower GI bleed secondary to hemorrhoids - no acute bleeding evident by attending nurse or family member - no further blood draws requested to ensure pt's comfort - will respect pt and family wishes  - continue as needed bowel regimen    Coagulopathy in the setting of cirrhosis of liver   Severe malnutrition - secondary to progressive failure to thrive in the setting of progressive malignancy - encouraged PO intake as pt able to tolerate as well as nutritional supplementation    Hyponatremia - secondary to volume status - no further blood draws per family and pt request in order to ensure comfort    Encephalopathy, hepatic - continue Lactulose PRN   Lower extremity edema in the setting of malignant ascites - status post paracentesis on 8/5, 4.5 L of fluid removed - LE edema improving  - keep extremities elevated    Palliative care encounter - pt and family wants pt to go home with hospice and plan is for pleurex cath to be placed for symptom relief    Pain, generalized - pt appears to be comfortable this AM, continue current analgesia as needed   Code Status: DNR  Family Communication: spoke with patient and her husband at bedside Disposition Plan: plan for d/c home in AM with hospice   Consultants:  Palliative care Procedures:  Paracentesis 8/5 --> 4.5 liters of ascitic fluid removed successfully  Antibiotics:  Zosyn 8/4 >> 8/6 Vancomycin 8/6 >>  HPI/Subjective: No events overnight.  Objective: Filed Vitals:   10/26/12 1407 10/26/12 2005 10/27/12 0525 10/27/12 1352  BP: 104/47 106/61 109/48 104/43  Pulse: 98 104 107 104  Temp: 97.9 F (36.6 C) 98.3 F (36.8 C) 98.4 F (36.9 C) 98.7 F (37.1 C)  TempSrc:  Oral Oral Oral  Resp: 18 18 18 18   Height:      Weight:   73.2 kg (161 lb 6 oz)   SpO2:  97% 95% 93%    Intake/Output Summary  (Last 24 hours) at 10/27/12 1524 Last data filed at 10/27/12 0320  Gross per 24 hour  Intake    511 ml  Output      0 ml  Net    511 ml    Exam:   General:  Pt is resting, not in acute distress, jaundiced skin   Cardiovascular: Regular rhythm, tachycardic, S1/S2, no murmurs, no rubs, no gallops  Respiratory: Clear to auscultation bilaterally, no wheezing, decreased breath sounds at bases   Abdomen: Soft, non tender, slightly distended, bowel sounds present, no guarding  Extremities: LE pitting edema + 2, pulses DP and PT palpable bilaterally  Neuro: Resting but easy to arouse, follows commands appropriately   Data Reviewed: Basic Metabolic Panel:  Recent Labs Lab 10/21/12 0416 10/22/12 0330 10/25/12 0600  NA 131* 131* 129*  K 3.7 4.0 4.8  CL 100 99 99  CO2 21 21 23   GLUCOSE 154* 127* 116*  BUN 27* 25* 21  CREATININE 1.10 1.05 1.05  CALCIUM 9.6 10.3 10.6*   Liver Function Tests:  Recent Labs Lab 10/25/12 0600  AST 120*  ALT 32  ALKPHOS 171*  BILITOT 5.7*  PROT 4.6*  ALBUMIN 2.0*    Recent Labs Lab 10/25/12 0600  AMMONIA 61*   CBC:  Recent Labs Lab 10/21/12 0416 10/22/12 0330 10/25/12 0600  WBC 10.4 12.7* 14.4*  NEUTROABS  --   --  9.6*  HGB 11.7* 12.9 11.6*  HCT 34.7* 38.1 34.2*  MCV 96.4 96.0 96.6  PLT 115* 124* 119*   Cardiac Enzymes:  Recent Labs Lab 10/25/12 0600  TROPONINI <0.30   CBG:  Recent Labs Lab 10/21/12 0655 10/22/12 0728  GLUCAP 149* 138*    Recent Results (from the past 240 hour(s))  URINE CULTURE     Status: None   Collection Time    10/18/12  6:09 PM      Result Value Range Status   Specimen Description URINE, CLEAN CATCH   Final   Special Requests NONE   Final   Culture  Setup Time 10/18/2012 22:25   Final   Colony Count >=100,000 COLONIES/ML   Final   Culture ENTEROBACTER CLOACAE   Final   Report Status 10/20/2012 FINAL   Final   Organism ID, Bacteria ENTEROBACTER CLOACAE   Final  MRSA PCR SCREENING      Status: None   Collection Time    10/18/12  7:10 PM      Result Value Range Status   MRSA by PCR NEGATIVE  NEGATIVE Final   Comment:            The GeneXpert MRSA Assay (FDA     approved for NASAL specimens     only), is one component of a     comprehensive MRSA colonization     surveillance program. It is not     intended to diagnose MRSA     infection nor to guide or     monitor treatment for  MRSA infections.  URINE CULTURE     Status: None   Collection Time    10/25/12  5:20 AM      Result Value Range Status   Specimen Description URINE, CLEAN CATCH   Final   Special Requests NONE   Final   Culture  Setup Time     Final   Value: 10/25/2012 10:04     Performed at [facility] Count     Final   Value: 60,000 COLONIES/ML     Performed at [facility]   Culture     Final   Value: STAPHYLOCOCCUS SPECIES     Note: RIFAMPIN AND GENTAMICIN SHOULD NOT BE USED AS SINGLE DRUGS FOR TREATMENT OF STAPH INFECTIONS.     Performed at [facility]   Report Status PENDING   Incomplete    Scheduled Meds: . FLUoxetine  20 mg Oral Q1200  . hydrocortisone  25 mg Rectal BID  . hydrocortisone-pramox   Rectal TID  . levothyroxine  75 mcg Oral QAC breakfast  . pantoprazole  40 mg Oral BID  . ZOSYN IV  3.375 g Intravenous Q8H  . sucralfate  1 g Oral TID AC & HS   Continuous Infusions:   Debbora Presto, MD  [facility] Pager [phone]  If 7PM-7AM, please contact night-coverage [email or web address] Password [email or web address] 10/27/2012, 3:24 PM   LOS: 2 days

## 2012-10-27 NOTE — Progress Notes (Signed)
ANTIBIOTIC CONSULT NOTE - INITIAL  Pharmacy Consult for:  Vancomycin Indication:  Staph UTI  Allergies  Allergen Reactions  . Cephalexin Nausea Only  . Contrast Media (Iodinated Diagnostic Agents) Rash    GIVE BENADRYL PRIOR  . Other Nausea And Vomiting and Rash    "MYCIN" DRUGS  . Sulfa Antibiotics Nausea And Vomiting and Rash    Patient Measurements: Height: 5\' 5"  (165.1 cm) Weight: 161 lb 6 oz (73.2 kg) IBW/kg (Calculated) : 57   Vital Signs: Temp: 98.7 F (37.1 C) (08/06 1352) Temp src: Oral (08/06 1352) BP: 104/43 mmHg (08/06 1352) Pulse Rate: 104 (08/06 1352) Intake/Output from previous day: 08/05 0701 - 08/06 0700 In: 871 [P.O.:721; IV Piggyback:150] Out: -      Labs:  Recent Labs  10/25/12 0600  WBC 14.4*  HGB 11.6*  PLT 119*  CREATININE 1.05   Estimated Creatinine Clearance: 49.3 ml/min (by C-G formula based on Cr of 1.05).    Microbiology: Recent Results (from the past 720 hour(s))  URINE CULTURE     Status: None   Collection Time    10/18/12  6:09 PM      Result Value Range Status   Specimen Description URINE, CLEAN CATCH   Final   Special Requests NONE   Final   Culture  Setup Time 10/18/2012 22:25   Final   Colony Count >=100,000 COLONIES/ML   Final   Culture ENTEROBACTER CLOACAE   Final   Report Status 10/20/2012 FINAL   Final   Organism ID, Bacteria ENTEROBACTER CLOACAE   Final  MRSA PCR SCREENING     Status: None   Collection Time    10/18/12  7:10 PM      Result Value Range Status   MRSA by PCR NEGATIVE  NEGATIVE Final   Comment:            The GeneXpert MRSA Assay (FDA     approved for NASAL specimens     only), is one component of a     comprehensive MRSA colonization     surveillance program. It is not     intended to diagnose MRSA     infection nor to guide or     monitor treatment for     MRSA infections.  URINE CULTURE     Status: None   Collection Time    10/25/12  5:20 AM      Result Value Range Status   Specimen  Description URINE, CLEAN CATCH   Final   Special Requests NONE   Final   Culture  Setup Time     Final   Value: 10/25/2012 10:04     Performed at [facility] Count     Final   Value: 60,000 COLONIES/ML     Performed at [facility]   Culture     Final   Value: STAPHYLOCOCCUS SPECIES     Note: RIFAMPIN AND GENTAMICIN SHOULD NOT BE USED AS SINGLE DRUGS FOR TREATMENT OF STAPH INFECTIONS.     Performed at [facility]   Report Status PENDING   Incomplete    Medical History: Past Medical History  Diagnosis Date  . Thyroid disease IN 1982    HX IODINE-131 ABLATION FOR HYPERTHYROIDISM  . Osteoporosis   . Vitamin D insufficiency   . Chronic fatigue   . Depression   . Anxiety   . Insomnia   . History of chemotherapy     Adriamycin/cytoxan followed by  [facility]  08/2000/tamoxifen 06/2001-02/2003,then arimidex 02/2003-08/2009  . 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 History of radiation therapy 12/28/00-02/12/01    left breast  With Dr. Jamie Kato  . Hypothyroidism   . Fatigue   . Short of breath on exertion   . Breast cancer, left JUNE OF 2002    LEFT BREAST, ER/PR +, HER 2 -  . 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 Metastatic cancer to liver 09/24/11    CURRENTLY RECEIVING RADIATION  . Radiation 10/15/11-10/28/2011    left proximal leg  . Hydronephrosis, left     SECONDARY TO RETROPERITONEAL METS    Medications:  Scheduled:  . feeding supplement  237 mL Oral BID BM  . FLUoxetine  20 mg Oral Q1200  . hydrocortisone  25 mg Rectal BID  . hydrocortisone-pramoxine   Rectal TID  . levothyroxine  75 mcg Oral QAC breakfast  . pantoprazole  40 mg Oral BID  . sucralfate  1 g Oral TID AC & HS   Assessment:  Asked to assist with Vancomycin therapy for this 72 year-old female with a urine culture positive for Staphylococcus species.  She was previously treated with Zosyn for an Enterobacter UTI.  Kelli [name] has a history  of metastatic left breast cancer, and was brought to the ER on 10/25/12 due to failure to thrive.  Goals of Therapy:   Vancomycin trough levels 10-15 mcg/ml  Dosage appropriate for renal function  Eradication of infection  Plan:   Vancomycin 1500 mg IV x 1, then 1250 mg IV every 24 hours.  Trough level measurement as needed to guide dosing.  Polo Riley R.Ph. 10/27/2012 4:11 PM      ,

## 2012-10-28 ENCOUNTER — Other Ambulatory Visit: Payer: Medicare Other | Admitting: Lab

## 2012-10-28 ENCOUNTER — Ambulatory Visit: Payer: Medicare Other | Admitting: Adult Health

## 2012-10-28 ENCOUNTER — Ambulatory Visit: Payer: Medicare Other

## 2012-10-28 LAB — URINE CULTURE: Colony Count: 60000

## 2012-10-28 LAB — PROTIME-INR: INR: 2.03 — ABNORMAL HIGH (ref 0.00–1.49)

## 2012-10-28 MED ORDER — PHYTONADIONE 5 MG PO TABS
5.0000 mg | ORAL_TABLET | Freq: Once | ORAL | Status: AC
  Administered 2012-10-28: 5 mg via ORAL
  Filled 2012-10-28 (×2): qty 1

## 2012-10-28 NOTE — Progress Notes (Signed)
Patient ID: Kelli [name], female   DOB: [birth year], 72 y.o.   MRN: [id number] [facility] PROGRESS NOTE  Etrulia Zarr Criss JYN:[id number] DOB: 25-Dec-1940 DOA: 10/25/2012 PCP: [name], MD  Brief narrative:  The patient is a 72 y.o. year-old female with history of metastatic T1N3 invasive left breast cancer with extension to 13 nodes including extracapsular extension, ER + 67%, PR 39%, HER-2/neu negative s/p lumpectomy, 18 node axillary dissection, adjuvant adriamycin/cytoxan followed by [facility] chemotherapy and local radiation. She has mets to liver, bone, peritoneum, retroperitoneal space. 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 She underwent paracentesis and was hospitalized for several days to recover from acute kidney injury. She was followed by oncology. She was discharged home and became progressively weaker. She had a fall at home and her husband was not able to lift her. She has been confused and had increased abdominal and lower extremity swelling. She has not had a BM since last discharge which was 2 days prior to this admission.   In the ER, labs were notable for sodium 129, creatinine 1.05, calcium 10.6, albumin 2, AST 120, ALT 32, total bilirubin 5.7, ammonia level 61, INR 2, WBC 14.4, hgb 11.6, plt 119, UA with persistent elevated WBC and now positive for nitrite as well as LE.   Principal Problem:  UTI (urinary tract infection)  - Previously being treated for Enterobacter UTI but seems to have progressed on ciprofloxacin  - Prelim cultures positive for coag-negative staph - Continue vancomycin day 2 and plan to transition to by mouth in the morning Active Problems:  Hypotension  - BP soft but overall stable  - albumin was given with paracentesis  Breast cancer  - was Dr. Darrold Span pt but care transferred to Dr.  Milta Deiters care  - will notify her of pt's admission and plan for transition to comfort care  Hypothyroidism  - continue Synthroid  Lower GI bleed secondary to hemorrhoids  - no acute bleeding evident by attending nurse or family member  - no further blood draws requested to ensure pt's comfort  - will respect pt and family wishes  - continue as needed bowel regimen  Coagulopathy in the setting of cirrhosis of liver  - With recurrent and rapidly reaccumulating ascites - Plan on drain placement in the morning - Will obtain PT/ INR and give one dose of vitamin K - Repeat PT/INR in the morning Severe malnutrition  - secondary to progressive failure to thrive in the setting of progressive malignancy  - encouraged PO intake as pt able to tolerate as well as nutritional supplementation  Hyponatremia  - secondary to volume status  - no further blood draws per family and pt request in order to ensure comfort  Encephalopathy, hepatic  - continue Lactulose PRN  Lower extremity edema in the setting of malignant ascites  - status post paracentesis on 8/5, 4.5 L of fluid removed  - LE edema improving  - keep extremities elevated  Palliative care encounter  - pt and family wants pt to go home with hospice and plan is for pleurex cath to be placed for symptom relief  Pain, generalized  - pt appears to be comfortable this AM, continue current analgesia as needed   Code Status: DNR  Family Communication: spoke with patient and her husband at bedside  Disposition Plan: plan for d/c home in AM with hospice  Consultants:  Palliative care Procedures:  Paracentesis 8/5 --> 4.5 liters of ascitic fluid removed successfully  Antibiotics:  Zosyn 8/4 >> 8/6  Vancomycin 8/6 >>  HPI/Subjective: No events overnight.   Objective: Filed Vitals:   10/27/12 2216 10/28/12 0437 10/28/12 1500 10/28/12 1559  BP: 111/56 115/57 109/43 113/69  Pulse: 105 110 103   Temp: 98.2 F (36.8 C) 98.4 F (36.9 C)  98.2 F (36.8 C)   TempSrc: Oral Oral Oral   Resp: 16 16 18    Height:      Weight:  70.4 kg (155 lb 3.3 oz)    SpO2: 94% 94% 91%     Intake/Output Summary (Last 24 hours) at 10/28/12 1809 Last data filed at 10/28/12 1300  Gross per 24 hour  Intake    720 ml  Output      0 ml  Net    720 ml    Exam:   General:  Pt is alert, follows commands appropriately, not in acute distress, jaundiced skin   Cardiovascular: Regular rate and rhythm, S1/S2, no murmurs, no rubs, no gallops  Respiratory: Clear to auscultation bilaterally, no wheezing, no crackles, no rhonchi  Abdomen: Soft, non tender, distended, bowel sounds present, no guarding  Extremities: +1 bilateral pitting lower extremity edema, pulses DP and PT palpable bilaterally  Neuro: Grossly nonfocal  Data Reviewed: Basic Metabolic Panel:  Recent Labs Lab 10/22/12 0330 10/25/12 0600  NA 131* 129*  K 4.0 4.8  CL 99 99  CO2 21 23  GLUCOSE 127* 116*  BUN 25* 21  CREATININE 1.05 1.05  CALCIUM 10.3 10.6*   Liver Function Tests:  Recent Labs Lab 10/25/12 0600  AST 120*  ALT 32  ALKPHOS 171*  BILITOT 5.7*  PROT 4.6*  ALBUMIN 2.0*   No results found for this basename: LIPASE, AMYLASE,  in the last 168 hours  Recent Labs Lab 10/25/12 0600  AMMONIA 61*   CBC:  Recent Labs Lab 10/22/12 0330 10/25/12 0600  WBC 12.7* 14.4*  NEUTROABS  --  9.6*  HGB 12.9 11.6*  HCT 38.1 34.2*  MCV 96.0 96.6  PLT 124* 119*   Cardiac Enzymes:  Recent Labs Lab 10/25/12 0600  TROPONINI <0.30    No components found with this basename: POCBNP,  CBG:  Recent Labs Lab 10/22/12 0728  GLUCAP 138*    Recent Results (from the past 240 hour(s))  MRSA PCR SCREENING     Status: None   Collection Time    10/18/12  7:10 PM      Result Value Range Status   MRSA by PCR NEGATIVE  NEGATIVE Final   Comment:            The GeneXpert MRSA Assay (FDA     approved for NASAL specimens     only), is one component of a      comprehensive MRSA colonization     surveillance program. It is not     intended to diagnose MRSA     infection nor to guide or     monitor treatment for     MRSA infections.  URINE CULTURE     Status: None   Collection Time    10/25/12  5:20 AM      Result Value Range Status   Specimen Description URINE, CLEAN CATCH   Final   Special Requests NONE   Final   Culture  Setup Time     Final   Value: 10/25/2012 10:04  Performed at [facility] Count     Final   Value: 60,000 COLONIES/ML     Performed at [facility]   Culture     Final   Value: STAPHYLOCOCCUS SPECIES (COAGULASE NEGATIVE)     Note: RIFAMPIN AND GENTAMICIN SHOULD NOT BE USED AS SINGLE DRUGS FOR TREATMENT OF STAPH INFECTIONS.     Performed at [facility]   Report Status 10/28/2012 FINAL   Final   Organism ID, Bacteria STAPHYLOCOCCUS SPECIES (COAGULASE NEGATIVE)   Final     Scheduled Meds: . feeding supplement  237 mL Oral BID BM  . FLUoxetine  20 mg Oral Q1200  . hydrocortisone  25 mg Rectal BID  . hydrocortisone-pramoxine   Rectal TID  . levothyroxine  75 mcg Oral QAC breakfast  . pantoprazole  40 mg Oral BID  . sucralfate  1 g Oral TID AC & HS  . vancomycin  1,250 mg Intravenous Q24H   Continuous Infusions:    Debbora Presto, MD  [facility] Pager [phone]  If 7PM-7AM, please contact night-coverage [email or web address] Password [email or web address] 10/28/2012, 6:09 PM   LOS: 3 days

## 2012-10-28 NOTE — Progress Notes (Signed)
Progress Note from the Palliative Medicine Team at [facility]  Subjective: patient is alert and oriented, husband and son at bedside     Objective: Allergies  Allergen Reactions  . Cephalexin Nausea Only  . Contrast Media (Iodinated Diagnostic Agents) Rash    GIVE BENADRYL PRIOR  . Other Nausea And Vomiting and Rash    "MYCIN" DRUGS  . Sulfa Antibiotics Nausea And Vomiting and Rash   Scheduled Meds: . feeding supplement  237 mL Oral BID BM  . FLUoxetine  20 mg Oral Q1200  . hydrocortisone  25 mg Rectal BID  . hydrocortisone-pramoxine   Rectal TID  . levothyroxine  75 mcg Oral QAC breakfast  . pantoprazole  40 mg Oral BID  . sucralfate  1 g Oral TID AC & HS  . vancomycin  1,250 mg Intravenous Q24H   Continuous Infusions:  PRN Meds:.alum & mag hydroxide-simeth, diclofenac sodium, lactulose, LORazepam, ondansetron (ZOFRAN) IV, ondansetron, ondansetron, oxyCODONE, simethicone, sodium chloride, temazepam  BP 115/57  Pulse 110  Temp(Src) 98.4 F (36.9 C) (Oral)  Resp 16  Ht 5\' 5"  (1.651 m)  Wt 70.4 kg (155 lb 3.3 oz)  BMI 25.83 kg/m2  SpO2 94%   PPS:30 %    Intake/Output Summary (Last 24 hours) at 10/28/12 0848 Last data filed at 10/28/12 0743  Gross per 24 hour  Intake   1430 ml  Output      0 ml  Net   1430 ml        Physical Exam:  General: Chronically ill appearing, NAD  HEENT: Mm, no exudate, + temporal muscle wasting  Chest: Diminished in bases ,CTA  CVS: tachycardic  Abdomen: remaines distended and Firm even after yesterdays paracentesis, decreased BS  Ext: +3 BLE edema  Neuro:alert and oriented X3, engaged in conversation regarding her medical situation and her choices    Labs: CBC    Component Value Date/Time   WBC 14.4* 10/25/2012 0600   WBC 13.3* 10/13/2012 1312   RBC 3.54* 10/25/2012 0600   RBC 2.68* 10/13/2012 1312   RBC 3.57* 09/24/2011 1438   HGB 11.6* 10/25/2012 0600   HGB 9.7* 10/13/2012 1312   HCT 34.2* 10/25/2012 0600   HCT 29.3* 10/13/2012  1312   PLT 119* 10/25/2012 0600   PLT 114* 10/13/2012 1312   MCV 96.6 10/25/2012 0600   MCV 109.6* 10/13/2012 1312   MCH 32.8 10/25/2012 0600   MCH 36.4* 10/13/2012 1312   MCHC 33.9 10/25/2012 0600   MCHC 33.2 10/13/2012 1312   RDW 22.4* 10/25/2012 0600   RDW 21.8* 10/13/2012 1312   LYMPHSABS 3.0 10/25/2012 0600   LYMPHSABS 3.9* 10/13/2012 1312   MONOABS 1.7* 10/25/2012 0600   MONOABS 0.9 10/13/2012 1312   EOSABS 0.1 10/25/2012 0600   EOSABS 0.1 10/13/2012 1312   BASOSABS 0.0 10/25/2012 0600   BASOSABS 0.1 10/13/2012 1312    BMET    Component Value Date/Time   NA 129* 10/25/2012 0600   NA 135* 10/13/2012 1312   K 4.8 10/25/2012 0600   K 4.2 10/13/2012 1312   CL 99 10/25/2012 0600   CL 109* 09/07/2012 1420   CO2 23 10/25/2012 0600   CO2 23 10/13/2012 1312   GLUCOSE 116* 10/25/2012 0600   GLUCOSE 162* 10/13/2012 1312   GLUCOSE 148* 09/07/2012 1420   BUN 21 10/25/2012 0600   BUN 13.6 10/13/2012 1312   CREATININE 1.05 10/25/2012 0600   CREATININE 1.1 10/13/2012 1312   CALCIUM 10.6* 10/25/2012 0600   CALCIUM  9.5 10/13/2012 1312   GFRNONAA 52* 10/25/2012 0600   GFRAA 60* 10/25/2012 0600    CMP     Component Value Date/Time   NA 129* 10/25/2012 0600   NA 135* 10/13/2012 1312   K 4.8 10/25/2012 0600   K 4.2 10/13/2012 1312   CL 99 10/25/2012 0600   CL 109* 09/07/2012 1420   CO2 23 10/25/2012 0600   CO2 23 10/13/2012 1312   GLUCOSE 116* 10/25/2012 0600   GLUCOSE 162* 10/13/2012 1312   GLUCOSE 148* 09/07/2012 1420   BUN 21 10/25/2012 0600   BUN 13.6 10/13/2012 1312   CREATININE 1.05 10/25/2012 0600   CREATININE 1.1 10/13/2012 1312   CALCIUM 10.6* 10/25/2012 0600   CALCIUM 9.5 10/13/2012 1312   PROT 4.6* 10/25/2012 0600   PROT 5.3* 10/13/2012 1312   ALBUMIN 2.0* 10/25/2012 0600   ALBUMIN 2.6* 10/13/2012 1312   AST 120* 10/25/2012 0600   AST 85* 10/13/2012 1312   ALT 32 10/25/2012 0600   ALT 17 10/13/2012 1312   ALKPHOS 171* 10/25/2012 0600   ALKPHOS 133 10/13/2012 1312   BILITOT 5.7* 10/25/2012 0600   BILITOT 3.35* 10/13/2012 1312   GFRNONAA 52*  10/25/2012 0600   GFRAA 60* 10/25/2012 0600        Assessment and Plan: 1. Code Status: DNR/DNI-comfort is main focus of care 2. Symptom Control:            Ascites: evaluation in am for permament paracentesis tap to remove continued fluid buidup prior to                           transition home             Anxiety/Agitation: Ativan 1 mg every 6 hrs PO prn             Pain: Oxycodone 5 mg PO every 3 hrs prn             Diarrhea: lactulose , prn only   3. Psycho/Social: Emotional support offered to patient and family, all are planning for anticipated dc home with home hospice services  4. Disposition:  Eventual home with hospice services   Lorinda Creed NP  Palliative Medicine Team Team Phone # [phone] Pager [phone]   1

## 2012-10-28 NOTE — Progress Notes (Signed)
Patient ID: Kelli [name], female   DOB: 02/01/1941, 72 y.o.   MRN: [id number] Request received for placement of a tunneled peritoneal catheter in pt with metastatic breast carcinoma and recurrent ascites. Additional PMH as below. Pt has been recently treated for staph UTI. Exam: pt awake/alert; chest- dim BS bases; heart- tachy but regular; abd- dist/tense, few BS, diffusely tender; ext- 2+ edema bilat.    Filed Vitals:   10/27/12 0525 10/27/12 1352 10/27/12 2216 10/28/12 0437  BP: 109/48 104/43 111/56 115/57  Pulse: 107 104 105 110  Temp: 98.4 F (36.9 C) 98.7 F (37.1 C) 98.2 F (36.8 C) 98.4 F (36.9 C)  TempSrc: Oral Oral Oral Oral  Resp: 18 18 16 16   Height:      Weight: 161 lb 6 oz (73.2 kg)   155 lb 3.3 oz (70.4 kg)  SpO2: 95% 93% 94% 94%   Past Medical History  Diagnosis Date  . Thyroid disease IN 1982    HX IODINE-131 ABLATION FOR HYPERTHYROIDISM  . Osteoporosis   . Vitamin D insufficiency   . Chronic fatigue   . Depression   . Anxiety   . Insomnia   . History of chemotherapy     Adriamycin/cytoxan followed by [facility]  08/2000/tamoxifen 06/2001-02/2003,then arimidex 02/2003-08/2009  . 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 History of radiation therapy 12/28/00-02/12/01    left breast  With Dr. Jamie Kato  . Hypothyroidism   . Fatigue   . Short of breath on exertion   . Breast cancer, left JUNE OF 2002    LEFT BREAST, ER/PR +, HER 2 -  . 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 Metastatic cancer to liver 09/24/11    CURRENTLY RECEIVING RADIATION  . Radiation 10/15/11-10/28/2011    left proximal leg  . Hydronephrosis, left     SECONDARY TO RETROPERITONEAL METS   Past Surgical History  Procedure Laterality Date  . Cholecystectomy  1970  . Femur im nail  09/24/2011     Procedure: INTRAMEDULLARY (IM) NAIL FEMORAL;  Surgeon: Toni Arthurs, MD;  Location: [facility];  Service: Orthopedics;  Laterality: Left;  FEMURAL NECK RX/ INTERTROCHANTERIC  PATHOLOGIC FX  . Bone biopsy  09/25/2011    left medullary canal femur reaming=Metastatic Carcinoma  . Hysteroscopy w/d&c  01-11-2003    REMOVAL POLYP  . Left partial mastectomy / sentinel lymph node bx  08-28-2000    LEFT BREAST CANCER  . Cysto/ left retrograde pyleogram/  left ureteral stent placement  10-30-2011  . Cystoscopy w/ ureteral stent placement Left 07/22/2012    Procedure: CYSTOSCOPY WITH STENT REPLACEMENT;  Surgeon: Marcine Matar, MD;  Location: [facility];  Service: Urology;  Laterality: Left;   US Paracentesis  10/26/2012   *RADIOLOGY REPORT*  Clinical Data: Recurrent ascites.  ULTRASOUND GUIDED PARACENTESIS  Comparison:  Previous paracentesis  An ultrasound guided paracentesis was thoroughly discussed with the patient and questions answered.  The benefits, risks, alternatives and complications were also discussed.  The patient understands and wishes to proceed with the procedure.  Written consent was obtained.  Ultrasound was performed to localize and mark an adequate pocket of fluid in the right lower quadrant of the abdomen.  The area was then prepped and draped in the normal sterile fashion.  1% Lidocaine was used for local anesthesia.  Under ultrasound guidance a 19 gauge Yueh catheter was introduced.  Paracentesis was performed.  The catheter was removed and a dressing applied.  Complications:  None  Findings:  A total of approximately 4.5 liters of cloudy yellow fluid was removed.  A fluid sample was not sent for laboratory analysis.  IMPRESSION: Successful ultrasound guided paracentesis yielding 4.5 liters of ascites.  Read by Brayton El PA-C   Original Report Authenticated By: D. Andria Rhein, MD   US Paracentesis  10/20/2012   *RADIOLOGY REPORT*  Clinical Data: Ascites  ULTRASOUND GUIDED PARACENTESIS  An ultrasound guided paracentesis was thoroughly discussed with the patient and questions answered.  The benefits, risks, alternatives and complications were  also discussed.  The patient understands and wishes to proceed with the procedure.  Written consent was obtained.  Ultrasound was performed to localize and mark an adequate pocket of fluid in the left lower quadrant of the abdomen.  The area was then prepped and draped in the normal sterile fashion.  1% Lidocaine was used for local anesthesia.  Under ultrasound guidance a 19 gauge Yueh catheter was introduced.  Paracentesis was performed.  The catheter was removed and a dressing applied.  Complications:  none  Findings:  A total of approximately 5.1 liters of turbid yellow fluid was removed.  A fluid sample was not sent for laboratory analysis.  IMPRESSION: Successful ultrasound guided paracentesis yielding 5.1 liters of ascites.  Read By:  Pattricia Boss PA-C   Original Report Authenticated By: Jolaine Click, M.D.   US Paracentesis  10/07/2012   *RADIOLOGY REPORT*  Clinical Data: Metastatic breast cancer, recurrent ascites. Request is made for therapeutic paracentesis.  ULTRASOUND GUIDED  THERAPEUTIC PARACENTESIS  An ultrasound guided paracentesis was thoroughly discussed with the patient and questions answered.  The benefits, risks, alternatives and complications were also discussed.  The patient understands and wishes to proceed with the procedure.  Written consent was obtained.  Ultrasound was performed to localize and mark an adequate pocket of fluid in the left lower quadrant of the abdomen.  The area was then prepped and draped in the normal sterile fashion.  1% Lidocaine was used for local anesthesia.  Under ultrasound guidance a 19 gauge Yueh catheter was introduced.  Paracentesis was performed.  The catheter was removed and a dressing applied.  Complications:  none  Findings:  A total of approximately 3.5 liters of slightly turbid, yellow fluid was removed.  IMPRESSION: Successful ultrasound guided therapeutic paracentesis yielding 3.5 liters of ascites.  Read by: Jeananne Rama, P.A.-C.   Original Report  Authenticated By: Tacey Ruiz, MD   Dg Chest Port 1 View  10/25/2012   *RADIOLOGY REPORT*  Clinical Data: Weakness  PORTABLE CHEST - 1 VIEW  Comparison: Prior radiograph from 10/18/2012  Findings: Right-sided Port-A-Cath is unchanged.  Cardiac and mediastinal silhouettes are stable in size and contour, and remain within normal limits. Multiple surgical clips overlie the left axilla, unchanged.  The lungs are mildly hypoinflated. Bibasilar linear opacities are similar to prior exam, and likely represent atelectasis or scarring.  No acute infiltrate or pulmonary edema is identified. Remote fracture the right seventh rib is again noted.  No pneumothorax.  IMPRESSION: Stable exam with no acute cardiopulmonary process identified.   Original Report Authenticated By: Rise Mu, M.D.   Dg Chest Port 1 View  10/18/2012   *RADIOLOGY REPORT*  Clinical Data: Weakness, rectal bleeding  PORTABLE CHEST - 1 VIEW  Comparison: 09/01/2012  Findings: Cardiomediastinal silhouette is stable.  Study is limited by poor inspiration.  Surgical clips in left axilla again noted. No acute infiltrate or pulmonary edema.  Old fracture of the  right seventh rib. There is a right IJ Port-A-Cath with tip in SVC right atrium junction.  No diagnostic pneumothorax.  IMPRESSION:  Study is limited by poor inspiration.  Surgical clips in left axilla again noted. No acute infiltrate or pulmonary edema.  Old fracture of the right seventh rib. There is a right IJ Port-A-Cath with tip in SVC right atrium junction.  No diagnostic pneumothorax.   Original Report Authenticated By: Natasha Mead, M.D.  Results for orders placed during the hospital encounter of 10/25/12  URINE CULTURE      Result Value Range   Specimen Description URINE, CLEAN CATCH     Special Requests NONE     Culture  Setup Time       Value: 10/25/2012 10:04     Performed at [facility] Count       Value: 60,000 COLONIES/ML     Performed at [facility]   Culture       Value: STAPHYLOCOCCUS SPECIES (COAGULASE NEGATIVE)     Note: RIFAMPIN AND GENTAMICIN SHOULD NOT BE USED AS SINGLE DRUGS FOR TREATMENT OF STAPH INFECTIONS.     Performed at [facility]   Report Status 10/28/2012 FINAL     Organism ID, Bacteria STAPHYLOCOCCUS SPECIES (COAGULASE NEGATIVE)    APTT      Result Value Range   aPTT 45 (*) 24 - 37 seconds  CBC WITH DIFFERENTIAL      Result Value Range   WBC 14.4 (*) 4.0 - 10.5 K/uL   RBC 3.54 (*) 3.87 - 5.11 MIL/uL   Hemoglobin 11.6 (*) 12.0 - 15.0 g/dL   HCT 02.7 (*) 25.3 - 66.4 %   MCV 96.6  78.0 - 100.0 fL   MCH 32.8  26.0 - 34.0 pg   MCHC 33.9  30.0 - 36.0 g/dL   RDW 40.3 (*) 47.4 - 25.9 %   Platelets 119 (*) 150 - 400 K/uL   Neutrophils Relative % 66  43 - 77 %   Lymphocytes Relative 21  12 - 46 %   Monocytes Relative 12  3 - 12 %   Eosinophils Relative 1  0 - 5 %   Basophils Relative 0  0 - 1 %   Neutro Abs 9.6 (*) 1.7 - 7.7 K/uL   Lymphs Abs 3.0  0.7 - 4.0 K/uL   Monocytes Absolute 1.7 (*) 0.1 - 1.0 K/uL   Eosinophils Absolute 0.1  0.0 - 0.7 K/uL   Basophils Absolute 0.0  0.0 - 0.1 K/uL   RBC Morphology POLYCHROMASIA PRESENT    COMPREHENSIVE METABOLIC PANEL      Result Value Range   Sodium 129 (*) 135 - 145 mEq/L   Potassium 4.8  3.5 - 5.1 mEq/L   Chloride 99  96 - 112 mEq/L   CO2 23  19 - 32 mEq/L   Glucose, Bld 116 (*) 70 - 99 mg/dL   BUN 21  6 - 23 mg/dL   Creatinine, Ser 5.63  0.50 - 1.10 mg/dL   Calcium 87.5 (*) 8.4 - 10.5 mg/dL   Total Protein 4.6 (*) 6.0 - 8.3 g/dL   Albumin 2.0 (*) 3.5 - 5.2 g/dL   AST 643 (*) 0 - 37 U/L   ALT 32  0 - 35 U/L   Alkaline Phosphatase 171 (*) 39 - 117 U/L   Total Bilirubin 5.7 (*) 0.3 - 1.2 mg/dL   GFR calc non Af Amer 52 (*) >90 mL/min  GFR calc Af Amer 60 (*) >90 mL/min  PROTIME-INR      Result Value Range   Prothrombin Time 22.7 (*) 11.6 - 15.2 seconds   INR 2.08 (*) 0.00 - 1.49  URINALYSIS, ROUTINE W REFLEX MICROSCOPIC      Result Value  Range   Color, Urine RED (*) YELLOW   APPearance TURBID (*) CLEAR   Specific Gravity, Urine 1.029  1.005 - 1.030   pH 5.0  5.0 - 8.0   Glucose, UA NEGATIVE  NEGATIVE mg/dL   Hgb urine dipstick LARGE (*) NEGATIVE   Bilirubin Urine LARGE (*) NEGATIVE   Ketones, ur NEGATIVE  NEGATIVE mg/dL   Protein, ur 161 (*) NEGATIVE mg/dL   Urobilinogen, UA 2.0 (*) 0.0 - 1.0 mg/dL   Nitrite POSITIVE (*) NEGATIVE   Leukocytes, UA LARGE (*) NEGATIVE  LACTIC ACID, PLASMA      Result Value Range   Lactic Acid, Venous 1.5  0.5 - 2.2 mmol/L  AMMONIA      Result Value Range   Ammonia 61 (*) 11 - 60 umol/L  URINE MICROSCOPIC-ADD ON      Result Value Range   WBC, UA TOO NUMEROUS TO COUNT  <3 WBC/hpf   RBC / HPF TOO NUMEROUS TO COUNT  <3 RBC/hpf   Bacteria, UA FEW (*) RARE   Crystals CA OXALATE CRYSTALS (*) NEGATIVE  TROPONIN I      Result Value Range   Troponin I <0.30  <0.30 ng/mL  TSH      Result Value Range   TSH 0.668  0.350 - 4.500 uIU/mL  TYPE AND SCREEN      Result Value Range   ABO/RH(D) A POS     Antibody Screen NEG     Sample Expiration 10/28/2012     A/P: Pt with hx metastatic breast cancer and recurrent ascites; awaiting d/c home with hospice services. Tent plan is for placement of a tunneled peritoneal catheter on 8/8. Will check repeat PT/INR in am (last reported as PT 22.7  INR 2.08 8/4); pt may need vit K, possibly FFP preprocedure. Details/risks of procedure d/w pt/husband with their understanding and consent.

## 2012-10-29 ENCOUNTER — Inpatient Hospital Stay (HOSPITAL_COMMUNITY): Payer: Medicare Other

## 2012-10-29 LAB — URINALYSIS, ROUTINE W REFLEX MICROSCOPIC
Glucose, UA: NEGATIVE mg/dL
Nitrite: POSITIVE — AB
Specific Gravity, Urine: 1.031 — ABNORMAL HIGH (ref 1.005–1.030)
pH: 5.5 (ref 5.0–8.0)

## 2012-10-29 LAB — CBC
HCT: 31.4 % — ABNORMAL LOW (ref 36.0–46.0)
Hemoglobin: 10.2 g/dL — ABNORMAL LOW (ref 12.0–15.0)
RBC: 3.19 MIL/uL — ABNORMAL LOW (ref 3.87–5.11)
WBC: 17.3 10*3/uL — ABNORMAL HIGH (ref 4.0–10.5)

## 2012-10-29 LAB — PROTIME-INR: INR: 2.11 — ABNORMAL HIGH (ref 0.00–1.49)

## 2012-10-29 LAB — APTT: aPTT: 44 seconds — ABNORMAL HIGH (ref 24–37)

## 2012-10-29 LAB — URINE MICROSCOPIC-ADD ON

## 2012-10-29 MED ORDER — FENTANYL CITRATE 0.05 MG/ML IJ SOLN
INTRAMUSCULAR | Status: AC
  Filled 2012-10-29: qty 6

## 2012-10-29 MED ORDER — HYDROMORPHONE HCL 2 MG PO TABS: 2.0000 mg | ORAL_TABLET | ORAL | Status: DC | PRN

## 2012-10-29 MED ORDER — GELATIN ABSORBABLE 12-7 MM EX MISC
CUTANEOUS | Status: AC | PRN
  Administered 2012-10-29: 1 via TOPICAL

## 2012-10-29 MED ORDER — ZOLPIDEM TARTRATE 5 MG PO TABS: 5.0000 mg | ORAL_TABLET | Freq: Every evening | ORAL | Status: DC | PRN

## 2012-10-29 MED ORDER — LORAZEPAM 1 MG PO TABS: 1.0000 mg | ORAL_TABLET | Freq: Four times a day (QID) | ORAL | Status: DC | PRN

## 2012-10-29 MED ORDER — HYDROCODONE-ACETAMINOPHEN 5-325 MG PO TABS: 1.0000 | ORAL_TABLET | Freq: Four times a day (QID) | ORAL | Status: DC | PRN

## 2012-10-29 MED ORDER — MIDAZOLAM HCL 2 MG/2ML IJ SOLN
INTRAMUSCULAR | Status: AC | PRN
  Administered 2012-10-29 (×3): 1 mg via INTRAVENOUS

## 2012-10-29 MED ORDER — FENTANYL CITRATE 0.05 MG/ML IJ SOLN
INTRAMUSCULAR | Status: AC | PRN
  Administered 2012-10-29: 50 ug via INTRAVENOUS
  Administered 2012-10-29: 100 ug via INTRAVENOUS

## 2012-10-29 MED ORDER — LIDOCAINE HCL 1 % IJ SOLN
INTRAMUSCULAR | Status: AC
  Filled 2012-10-29: qty 20

## 2012-10-29 MED ORDER — MIDAZOLAM HCL 2 MG/2ML IJ SOLN
INTRAMUSCULAR | Status: AC
  Filled 2012-10-29: qty 6

## 2012-10-29 MED ORDER — SODIUM CHLORIDE 0.9 % IJ SOLN: 10.0000 mL | INTRAMUSCULAR | Status: DC | PRN

## 2012-10-29 NOTE — Progress Notes (Signed)
Patient ID: Kelli [name], female   DOB: 10-01-1940, 72 y.o.   MRN: [id number] [facility] PROGRESS NOTE  Kelli [name] WUJ:[id number] DOB: Nov 08, 1940 DOA: 10/25/2012 PCP: Kelli Mask, MD  Brief narrative:  The patient is a 72 y.o. year-old female with history of metastatic T1N3 invasive left breast cancer with extension to 13 nodes including extracapsular extension, ER + 67%, PR 39%, HER-2/neu negative s/p lumpectomy, 18 node axillary dissection, adjuvant adriamycin/cytoxan followed by [facility] chemotherapy and local radiation. She has mets to liver, bone, peritoneum, retroperitoneal space. 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 She underwent paracentesis and was hospitalized for several days to recover from acute kidney injury. She was followed by oncology. She was discharged home and became progressively weaker. She had a fall at home and her husband was not able to lift her. She has been confused and had increased abdominal and lower extremity swelling. She has not had a BM since last discharge which was 2 days prior to this admission.   In the ER, labs were notable for sodium 129, creatinine 1.05, calcium 10.6, albumin 2, AST 120, ALT 32, total bilirubin 5.7, ammonia level 61, INR 2, WBC 14.4, hgb 11.6, plt 119, UA with persistent elevated WBC and now positive for nitrite as well as LE.   Principal Problem:  UTI (urinary tract infection)  - Previously being treated for Enterobacter UTI but seems to have progressed on ciprofloxacin  - Prelim cultures positive for coag-negative staph  - Continue vancomycin day 3 and plan to transition to by mouth in the morning  Active Problems:  Leukocytosis - unclear etiology, UA and urine culture ordered - Pt is currently on vancomycin day #3 Hypotension  - BP soft but overall stable   - albumin was given with paracentesis  Breast cancer  - was Dr. Darrold Span pt but care transferred to Dr. Milta Deiters care  - will notify her of pt's admission and plan for transition to comfort care  Hypothyroidism  - continue Synthroid  Lower GI bleed secondary to hemorrhoids  - no acute bleeding evident by attending nurse or family member  - no further blood draws requested to ensure pt's comfort  - will respect pt and family wishes  - continue as needed bowel regimen  Coagulopathy in the setting of cirrhosis of liver  - With recurrent and rapidly reaccumulating ascites  - Plan on drain placement today  Severe malnutrition  - secondary to progressive failure to thrive in the setting of progressive malignancy  - encouraged PO intake as pt able to tolerate as well as nutritional supplementation  Hyponatremia  - secondary to volume status  - no further blood draws per family and pt request in order to ensure comfort  Encephalopathy, hepatic  - continue Lactulose PRN  Lower extremity edema in the setting of malignant ascites  - status post paracentesis on 8/5, 4.5 L of fluid removed  - LE edema improving  Palliative care encounter  - pt and family wants pt to go home with hospice and plan is for pleurex cath to be placed for symptom relief  Pain, generalized  - pt appears to be comfortable this AM, continue current analgesia as needed   Code Status: DNR  Family Communication: spoke with patient and her husband at bedside  Disposition Plan: plan for d/c home in AM with hospice   Consultants:  Palliative care Procedures:  Paracentesis 8/5 --> 4.5 liters of ascitic fluid removed successfully  Antibiotics:  Zosyn 8/4 >> 8/6  Vancomycin 8/6 >>  HPI/Subjective: No events overnight.   Objective: Filed Vitals:   10/29/12 1609 10/29/12 1618 10/29/12 1620 10/29/12 1624  BP: 107/49 105/45 101/47 112/49  Pulse: 101 102 102 95  Temp:      TempSrc:      Resp: 16 10 11 12   Height:       Weight:      SpO2: 97% 96% 97% 96%    Intake/Output Summary (Last 24 hours) at 10/29/12 1650 Last data filed at 10/29/12 1401  Gross per 24 hour  Intake    240 ml  Output     30 ml  Net    210 ml    Exam:   General:  Pt is alert, follows commands appropriately, not in acute distress, jaundiced skin   Cardiovascular: Regular rate and rhythm, S1/S2, no murmurs, no rubs, no gallops  Respiratory: Clear to auscultation bilaterally, no wheezing, no crackles, no rhonchi  Abdomen: Soft, non tender, distended, bowel sounds present, no guarding  Extremities: +1 bilateral pitting edema, pulses DP and PT palpable bilaterally  Data Reviewed: Basic Metabolic Panel:  Recent Labs Lab 10/25/12 0600  NA 129*  K 4.8  CL 99  CO2 23  GLUCOSE 116*  BUN 21  CREATININE 1.05  CALCIUM 10.6*   Liver Function Tests:  Recent Labs Lab 10/25/12 0600  AST 120*  ALT 32  ALKPHOS 171*  BILITOT 5.7*  PROT 4.6*  ALBUMIN 2.0*    Recent Labs Lab 10/25/12 0600  AMMONIA 61*   CBC:  Recent Labs Lab 10/25/12 0600 10/29/12 0355  WBC 14.4* 17.3*  NEUTROABS 9.6*  --   HGB 11.6* 10.2*  HCT 34.2* 31.4*  MCV 96.6 98.4  PLT 119* 98*   Cardiac Enzymes:  Recent Labs Lab 10/25/12 0600  TROPONINI <0.30   Recent Results (from the past 240 hour(s))  URINE CULTURE     Status: None   Collection Time    10/25/12  5:20 AM      Result Value Range Status   Specimen Description URINE, CLEAN CATCH   Final   Special Requests NONE   Final   Culture  Setup Time     Final   Value: 10/25/2012 10:04     Performed at [facility] Count     Final   Value: 60,000 COLONIES/ML     Performed at [facility]   Culture     Final   Value: STAPHYLOCOCCUS SPECIES (COAGULASE NEGATIVE)     Note: RIFAMPIN AND GENTAMICIN SHOULD NOT BE USED AS SINGLE DRUGS FOR TREATMENT OF STAPH INFECTIONS.     Performed at [facility]   Report Status 10/28/2012 FINAL   Final   Organism  ID, Bacteria STAPHYLOCOCCUS SPECIES (COAGULASE NEGATIVE)   Final     Scheduled Meds: . feeding supplement  237 mL Oral BID BM  . FLUoxetine  20 mg Oral Q1200  . hydrocortisone  25 mg Rectal BID  . hydrocortisone-pramoxine   Rectal TID  . levothyroxine  75 mcg Oral QAC breakfast  . pantoprazole  40 mg Oral BID  . sucralfate  1 g Oral TID AC & HS  . vancomycin  1,250 mg Intravenous Q24H   Continuous Infusions:    Debbora Presto, MD  [facility] Pager [phone]  If 7PM-7AM, please contact night-coverage [email or web address] Password [email or web address]  10/29/2012, 4:50 PM   LOS: 4 days

## 2012-10-29 NOTE — Progress Notes (Signed)
Discussion with primary service. WBC elevated of uncertain etiology. Afebrile and no other obvious sources of infection. UTI is currently treated appropriately. Primary service feels there are no acute or untreated infections at present time and feels pt is stable to proceed with placement of tunneled peritoneal drain. Repeat UA is pending, but if pt is chronically colonized, results may still be abnormal.  IR will proceed with placement of tunneled peritoneal drain. Pt will be stable for discharge after procedure.

## 2012-10-29 NOTE — Progress Notes (Signed)
Patient to go to hospice home of [place] today after she receives the drain. Nurse to call report to # [phone] and fax D/C summary to [phone].  Kelli [name] C. Colt Martelle MSW, LCSW [phone]

## 2012-10-29 NOTE — Progress Notes (Signed)
Agree with PA note.  WBC elevated today to 17k - will assess for any signs of active or untreated infection before proceeding with tunneled peritoneal catheter placement.   Signed,  Sterling Big, MD [facility]

## 2012-10-30 LAB — URINE CULTURE
Colony Count: NO GROWTH
Culture: NO GROWTH

## 2012-10-30 MED ORDER — LACTULOSE 10 GM/15ML PO SOLN: 30.0000 g | Freq: Every day | ORAL | Status: AC | PRN

## 2012-10-30 MED ORDER — HYDROCODONE-ACETAMINOPHEN 5-325 MG PO TABS: 1.0000 | ORAL_TABLET | Freq: Four times a day (QID) | ORAL | Status: AC | PRN

## 2012-10-30 MED ORDER — NITROFURANTOIN MONOHYD MACRO 100 MG PO CAPS: 100.0000 mg | ORAL_CAPSULE | Freq: Two times a day (BID) | ORAL | Status: AC

## 2012-10-30 MED ORDER — HYDROMORPHONE HCL 2 MG PO TABS: 2.0000 mg | ORAL_TABLET | ORAL | Status: AC | PRN

## 2012-10-30 MED ORDER — ZOLPIDEM TARTRATE 5 MG PO TABS: 5.0000 mg | ORAL_TABLET | Freq: Every evening | ORAL | Status: AC | PRN

## 2012-10-30 MED ORDER — LORAZEPAM 1 MG PO TABS: 1.0000 mg | ORAL_TABLET | Freq: Four times a day (QID) | ORAL | Status: AC | PRN

## 2012-10-30 NOTE — Discharge Summary (Signed)
Physician Discharge Summary  Kelli [name]:[id number] DOB: 23-Aug-1940 DOA: 10/25/2012  PCP: [name], MD  Admit date: 10/25/2012 Discharge date: 10/30/2012  Recommendations for Outpatient Follow-up:  1. Please note that pt was discharged on Nitrofurantoin to complete therapy for UTI for 5 more days post discharge  2. Pt had drain placed for ascites, please evaluate clinically to ensure it looks stable  Discharge Diagnoses: UTI Principal Problem:   UTI (urinary tract infection) Active Problems:   Breast cancer   Anemia   Hypothyroidism   Lower GI bleed   Coagulopathy   Cirrhosis of liver   Recurrent falls   Encephalopathy, hepatic   Lower extremity edema   Ascites   Palliative care encounter   Pain, generalized   Weakness generalized  Discharge Condition: Stable  Diet recommendation: Heart healthy diet discussed in details   Brief narrative:  The patient is a 72 y.o. year-old female with history of metastatic T1N3 invasive left breast cancer with extension to 13 nodes including extracapsular extension, ER + 67%, PR 39%, HER-2/neu negative s/p lumpectomy, 18 node axillary dissection, adjuvant adriamycin/cytoxan followed by CMF chemotherapy and local radiation. She has mets to liver, bone, peritoneum, retroperitoneal space. 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 She underwent paracentesis and was hospitalized for several days to recover from acute kidney injury. She was followed by oncology. She was discharged home and became progressively weaker. She had a fall at home and her husband was not able to lift her. She has been confused and had increased abdominal and lower extremity swelling. She has not had a BM since last discharge which was 2 days prior to this admission.   In the ER, labs were notable for sodium  129, creatinine 1.05, calcium 10.6, albumin 2, AST 120, ALT 32, total bilirubin 5.7, ammonia level 61, INR 2, WBC 14.4, hgb 11.6, plt 119, UA with persistent elevated WBC and now positive for nitrite as well as LE.   Principal Problem:  UTI (urinary tract infection)  - Previously being treated for Enterobacter UTI but seems to have progressed on ciprofloxacin  - Urine cultures positive for coag-negative staph sensitive to vancomycin and Nitrofurnatoin - Continue vancomycin day 4 and plan to transition to by mouth on discharge to complete therapy for 5 more days post discharge  Active Problems:  Leukocytosis  - unclear etiology, repeat UA and urine culture ordered  - Pt is currently on vancomycin day #4 - will continue current ABX regimen and will transition to PO Nitrofurantoin to complete therapy for 5 more days post discharge  Hypotension  - BP soft but overall stable  - albumin was given with paracentesis  Breast cancer  - was Dr. Darrold Span pt but care transferred to Dr. Milta Deiters care  - plan for transition to comfort care  Hypothyroidism  - continue Synthroid  Lower GI bleed secondary to hemorrhoids  - no acute bleeding evident by attending nurse or family member  - no further blood draws requested to ensure pt's comfort  - will respect pt and family wishes  - continue as needed bowel regimen  Coagulopathy in the setting of cirrhosis of liver  - With recurrent and rapidly reaccumulating ascites  - status post drain placement 10/29/2012, no post procedure complications   Severe malnutrition  - secondary to progressive failure to thrive in the setting of progressive malignancy  -  encouraged PO intake as pt able to tolerate as well as nutritional supplementation  Hyponatremia  - secondary to volume status  - no further blood draws per family and pt request in order to ensure comfort  Encephalopathy, hepatic  - continue Lactulose PRN  Lower extremity edema in the setting of malignant  ascites  - status post paracentesis on 8/5, 4.5 L of fluid removed  - LE edema improving  Palliative care encounter  - pt and family wants pt to go home with hospice and plan is for pleurex cath to be placed for symptom relief  Pain, generalized  - pt appears to be comfortable this AM, continue current analgesia as needed   Code Status: DNR  Family Communication: spoke with patient and her husband at bedside    Consultants:  Palliative care Procedures:  Paracentesis 8/5 --> 4.5 liters of ascitic fluid removed successfully  Antibiotics:  Zosyn 8/4 >> 8/6  Vancomycin 8/6 >> 10/30/2012 Nitrofurantoin 08/09 --> 5 more days post discharge    Discharge Exam: Filed Vitals:   10/30/12 0511  BP: 112/50  Pulse: 110  Temp: 98.6 F (37 C)  Resp: 12   Filed Vitals:   10/29/12 1645 10/29/12 2146 10/30/12 0500 10/30/12 0511  BP: 106/38 101/49  112/50  Pulse: 98 95  110  Temp: 97.8 F (36.6 C) 98 F (36.7 C)  98.6 F (37 C)  TempSrc: Oral Oral  Oral  Resp: 15 14  12   Height:   5\' 4"  (1.626 m)   Weight:   74.6 kg (164 lb 7.4 oz)   SpO2: 94% 95%  94%    General: Pt is alert, follows commands appropriately, not in acute distress, jaundiced skin, frail and cachectic  Cardiovascular: Regular rate and rhythm, S1/S2 +, no murmurs, no rubs, no gallops Respiratory: Clear to auscultation bilaterally, no wheezing, diminished breath sounds at bases  Abdominal: Soft, non tender, distended, bowel sounds +, no guarding Extremities: +1 bilateral pitting LE edema, no cyanosis, pulses palpable bilaterally DP and PT Neuro: Grossly nonfocal  Discharge Instructions   Future Appointments Provider Department Dept Phone   11/01/2012 3:30 PM [facility] [phone]   Patient to arrive 15 minutes prior to appointment time.       Medication List    STOP taking these medications       ciprofloxacin 500 MG tablet  Commonly known as:  CIPRO     fulvestrant 250  MG/5ML injection  Commonly known as:  FASLODEX     lidocaine-prilocaine cream  Commonly known as:  EMLA     zolendronic acid 4 MG/5ML injection  Commonly known as:  ZOMETA      TAKE these medications       diclofenac sodium 1 % Gel  Commonly known as:  VOLTAREN  Apply 2 g topically 2 (two) times daily as needed (for pain). Apply to knee     docusate sodium 100 MG capsule  Commonly known as:  COLACE  Take 100 mg by mouth daily.     feeding supplement Liqd  Take 237 mLs by mouth 2 (two) times daily between meals.     FLUoxetine 20 MG capsule  Commonly known as:  PROZAC  Take 20 mg by mouth daily at 12 noon.     GAS-X PO  Take 1 tablet by mouth daily as needed (for gas).     HYDROcodone-acetaminophen 5-325 MG per tablet  Commonly known as:  NORCO/VICODIN  Take 1  tablet by mouth every 6 (six) hours as needed for pain.     hydrocortisone 25 MG suppository  Commonly known as:  ANUSOL-HC  Place 1 suppository (25 mg total) rectally 2 (two) times daily.     hydrocortisone-pramoxine 2.5-1 % rectal cream  Commonly known as:  ANALPRAM-HC  Place rectally 3 (three) times daily.     HYDROmorphone 2 MG tablet  Commonly known as:  DILAUDID  Take 1 tablet (2 mg total) by mouth every 4 (four) hours as needed for pain.     lactulose 10 GM/15ML solution  Commonly known as:  CHRONULAC  Take 45 mLs (30 g total) by mouth daily as needed.     levothyroxine 75 MCG tablet  Commonly known as:  SYNTHROID, LEVOTHROID  Take 75 mcg by mouth every morning.     LORazepam 1 MG tablet  Commonly known as:  ATIVAN  Take 1 tablet (1 mg total) by mouth every 6 (six) hours as needed (for nausea or to relax).     MIRALAX powder  Generic drug:  polyethylene glycol powder  Take 17 g by mouth as needed.     ondansetron 8 MG disintegrating tablet  Commonly known as:  ZOFRAN-ODT  Take 8 mg by mouth every 12 (twelve) hours as needed for nausea.     pantoprazole 40 MG tablet  Commonly known as:   PROTONIX  Take 40 mg by mouth daily.     sodium chloride 0.65 % nasal spray  Commonly known as:  OCEAN  Place 1 spray into the nose as needed (to keep nasal passages moist). Use every 1-2 hours while awake to keep nasal passages moist.     sucralfate 1 G tablet  Commonly known as:  CARAFATE  Take 1 tablet (1 g total) by mouth 4 (four) times daily -  before meals and at bedtime. Can dissolve in water, for stomach     temazepam 15 MG capsule  Commonly known as:  RESTORIL  Take 1 capsule (15 mg total) by mouth at bedtime as needed for sleep.     zolpidem 5 MG tablet  Commonly known as:  AMBIEN  Take 1 tablet (5 mg total) by mouth at bedtime as needed for sleep.    Nitrofurantoin 100 mg cap BID for 5 more days post discharge       Follow-up Information   Follow up with [name], MD. (As needed if symptoms worsen)    Contact information:   [address] [place] [address] [phone]        The results of significant diagnostics from this hospitalization (including imaging, microbiology, ancillary and laboratory) are listed below for reference.     Microbiology: Recent Results (from the past 240 hour(s))  URINE CULTURE     Status: None   Collection Time    10/25/12  5:20 AM      Result Value Range Status   Specimen Description URINE, CLEAN CATCH   Final   Special Requests NONE   Final   Culture  Setup Time     Final   Value: 10/25/2012 10:04     Performed at [facility] Count     Final   Value: 60,000 COLONIES/ML     Performed at [facility]   Culture     Final   Value: STAPHYLOCOCCUS SPECIES (COAGULASE NEGATIVE)     Note: RIFAMPIN AND GENTAMICIN SHOULD NOT BE USED AS SINGLE DRUGS FOR TREATMENT OF  STAPH INFECTIONS.     Performed at [facility]   Report Status 10/28/2012 FINAL   Final   Organism ID, Bacteria STAPHYLOCOCCUS SPECIES (COAGULASE NEGATIVE)   Final     Labs: Basic Metabolic Panel:  Recent  Labs Lab 10/25/12 0600  NA 129*  K 4.8  CL 99  CO2 23  GLUCOSE 116*  BUN 21  CREATININE 1.05  CALCIUM 10.6*   Liver Function Tests:  Recent Labs Lab 10/25/12 0600  AST 120*  ALT 32  ALKPHOS 171*  BILITOT 5.7*  PROT 4.6*  ALBUMIN 2.0*   No results found for this basename: LIPASE, AMYLASE,  in the last 168 hours  Recent Labs Lab 10/25/12 0600  AMMONIA 61*   CBC:  Recent Labs Lab 10/25/12 0600 10/29/12 0355  WBC 14.4* 17.3*  NEUTROABS 9.6*  --   HGB 11.6* 10.2*  HCT 34.2* 31.4*  MCV 96.6 98.4  PLT 119* 98*   Cardiac Enzymes:  Recent Labs Lab 10/25/12 0600  TROPONINI <0.30   BNP: BNP (last 3 results) No results found for this basename: PROBNP,  in the last 8760 hours CBG: No results found for this basename: GLUCAP,  in the last 168 hours   SIGNED: Time coordinating discharge: Over 30 minutes  Debbora Presto, MD  [facility] 10/30/2012, 6:16 AM Pager [phone]  If 7PM-7AM, please contact night-coverage [email or web address] Password [email or web address]

## 2012-10-30 NOTE — Progress Notes (Signed)
Confirmed with Diannia Ruder at [facility] that they still have a bed for Pt.  Faxed d/c summary, as requested.  Facility ready to receive Pt.  Notified Pt and son, at bedside.  Arranged for ambulance transport.  Pt to be d/c'd.  Providence Crosby, LCSWA Clinical Social Work [phone]

## 2012-10-30 NOTE — Progress Notes (Signed)
Pt discharged to hospice. Left unit on stretcher pushed by ambulance personnel. Left accompanied by husband and other family members. Left in good condition. Joe, hospice nurse called and  updated about pt's status. Said he was okay and did not need further detailed report. Right chest port flushed and heparinzed per protocol.  Port deaccessed; needle intact. No bleeding noted.  2 x 2 gauze and bandaid applied. Pt tolerated port deaccess. Vwilliams,rn.

## 2012-11-01 ENCOUNTER — Inpatient Hospital Stay (HOSPITAL_COMMUNITY): Payer: Medicare Other

## 2012-11-10 ENCOUNTER — Telehealth: Payer: Self-pay | Admitting: *Deleted

## 2012-11-10 NOTE — Telephone Encounter (Signed)
Spouse called requesting codes.  Reports Kelli [name] passed away on 2012-11-30.  She has a cancer policy that requires these codes.  The Billing Department instructed him to call this office.  Will notify [facility].  Darwin Guastella can be reached at [phone].

## 2012-11-10 NOTE — Telephone Encounter (Signed)
Called spouse Molly Maduro and informed him the [facility] and the Hospital codes can be obtained from the [facility] cashiers office per [facility]. We do not have access to these codes.  For codes from services performed at the [facility] to call [facility] at [phone].

## 2012-11-18 ENCOUNTER — Encounter: Payer: Self-pay | Admitting: Specialist

## 2012-11-18 NOTE — Progress Notes (Signed)
Mrs. Bodine's husband, Nadine Counts, called today to talk about his and his wife's experience during the last month of her life.  He, in particular, expressed the desire to have known sooner that she was actively dying.  I listened to him for close to an hour and expressed support for him in his loss.  I also suggested that he take advantage of the grief counseling provided at Hospice, when he is ready.

## 2012-11-22 DEATH — deceased

## 2012-12-02 ENCOUNTER — Other Ambulatory Visit (HOSPITAL_COMMUNITY): Payer: Medicare Other

## 2013-08-10 IMAGING — CR DG KNEE COMPLETE 4+V*L*
4 series · 4 of 4 positions shown · non-contrast
Comparison: 12/20/2003.  MRI 08/10/2008.

CLINICAL DATA: Pain and swelling in the left knee.  Known
metastatic bone carcinoma.  Known arthritis.  Pain in the medial
aspect of the left knee.  No recent injury.

LEFT KNEE - COMPLETE 4+ VIEW

[t knee ap left]
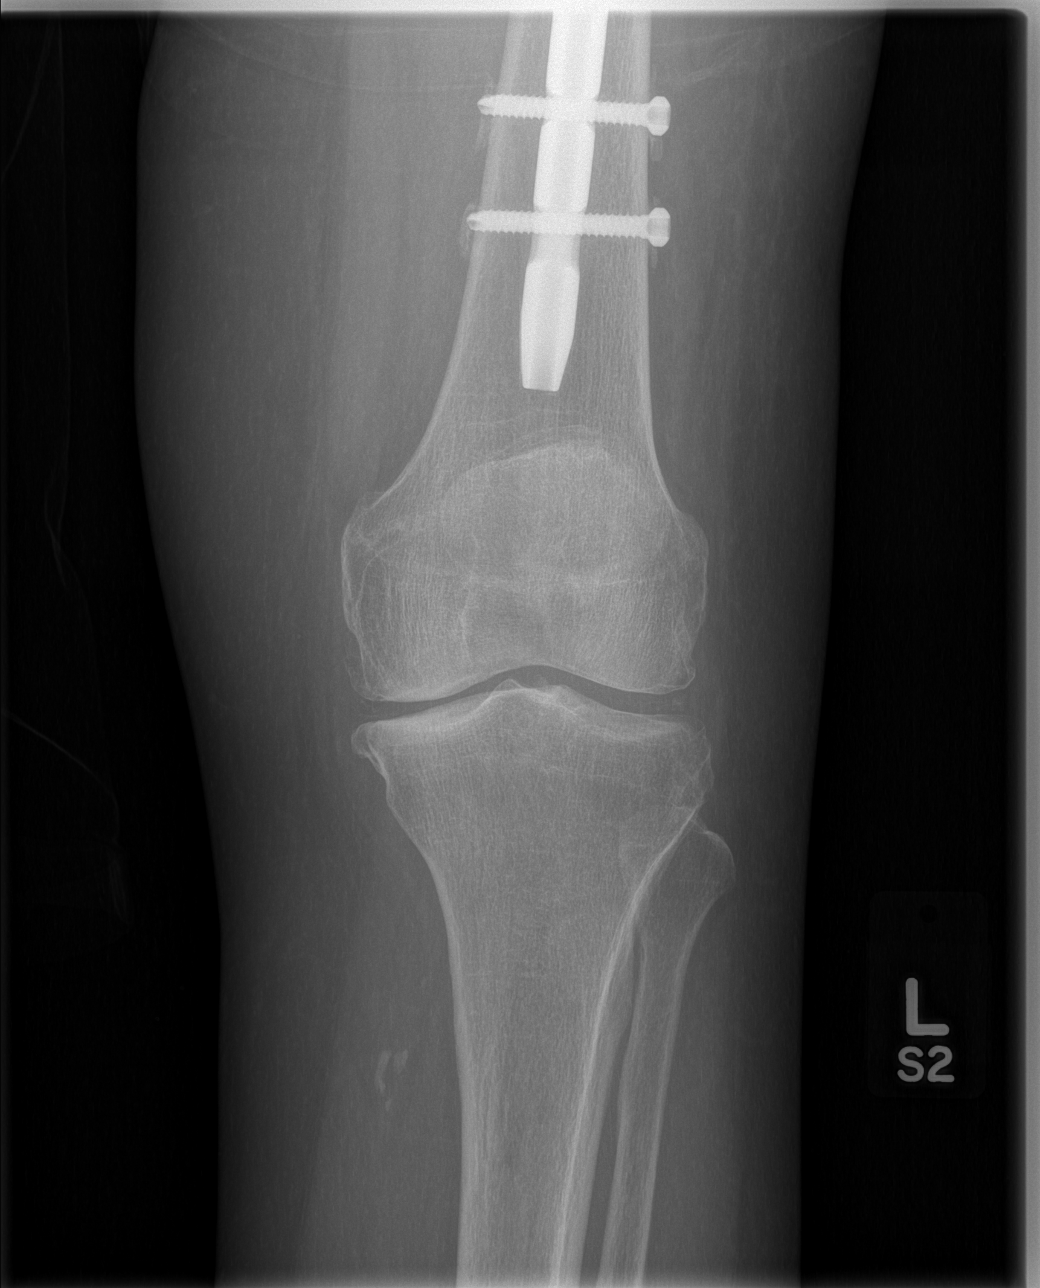

[t knee oblique left (1 of 2)]
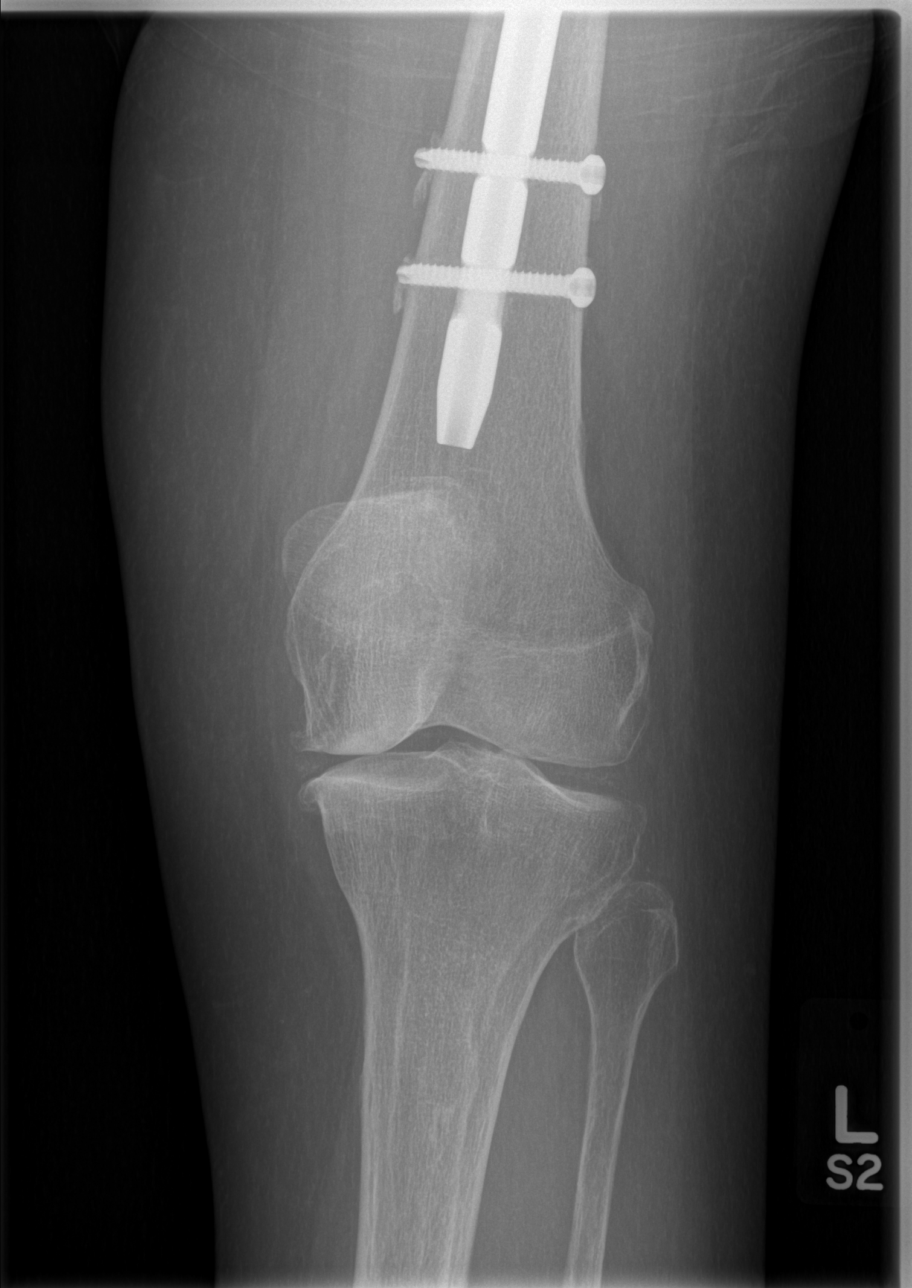

[t knee oblique left (2 of 2)]
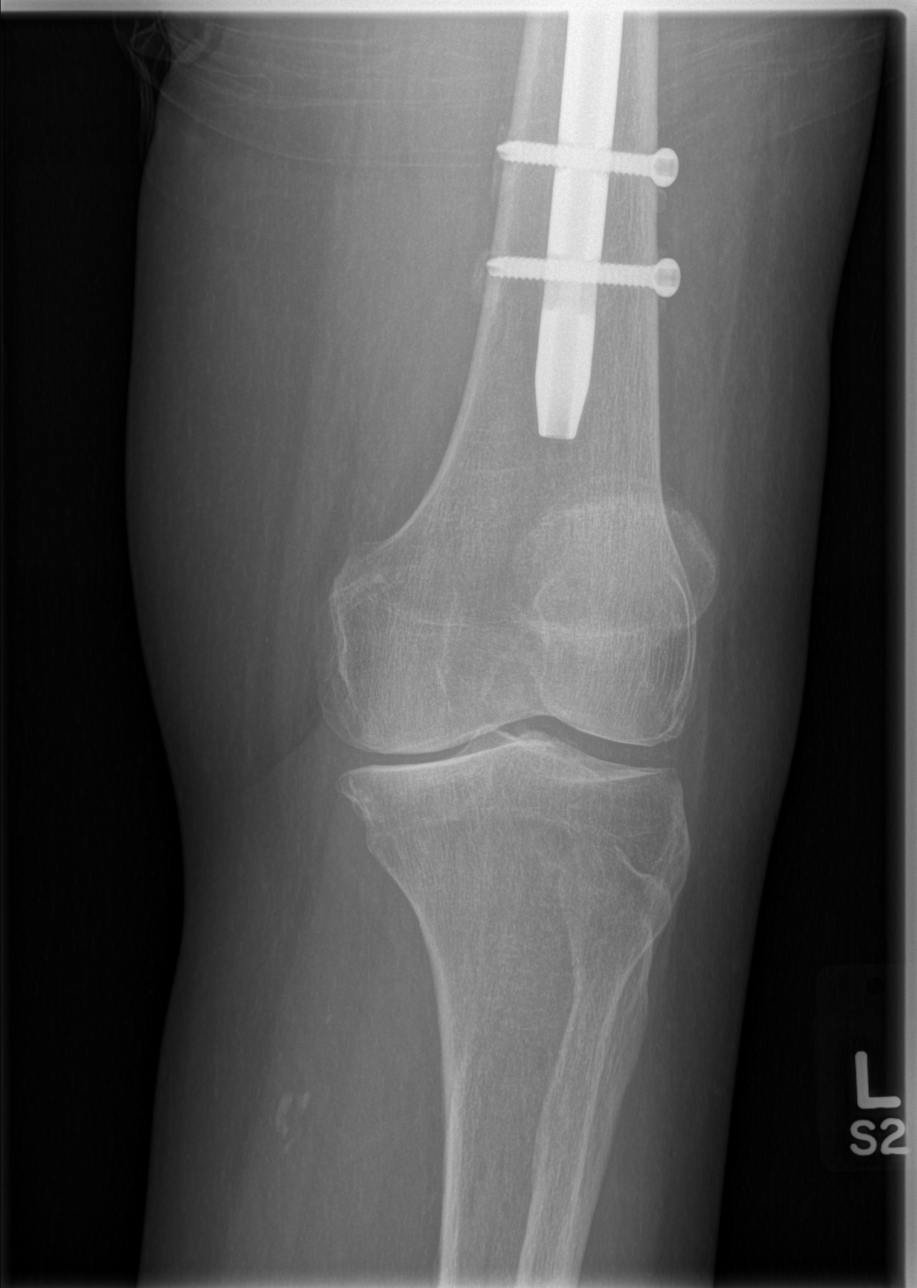

[t knee lat left]
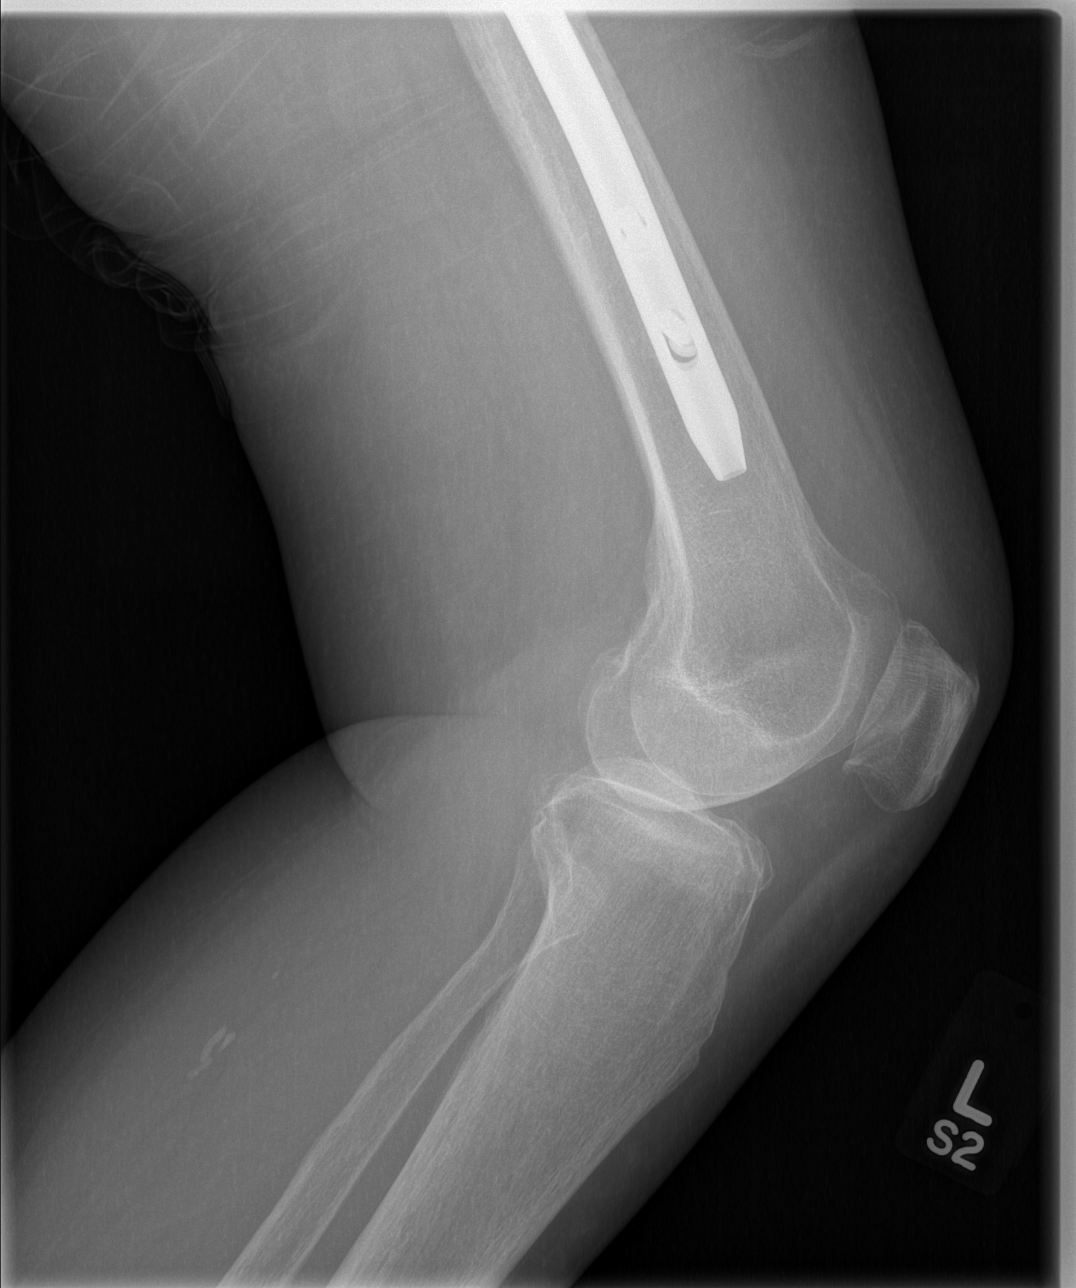

[4 of 4 positions shown; findings below may reference images not displayed]

FINDINGS: The distal portion of intramedullary rod is seen in the
femur with to distal transverse screws.  There is osteopenic
appearance of bones overall.  Nonaneurysmal arterial calcifications
are present.

There is narrowing of the medial joint space with marginal
osteophyte formation.  No definite joint effusion is seen.  There
is minimal posterior patellar marginal osteophyte formation.  No
opaque loose body is seen.  No chondrocalcinosis is evident.
IMPRESSION: Previous ORIF of femur.  No disruption of hardware is evident.
Osteopenic appearance of bones.  Nonaneurysmal arterial
calcifications.

Narrowing of the medial joint space of the the with marginal
osteophyte formation consistent with osteoarthritic changes.
Posterior patellar spurring.

## 2013-11-29 NOTE — Telephone Encounter (Signed)
Opened in error

## 2013-12-10 IMAGING — US IR PERC TUN PERIT CATH WO PORT S&I /IMAG
1 series · 1 of 1 positions shown · non-contrast
Comparison: none

IR PERCUTANEOUS TUNNELED PERITONEAL CATHETER

Date: 10/29/2012
CLINICAL HISTORY: 72-year-old female with stage IV metastatic
breast cancer, peritoneal carcinomatosis and recurrent ascites. She
presents to interventional radiology for placement of a tunneled
peritoneal drain for palliation.

[Series 1: sp perc tun perit cath wo port · 1 of 1 slices shown]
[im 1/1]
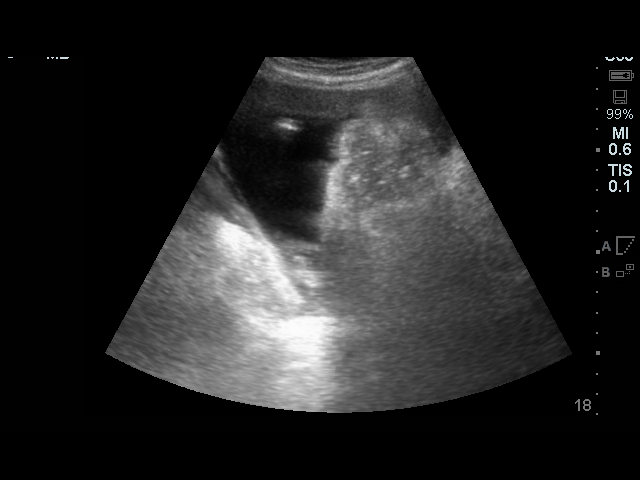

[1 of 1 positions shown; findings below may reference images not displayed]

Procedures Performed:
1. Ultrasound-guided puncture of the peritoneal cavity in the right
lower quadrant
2.  Placement of a tunneled peritoneal drain with fluoroscopic
guidance

Sedation: Moderate (conscious) sedation was used.  Three mg Versed,
150 mcg Fentanyl were administered intravenously.  The patient's
vital signs were monitored continuously by radiology nursing
throughout the procedure.

Sedation Time: 25 minutes

Fluoroscopy time: 1 minute 18 seconds

Contrast volume: None

Antibiotic Prophylaxis:  The patient is on IV vancomycin.  No
additional antibiotics administered during the procedure.

PROCEDURE/FINDINGS:

 Informed consent was obtained from the patient following
explanation of the procedure, risks, benefits and alternatives.
The patient understands, agrees and consents for the procedure.
All questions were addressed. A time out was performed.

Maximal barrier sterile technique utilized including caps, mask,
sterile gowns, sterile gloves, large sterile drape, hand hygiene,
and chlorhexidine skin prep.  The right hemiabdomen was
interrogated with ultrasound.  There is a large volume of
sonographically simple ascites.  A suitable skin entry site was
selected and marked.  Local anesthesia was attained by infiltration
with 1% lidocaine.  A small dermatotomy was made with a #11 blade.
Under direct sonographic guidance, the peritoneum was punctured
with an 18 gauge needle.  A wire was advanced into the anatomic
pelvis.

A suitable skin exit site was selected cephalad and medial to the
peritoneal entry site.  Local anesthesia was again attained by
infiltration with 1% lidocaine.  A second small dermatotomy was
made.  The catheter was then tunneled from the skin exit site to
the dermatotomy overlying the peritoneal puncture site.  The cuff
was advanced into the soft tissues.

The tract from the skin to the peritoneum was then serially dilated
and a peel-away sheath ultimately advanced over the wire.  Is there
catheter was advanced to the peel-away sheath and positioned in the
anatomic pelvis fluoroscopically.  The dermatotomy overlying the
peritoneal access site was closed with a 4-0 inverted interrupted
Monocryl suture and the epidermis sealed with Dermabond.  An 0
Prolene pursestring suture was placed around the skin exit site and
attached to the catheter.

A sterile bandage was placed.  A total of 5.1 liters of yellow
ascitic fluid was then successfully aspirated.  There was no
immediate complication, the patient tolerated the procedure well
and was returned to her room in stable condition.
IMPRESSION: Successful placement of an Aspira tunneled peritoneal drainage
catheter.  The catheter lies within the anatomic pelvis.

Aspiration of 5.1 liters of yellow serous ascitic fluid.

The retaining suture at the skin exit site can be removed in 10-14
days.

[REDACTED]
